# Patient Record
Sex: Female | Born: 1965 | Race: White | Hispanic: No | Marital: Married | Smoking: Former smoker
Health system: Southern US, Community
[De-identification: ages and names within clinical notes are randomized; demographics above are authoritative.]

## PROBLEM LIST (undated history)

## (undated) DIAGNOSIS — R945 Abnormal results of liver function studies: Principal | ICD-10-CM

## (undated) DIAGNOSIS — I2510 Atherosclerotic heart disease of native coronary artery without angina pectoris: Secondary | ICD-10-CM

## (undated) DIAGNOSIS — E785 Hyperlipidemia, unspecified: Secondary | ICD-10-CM

## (undated) DIAGNOSIS — A630 Anogenital (venereal) warts: Secondary | ICD-10-CM

## (undated) DIAGNOSIS — K635 Polyp of colon: Secondary | ICD-10-CM

## (undated) DIAGNOSIS — G43909 Migraine, unspecified, not intractable, without status migrainosus: Secondary | ICD-10-CM

## (undated) DIAGNOSIS — F329 Major depressive disorder, single episode, unspecified: Secondary | ICD-10-CM

## (undated) DIAGNOSIS — T7840XA Allergy, unspecified, initial encounter: Secondary | ICD-10-CM

## (undated) DIAGNOSIS — I10 Essential (primary) hypertension: Secondary | ICD-10-CM

## (undated) DIAGNOSIS — R7303 Prediabetes: Secondary | ICD-10-CM

## (undated) DIAGNOSIS — F32A Depression, unspecified: Secondary | ICD-10-CM

## (undated) HISTORY — DX: Anogenital (venereal) warts: A63.0

## (undated) HISTORY — DX: Allergy, unspecified, initial encounter: T78.40XA

## (undated) HISTORY — DX: Hyperlipidemia, unspecified: E78.5

## (undated) HISTORY — DX: Migraine, unspecified, not intractable, without status migrainosus: G43.909

## (undated) HISTORY — PX: HAMMER TOE SURGERY: SHX385

## (undated) HISTORY — DX: Major depressive disorder, single episode, unspecified: F32.9

## (undated) HISTORY — PX: FOOT SURGERY: SHX648

## (undated) HISTORY — DX: Depression, unspecified: F32.A

## (undated) HISTORY — DX: Prediabetes: R73.03

## (undated) HISTORY — DX: Atherosclerotic heart disease of native coronary artery without angina pectoris: I25.10

## (undated) HISTORY — DX: Polyp of colon: K63.5

## (undated) HISTORY — DX: Essential (primary) hypertension: I10

## (undated) HISTORY — PX: CHOLECYSTECTOMY: SHX55

---
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
## 2005-05-09 ENCOUNTER — Emergency Department: Payer: Self-pay | Admitting: Emergency Medicine

## 2005-05-19 ENCOUNTER — Emergency Department: Payer: Self-pay | Admitting: General Practice

## 2006-01-03 ENCOUNTER — Emergency Department: Payer: Self-pay | Admitting: Emergency Medicine

## 2007-11-16 ENCOUNTER — Ambulatory Visit: Payer: Self-pay | Admitting: Internal Medicine

## 2007-11-16 DIAGNOSIS — G43109 Migraine with aura, not intractable, without status migrainosus: Secondary | ICD-10-CM | POA: Insufficient documentation

## 2007-11-16 DIAGNOSIS — E785 Hyperlipidemia, unspecified: Secondary | ICD-10-CM

## 2007-11-16 DIAGNOSIS — I10 Essential (primary) hypertension: Secondary | ICD-10-CM

## 2007-11-18 DIAGNOSIS — J309 Allergic rhinitis, unspecified: Secondary | ICD-10-CM | POA: Insufficient documentation

## 2007-11-18 LAB — CONVERTED CEMR LAB
AST: 20 units/L (ref 0–37)
Alkaline Phosphatase: 94 units/L (ref 39–117)
Basophils Absolute: 0.1 10*3/uL (ref 0.0–0.1)
Bilirubin, Direct: 0.1 mg/dL (ref 0.0–0.3)
Calcium: 9.3 mg/dL (ref 8.4–10.5)
Chloride: 102 meq/L (ref 96–112)
Creatinine, Ser: 0.7 mg/dL (ref 0.4–1.2)
GFR calc Af Amer: 119 mL/min
GFR calc non Af Amer: 99 mL/min
LDL Cholesterol: 140 mg/dL — ABNORMAL HIGH (ref 0–99)
MCHC: 34.3 g/dL (ref 30.0–36.0)
MCV: 87.8 fL (ref 78.0–100.0)
Platelets: 232 10*3/uL (ref 150–400)
Potassium: 4.7 meq/L (ref 3.5–5.1)
TSH: 2.52 microintl units/mL (ref 0.35–5.50)
Total Bilirubin: 0.7 mg/dL (ref 0.3–1.2)
Total CHOL/HDL Ratio: 4.8
Triglycerides: 89 mg/dL (ref 0–149)
VLDL: 18 mg/dL (ref 0–40)
WBC: 8.4 10*3/uL (ref 4.5–10.5)

## 2007-11-27 ENCOUNTER — Encounter: Admission: RE | Admit: 2007-11-27 | Discharge: 2007-11-27 | Payer: Self-pay | Admitting: Internal Medicine

## 2007-12-03 ENCOUNTER — Telehealth: Payer: Self-pay | Admitting: Internal Medicine

## 2008-02-08 ENCOUNTER — Encounter: Payer: Self-pay | Admitting: Internal Medicine

## 2008-02-25 ENCOUNTER — Telehealth: Payer: Self-pay | Admitting: Internal Medicine

## 2008-03-06 ENCOUNTER — Telehealth: Payer: Self-pay | Admitting: Internal Medicine

## 2008-05-09 ENCOUNTER — Encounter: Payer: Self-pay | Admitting: Internal Medicine

## 2008-05-09 ENCOUNTER — Ambulatory Visit: Payer: Self-pay | Admitting: Internal Medicine

## 2008-05-09 ENCOUNTER — Other Ambulatory Visit: Admission: RE | Admit: 2008-05-09 | Discharge: 2008-05-09 | Payer: Self-pay | Admitting: Internal Medicine

## 2008-05-09 DIAGNOSIS — R3915 Urgency of urination: Secondary | ICD-10-CM

## 2008-05-27 ENCOUNTER — Telehealth: Payer: Self-pay | Admitting: *Deleted

## 2008-11-14 ENCOUNTER — Encounter: Payer: Self-pay | Admitting: Internal Medicine

## 2008-11-14 ENCOUNTER — Ambulatory Visit: Payer: Self-pay | Admitting: Internal Medicine

## 2008-11-14 DIAGNOSIS — F4322 Adjustment disorder with anxiety: Secondary | ICD-10-CM

## 2008-12-03 ENCOUNTER — Encounter: Admission: RE | Admit: 2008-12-03 | Discharge: 2008-12-03 | Payer: Self-pay | Admitting: Internal Medicine

## 2008-12-15 ENCOUNTER — Telehealth: Payer: Self-pay | Admitting: *Deleted

## 2009-02-10 ENCOUNTER — Telehealth: Payer: Self-pay | Admitting: *Deleted

## 2009-05-19 ENCOUNTER — Ambulatory Visit: Payer: Self-pay | Admitting: Internal Medicine

## 2009-05-19 LAB — CONVERTED CEMR LAB
Basophils Relative: 0.6 % (ref 0.0–3.0)
Bilirubin Urine: NEGATIVE
Bilirubin, Direct: 0.1 mg/dL (ref 0.0–0.3)
Cholesterol: 206 mg/dL — ABNORMAL HIGH (ref 0–200)
Eosinophils Relative: 5 % (ref 0.0–5.0)
HDL: 35 mg/dL — ABNORMAL LOW (ref >39.00)
Lymphocytes Relative: 27.7 % (ref 12.0–46.0)
Lymphs Abs: 2 10*3/uL (ref 0.7–4.0)
MCHC: 33.5 g/dL (ref 30.0–36.0)
MCV: 88 fL (ref 78.0–100.0)
Monocytes Relative: 7 % (ref 3.0–12.0)
Nitrite: NEGATIVE
Platelets: 209 10*3/uL (ref 150.0–400.0)
Potassium: 4.6 meq/L (ref 3.5–5.1)
Protein, U semiquant: NEGATIVE
RBC: 4.41 M/uL (ref 3.87–5.11)
RDW: 12.6 % (ref 11.5–14.6)
Sodium: 140 meq/L (ref 135–145)
Specific Gravity, Urine: <1.005
Total CHOL/HDL Ratio: 6
Triglycerides: 149 mg/dL (ref 0.0–149.0)
WBC Urine, dipstick: NEGATIVE

## 2009-05-26 ENCOUNTER — Ambulatory Visit: Payer: Self-pay | Admitting: Internal Medicine

## 2009-05-26 ENCOUNTER — Other Ambulatory Visit: Admission: RE | Admit: 2009-05-26 | Discharge: 2009-05-26 | Payer: Self-pay | Admitting: Internal Medicine

## 2009-05-26 DIAGNOSIS — R739 Hyperglycemia, unspecified: Secondary | ICD-10-CM

## 2009-06-09 ENCOUNTER — Telehealth: Payer: Self-pay | Admitting: *Deleted

## 2009-09-14 ENCOUNTER — Telehealth: Payer: Self-pay | Admitting: *Deleted

## 2009-09-21 ENCOUNTER — Telehealth: Payer: Self-pay | Admitting: Internal Medicine

## 2009-09-21 ENCOUNTER — Encounter: Payer: Self-pay | Admitting: Internal Medicine

## 2009-09-22 ENCOUNTER — Telehealth (INDEPENDENT_AMBULATORY_CARE_PROVIDER_SITE_OTHER): Payer: Self-pay | Admitting: *Deleted

## 2009-10-01 ENCOUNTER — Ambulatory Visit: Payer: Self-pay | Admitting: Internal Medicine

## 2009-10-01 DIAGNOSIS — R0609 Other forms of dyspnea: Secondary | ICD-10-CM

## 2009-10-01 DIAGNOSIS — R0989 Other specified symptoms and signs involving the circulatory and respiratory systems: Secondary | ICD-10-CM

## 2009-10-01 DIAGNOSIS — R0789 Other chest pain: Secondary | ICD-10-CM

## 2009-10-01 LAB — CONVERTED CEMR LAB
BUN: 11 mg/dL (ref 6–23)
Basophils Absolute: 0 10*3/uL (ref 0.0–0.1)
Basophils Relative: 0.6 % (ref 0.0–3.0)
CO2: 30 meq/L (ref 19–32)
Calcium: 9 mg/dL (ref 8.4–10.5)
Chloride: 103 meq/L (ref 96–112)
Creatinine, Ser: 0.8 mg/dL (ref 0.4–1.2)
Eosinophils Absolute: 0.2 10*3/uL (ref 0.0–0.7)
GFR calc non Af Amer: 83.52 mL/min (ref >60)
Glucose, Bld: 98 mg/dL (ref 70–99)
Lymphocytes Relative: 24.9 % (ref 12.0–46.0)
MCHC: 34.8 g/dL (ref 30.0–36.0)
Monocytes Absolute: 0.5 10*3/uL (ref 0.1–1.0)
Neutro Abs: 5 10*3/uL (ref 1.4–7.7)
Platelets: 239 10*3/uL (ref 150.0–400.0)
Potassium: 4.4 meq/L (ref 3.5–5.1)
RBC: 4.33 M/uL (ref 3.87–5.11)
RDW: 13.2 % (ref 11.5–14.6)
Sed Rate: 29 mm/hr — ABNORMAL HIGH (ref 0–22)

## 2009-10-05 ENCOUNTER — Ambulatory Visit: Payer: Self-pay | Admitting: Internal Medicine

## 2009-10-06 ENCOUNTER — Encounter: Payer: Self-pay | Admitting: Internal Medicine

## 2009-10-07 ENCOUNTER — Telehealth: Payer: Self-pay | Admitting: Internal Medicine

## 2009-11-10 ENCOUNTER — Ambulatory Visit: Payer: Self-pay | Admitting: Internal Medicine

## 2009-11-10 LAB — CONVERTED CEMR LAB
Cholesterol: 217 mg/dL — ABNORMAL HIGH (ref 0–200)
GFR calc non Af Amer: 102.44 mL/min (ref >60)
HDL: 37.4 mg/dL — ABNORMAL LOW (ref >39.00)
Potassium: 4.9 meq/L (ref 3.5–5.1)
Sodium: 140 meq/L (ref 135–145)
Triglycerides: 253 mg/dL — ABNORMAL HIGH (ref 0.0–149.0)
VLDL: 50.6 mg/dL — ABNORMAL HIGH (ref 0.0–40.0)

## 2009-11-18 ENCOUNTER — Encounter: Admission: RE | Admit: 2009-11-18 | Discharge: 2009-11-18 | Payer: Self-pay | Admitting: Internal Medicine

## 2009-11-25 ENCOUNTER — Telehealth: Payer: Self-pay | Admitting: *Deleted

## 2009-12-01 ENCOUNTER — Ambulatory Visit: Payer: Self-pay | Admitting: Internal Medicine

## 2009-12-01 DIAGNOSIS — N3941 Urge incontinence: Secondary | ICD-10-CM

## 2009-12-01 LAB — CONVERTED CEMR LAB
Blood in Urine, dipstick: NEGATIVE
Ketones, urine, test strip: NEGATIVE
Nitrite: NEGATIVE
Protein, U semiquant: NEGATIVE
WBC Urine, dipstick: NEGATIVE

## 2009-12-03 ENCOUNTER — Encounter (INDEPENDENT_AMBULATORY_CARE_PROVIDER_SITE_OTHER): Payer: Self-pay | Admitting: *Deleted

## 2010-02-17 DIAGNOSIS — R198 Other specified symptoms and signs involving the digestive system and abdomen: Secondary | ICD-10-CM

## 2010-02-22 ENCOUNTER — Ambulatory Visit: Payer: Self-pay | Admitting: Internal Medicine

## 2010-02-22 ENCOUNTER — Encounter: Payer: Self-pay | Admitting: Internal Medicine

## 2010-02-22 DIAGNOSIS — K219 Gastro-esophageal reflux disease without esophagitis: Secondary | ICD-10-CM | POA: Insufficient documentation

## 2010-02-23 LAB — CONVERTED CEMR LAB: Tissue Transglutaminase Ab, IgA: 7.2 units (ref <20)

## 2010-03-22 ENCOUNTER — Encounter: Payer: Self-pay | Admitting: Internal Medicine

## 2010-04-05 ENCOUNTER — Ambulatory Visit: Payer: Self-pay | Admitting: Internal Medicine

## 2010-04-06 ENCOUNTER — Encounter: Payer: Self-pay | Admitting: Internal Medicine

## 2010-04-28 ENCOUNTER — Ambulatory Visit: Payer: Self-pay | Admitting: Internal Medicine

## 2010-04-28 LAB — CONVERTED CEMR LAB
AST: 27 units/L (ref 0–37)
Albumin: 3.9 g/dL (ref 3.5–5.2)
Alkaline Phosphatase: 88 units/L (ref 39–117)
Basophils Absolute: 0 10*3/uL (ref 0.0–0.1)
Basophils Relative: 0.7 % (ref 0.0–3.0)
CO2: 30 meq/L (ref 19–32)
Direct LDL: 142.5 mg/dL
GFR calc non Af Amer: 76.63 mL/min (ref >60)
Glucose, Bld: 113 mg/dL — ABNORMAL HIGH (ref 70–99)
HCT: 39 % (ref 36.0–46.0)
HDL: 37 mg/dL — ABNORMAL LOW (ref >39.00)
Hemoglobin, Urine: NEGATIVE
Hemoglobin: 13.4 g/dL (ref 12.0–15.0)
Lymphocytes Relative: 25.1 % (ref 12.0–46.0)
Lymphs Abs: 1.7 10*3/uL (ref 0.7–4.0)
MCHC: 34.5 g/dL (ref 30.0–36.0)
Monocytes Relative: 6.9 % (ref 3.0–12.0)
Neutro Abs: 4.2 10*3/uL (ref 1.4–7.7)
Nitrite: NEGATIVE
Potassium: 4.8 meq/L (ref 3.5–5.1)
RBC: 4.61 M/uL (ref 3.87–5.11)
RDW: 13.5 % (ref 11.5–14.6)
Sodium: 139 meq/L (ref 135–145)
Specific Gravity, Urine: <=1.005 (ref 1.000–1.030)
TSH: 3.31 microintl units/mL (ref 0.35–5.50)
Total Protein, Urine: NEGATIVE mg/dL
Total Protein: 6.8 g/dL (ref 6.0–8.3)
Urine Glucose: NEGATIVE mg/dL
pH: 6 (ref 5.0–8.0)

## 2010-05-04 ENCOUNTER — Other Ambulatory Visit
Admission: RE | Admit: 2010-05-04 | Discharge: 2010-05-04 | Payer: Self-pay | Source: Home / Self Care | Admitting: Internal Medicine

## 2010-05-04 ENCOUNTER — Ambulatory Visit: Payer: Self-pay | Admitting: Internal Medicine

## 2010-05-10 LAB — CONVERTED CEMR LAB: Pap Smear: NEGATIVE

## 2010-06-06 DIAGNOSIS — K635 Polyp of colon: Secondary | ICD-10-CM

## 2010-06-06 HISTORY — DX: Polyp of colon: K63.5

## 2010-07-06 NOTE — Assessment & Plan Note (Signed)
Summary: CPX AND PAP/CJR   Vital Signs:  Patient profile:   45 year old female Menstrual status:  regular LMP:     04/19/2010 Height:      63.75 inches Weight:      268 pounds BMI:     46.53 Pulse rate:   60 / minute BP sitting:   110 / 60  (right arm) Cuff size:   large  Vitals Entered By: Romualdo Bolk, CMA Duncan Dull) (May 04, 2010 9:56 AM)  Nutrition Counseling: Patient's BMI is greater than 25 and therefore counseled on weight management options. CC: CPX with pap LMP (date): 04/19/2010 LMP - Character: normal Menarche (age onset years): 14   Menses interval (days): 25 Menstrual flow (days): 3 Enter LMP: 04/19/2010 Last PAP Result NEGATIVE FOR INTRAEPITHELIAL LESIONS OR MALIGNANCY.   History of Present Illness: Elizabeth Fleming comes in today  for preventive visit.  Since last visit she has less job stress and working from home.  trying to exercise and lose weight and has taken some measure to that effect .  GERD HT controlled  Urinary : neg exam by uro  on meds   ? diet change helpful also  Preventive Care Screening  Prior Values:    Pap Smear:  NEGATIVE FOR INTRAEPITHELIAL LESIONS OR MALIGNANCY. (05/26/2009)    Mammogram:  ASSESSMENT: Negative - BI-RADS 1^MM DIGITAL SCREENING (11/18/2009)    Colonoscopy:  DONE (04/05/2010)    Last Tetanus Booster:  Td (05/11/2005)   Preventive Screening-Counseling & Management  Alcohol-Tobacco     Alcohol drinks/day: 0     Smoking Status: quit     Year Quit: 1 yr 4 months  Caffeine-Diet-Exercise     Caffeine use/day: 4     Does Patient Exercise: no  Hep-HIV-STD-Contraception     Dental Visit-last 6 months yes     Sun Exposure-Excessive: no  Safety-Violence-Falls     Seat Belt Use: yes     Firearms in the Home: firearms in the home     Firearm Counseling: not indicated; uses recommended firearm safety measures     Smoke Detectors: yes  Current Medications (verified): 1)  Atenolol 25 Mg  Tabs (Atenolol) .Marland Kitchen.. 1 By  Mouth Once Daily 2)  Alprazolam 0.25 Mg  Tabs (Alprazolam) .... 1/2  To 1 By Mouth Two Times A Day As Needed For Anxiety 3)  Zyrtec Allergy 10 Mg Caps (Cetirizine Hcl) .... One Tablet By Mouth Once Daily 4)  Maalox 600 Mg Chew (Calcium Carbonate Antacid) .... Two By Mouth Once Daily 5)  Bentyl 20 Mg Tabs (Dicyclomine Hcl) .... Take 1 Tablet By Mouth Two Times A Day 6)  Toviaz 4 Mg Xr24h-Tab (Fesoterodine Fumarate) .Marland Kitchen.. 1 By Mouth Once Daily  Allergies (verified): 1)  ! Iodine  Past History:  Past medical, surgical, family and social histories (including risk factors) reviewed, and no changes noted (except as noted below).  Past Medical History: Hypertension g2p0 hx of abnormal pap 10years ago but normal colopscopy.  hx of seasonal depression   prozac x 1-2 years hx migraines and gets lov one eye but no common  rx in past with? epidrine? MIDRIN   only 2 episodes.    hx of episode of cns change  without medical evaluation that was short lived.   (didnt have insurance)wonders if was a TIA butno numbness weakness or dysarthria  Allergic rhinitis  Remote hx of  allergies in [place].  PFTS Chest x ray normal 2011 colon polyps 2012 on 5 yr  recal  Past Surgical History: Reviewed history from 11/16/2007 and no changes required. Cholecystectomy 1998 Tonsillectomy 1974  Past History:  Care Management:  Counseling-no longer seeing Dermatology: [facility]  Urology: [facility]: [facility]   Family History: Reviewed history from 02/17/2010 and no changes required. ADOPTED  not available  Social History: Reviewed history from 02/22/2010 and no changes required. 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  Alcohol Use - no Daily Caffeine Use: 3 daily   Review of Systems  The patient  denies anorexia, fever, weight loss, vision loss, decreased hearing, hoarseness, chest pain, syncope, dyspnea on exertion, peripheral edema, prolonged cough, hemoptysis, melena, hematochezia, severe indigestion/heartburn, unusual weight change, abnormal bleeding, enlarged lymph nodes, angioedema, and breast masses.         less urinary frequency   Physical Exam  General:  Well-developed,well-nourished,in no acute distress; alert,appropriate and cooperative throughout examination Head:  Normocephalic and atraumatic without obvious abnormalities. No apparent alopecia or balding. Eyes:  PERRL, EOMs full, conjunctiva clear  Ears:  R ear normal, L ear normal, and no external deformities.   Nose:  no external deformity, no external erythema, and no nasal discharge.   Mouth:  good dentition and pharynx pink and moist.   Neck:  No deformities, masses, or tenderness noted. Breasts:  No mass, nodules, thickening, tenderness, bulging, retraction, inflamation, nipple discharge or skin changes noted.   Lungs:  Normal respiratory effort, chest expands symmetrically. Lungs are clear to auscultation, no crackles or wheezes. Heart:  Normal rate and regular rhythm. S1 and S2 normal without gallop, murmur, click, rub or other extra sounds.no lifts.   Abdomen:  Bowel sounds positive,abdomen soft and non-tender without masses, organomegaly or hernias noted. Rectal:  per DR Theone Murdoch:  Pelvic Exam:        External: normal female genitalia without lesions or masses        Vagina: normal without lesions or masses        Cervix: normal without lesions or masses        Adnexa: normal bimanual exam without masses or fullness        Uterus: normal by palpation        Pap smear: performed Msk:  no joint warmth, no redness over joints, and no joint deformities.   Pulses:  pulses intact without delay   Extremities:  no clubbing cyanosis or edema  Neurologic:  alert & oriented X3, strength normal in all  extremities, and gait normal.  non f ocal  Skin:  turgor normal, color normal, no suspicious lesions, and no ecchymoses.   Cervical Nodes:  No lymphadenopathy noted Axillary Nodes:  No palpable lymphadenopathy Inguinal Nodes:  No significant adenopathy Psych:  Normal eye contact, appropriate affect. Cognition appears normal.    Impression & Recommendations:  Problem # 1:  PREVENTIVE HEALTH CARE (ICD-V70.0) Discussed nutrition,exercise,diet,healthy weight, vitamin D and calcium.    Problem # 2:  ROUTINE GYNECOLOGICAL EXAM (ICD-V72.31)  pap done  Orders: Pap Smear, Thin Prep ( Collection of) (E4540)  Problem # 3:  HYPERGLYCEMIA, FASTING (ICD-790.29) ongoing  counseled about lifestyle intervention  Labs Reviewed: Creat: 0.9 (04/28/2010)     Problem # 4:  HYPERLIPIDEMIA (ICD-272.4) disc lifestyle intervention  Labs Reviewed: SGOT: 27 (04/28/2010)   SGPT: 32 (  04/28/2010)  Prior 10 Yr Risk Heart Disease: 3 % (12/01/2009)   HDL:37.00 (04/28/2010), 37.40 (11/10/2009)  LDL:140 (11/16/2007)  Chol:215 (04/28/2010), 217 (11/10/2009)  Trig:200.0 (04/28/2010), 253.0 (11/10/2009)  Problem # 5:  GERD (ICD-530.81)  Her updated medication list for this problem includes:    Maalox 600 Mg Chew (Calcium carbonate antacid) .Marland Kitchen..Marland Kitchen Two by mouth once daily    Bentyl 20 Mg Tabs (Dicyclomine hcl) .Marland Kitchen... Take 1 tablet by mouth two times a day  Problem # 6:  OBESITY (ICD-278.00)  counseled  pt wants to do things on her own step at a time rather than doing weight watchers or other prograams  Ht: 63.75 (05/04/2010)   Wt: 268 (05/04/2010)   BMI: 46.53 (05/04/2010)  Problem # 7:  HYPERTENSION (ICD-401.9)  Her updated medication list for this problem includes:    Atenolol 25 Mg Tabs (Atenolol) .Marland Kitchen... 1 by mouth once daily  BP today: 110/60 Prior BP: 132/80 (02/22/2010)  Prior 10 Yr Risk Heart Disease: 3 % (12/01/2009)  Labs Reviewed: K+: 4.8 (04/28/2010) Creat: : 0.9 (04/28/2010)   Chol: 215  (04/28/2010)   HDL: 37.00 (04/28/2010)   LDL: 140 (11/16/2007)   TG: 200.0 (04/28/2010)  Problem # 8:  URINARY URGENCY (LOV-564.33) has seen  urology and no bladder prolapsing  Complete Medication List: 1)  Atenolol 25 Mg Tabs (Atenolol) .Marland Kitchen.. 1 by mouth once daily 2)  Alprazolam 0.25 Mg Tabs (Alprazolam) .... 1/2  to 1 by mouth two times a day as needed for anxiety 3)  Zyrtec Allergy 10 Mg Caps (Cetirizine hcl) .... One tablet by mouth once daily 4)  Maalox 600 Mg Chew (Calcium carbonate antacid) .... Two by mouth once daily 5)  Bentyl 20 Mg Tabs (Dicyclomine hcl) .... Take 1 tablet by mouth two times a day 6)  Toviaz 4 Mg Xr24h-tab (Fesoterodine fumarate) .Marland Kitchen.. 1 by mouth once daily  Patient Instructions: 1)  You will be informed of lab results when available.  2)  weight loss " mediterranean diet "    will help your blood sugar and losing weight.  3)  continue   lifestyle intervention  4)  At next cpx will do labs and Hg a 1c    dx  hyperglycemia Prescriptions: ATENOLOL 25 MG  TABS (ATENOLOL) 1 by mouth once daily  #90 Tablet x 3   Entered and Authorized by:   Madelin Headings MD   Signed by:   Madelin Headings MD on 05/04/2010   Method used:   Electronically to        [facility]ail[facility]       [address]       [place], [place]  [address]       Ph: [phone]       Fax: [phone]   RxID:   [id number]    Orders Added: 1)  Est. Patient 40-64 years [99396] 2)  Pap Smear, Thin Prep ( Collection of) [Q0091] 3)  Est. Patient Level II [28315]

## 2010-07-06 NOTE — Progress Notes (Signed)
Summary: refill on naftin cream  Phone Note Call from Patient Call back at Work Phone [phone]   Caller: Patient Summary of Call: Pt called saying that she needs a rx for naftin. She said that we were suppose to refill this at her visit. Can we give her a refill? Initial call taken by: Romualdo Bolk, CMA (AAMA),  June 09, 2009 5:24 PM  Follow-up for Phone Call        per Dr. Fabian Sharp- ok x 3 refills. Follow-up by: Romualdo Bolk, CMA Duncan Dull),  June 09, 2009 5:49 PM  Additional Follow-up for Phone Call Additional follow up Details #1::        Rx sent to pharmacy. Additional Follow-up by: Romualdo Bolk, CMA (AAMA),  June 10, 2009 9:28 AM    Prescriptions: NAFTIN 1 % CREA (NAFTIFINE HCL) use as directed  #30 x 2   Entered by:   Romualdo Bolk, CMA (AAMA)   Authorized by:   Madelin Headings MD   Signed by:   Romualdo Bolk, CMA (AAMA) on 06/10/2009   Method used:   Electronically to        [facility] [address] (retail)       [address]       [place], [place]  [address]       Ph: [phone]       Fax: [phone]   RxID:   [id number]

## 2010-07-06 NOTE — Letter (Signed)
Summary: [facility] Instructions  [facility]  [address] [place], [place] [address]   Phone: [phone]  Fax: [phone]       Elizabeth Fleming    11-13-1965    MRN: [id number]       Procedure Day /Date: Monday 04/05/10     Arrival Time: 10:30 am     Procedure Time: 11:30 am     Location of Procedure:                    _x _  [facility] (4th Floor)  PREPARATION FOR COLONOSCOPY WITH MIRALAX  Starting 5 days prior to your procedure (04/01/10) do not eat nuts, seeds, popcorn, corn, beans, peas,  salads, or any raw vegetables.  Do not take any fiber supplements (e.g. Metamucil, Citrucel, and Benefiber). ____________________________________________________________________________________________________   THE DAY BEFORE YOUR PROCEDURE         DATE: Sunday DAY: 04/04/10  1   Drink clear liquids the entire day-NO SOLID FOOD  2   Do not drink anything colored red or purple.  Avoid juices with pulp.  No orange juice.  3   Drink at least 64 oz. (8 glasses) of fluid/clear liquids during the day to prevent dehydration and help the prep work efficiently.  CLEAR LIQUIDS INCLUDE: Water Jello Ice Popsicles Tea (sugar ok, no milk/cream) Powdered fruit flavored drinks Coffee (sugar ok, no milk/cream) Gatorade Juice: apple, white grape, white cranberry  Lemonade Clear bullion, consomm, broth Carbonated beverages (any kind) Strained chicken noodle soup Hard Candy  4   Mix the entire bottle of Miralax with 64 oz. of Gatorade/Powerade in the morning and put in the refrigerator to chill.  5   At 3:00 pm take 2 Dulcolax/Bisacodyl tablets.  6   At 4:30 pm take one Reglan/Metoclopramide tablet.  7  Starting at 5:00 pm drink one 8 oz glass of the Miralax mixture every 15-20 minutes until you have finished drinking the entire 64 oz.  You should finish drinking prep around 7:30 or 8:00 pm.  8   If you are nauseated, you may take the 2nd Reglan/Metoclopramide  tablet at 6:30 pm.        9    At 8:00 pm take 2 more DULCOLAX/Bisacodyl tablets.         THE DAY OF YOUR PROCEDURE      DATE:  04/05/10 DAY: Monday  You may drink clear liquids until 9:30 am  (2 HOURS BEFORE PROCEDURE).   MEDICATION INSTRUCTIONS  Unless otherwise instructed, you should take regular prescription medications with a small sip of water as early as possible the morning of your procedure.        OTHER INSTRUCTIONS  You will need a responsible adult at least 42 years of age to accompany you and drive you home.   This person must remain in the waiting room during your procedure.  Wear loose fitting clothing that is easily removed.  Leave jewelry and other valuables at home.  However, you may wish to bring a book to read or an iPod/MP3 player to listen to music as you wait for your procedure to start.  Remove all body piercing jewelry and leave at home.  Total time from sign-in until discharge is approximately 2-3 hours.  You should go home directly after your procedure and rest.  You can resume normal activities the day after your procedure.  The day of your procedure you should not:   Drive  Make legal decisions   Operate machinery   Drink alcohol   Return to work  You will receive specific instructions about eating, activities and medications before you leave.   The above instructions have been reviewed and explained to me by   Lamona Curl CMA Duncan Dull)  February 22, 2010 10:34 AM     I fully understand and can verbalize these instructions _____________________________ Date 02/22/10

## 2010-07-06 NOTE — Letter (Signed)
Summary: [facility]  [facility]   Imported By: Maryln Gottron 03/29/2010 12:28:26  _____________________________________________________________________  External Attachment:    Type:   Image     Comment:   External Document

## 2010-07-06 NOTE — Progress Notes (Signed)
Summary: Pt checking on status of alprazalam refill. Pls call in today.  Phone Note Call from Patient Call back at [facility] Phone [phone]   Caller: Patient Summary of Call: Pt called to check on status of alprazolam refill. Please call in to [facility] [address] [place] [phone]  Initial call taken by: Lucy Antigua,  September 14, 2009 2:58 PM  Follow-up for Phone Call        See message from pharmacy. I tried to call pt back but there was no answer and no way to leave a message. Follow-up by: Romualdo Bolk, CMA Duncan Dull),  September 14, 2009 3:05 PM  Additional Follow-up for Phone Call Additional follow up Details #1::        Rx called in. I tried to call pt back but was unable to leave a message about rx being called in. Additional Follow-up by: Romualdo Bolk, CMA (AAMA),  September 14, 2009 5:01 PM

## 2010-07-06 NOTE — Letter (Signed)
Summary: Patient Notice- Polyp Results  [facility]  [address] [place], [place] [address]   Phone: [phone]  Fax: [phone]        April 06, 2010 MRN: [id number]    Elizabeth Fleming [address] [place], [place]  [address]    Dear [name],  I am pleased to inform you that the colon polyp(s) removed during your recent colonoscopy was (were) found to be benign (no cancer detected) upon pathologic examination.The polyp was an adenoma ( precancerous)  I recommend you have a repeat colonoscopy examination in _5  years to look for recurrent polyps, as having colon polyps increases your risk for having recurrent polyps or even colon cancer in the future.  Should you develop new or worsening symptoms of abdominal pain, bowel habit changes or bleeding from the rectum or bowels, please schedule an evaluation with either your primary care physician or with me.  Additional information/recommendations:  _x_ No further action with gastroenterology is needed at this time. Please      follow-up with your primary care physician for your other healthcare      needs.  __ Please call [phone] to schedule a return visit to review your      situation.  __ Please keep your follow-up visit as already scheduled.  __ Continue treatment plan as outlined the day of your exam.  Please call us if you are having persistent problems or have questions about your condition that have not been fully answered at this time.  Sincerely,  Hart Carwin MD  This letter has been electronically signed by your physician.  Appended Document: Patient Notice- Polyp Results letter mailed

## 2010-07-06 NOTE — Progress Notes (Signed)
Summary: chest pain  Phone Note Call from Patient   Caller: Patient Call For: Madelin Headings MD Summary of Call: Pt called in to see Dr. Fabian Sharp with complaints of SOB and chest pain when making any movement.  Advised to to ER ASAP Initial call taken by: Lynann Beaver CMA,  September 21, 2009 1:18 PM

## 2010-07-06 NOTE — Letter (Signed)
Summary: [facility]   [facility]   Imported By: Maryln Gottron 09/24/2009 10:59:32  _____________________________________________________________________  External Attachment:    Type:   Image     Comment:   External Document

## 2010-07-06 NOTE — Assessment & Plan Note (Signed)
Summary: problems w/BM--ch.   History of Present Illness Visit Type: new patient  Primary GI MD: Lina Sar MD Primary Maxtyn Nuzum: Berniece Andreas, MD Requesting Delvina Mizzell: na Chief Complaint: GERD, bloating, diarrhea, rectal pain, and loss of appetite  History of Present Illness:   This is a 45 year old white female with a 3 month history of frequent stools ranging from solid to loose and occurring from the morning to evening but usually not at night. Her urinary frequency has also increased. She has noticed small amounts of bright red blood on the toilet tissue but not in the stool. Her usual bowel habits are 2 or 3 bowel movements a day. Currently, she is having 5-6 bowel movements daily. She denies any excessive stress. Her job requires her to sit most of the day while talking on the phone. She has short breaks to go to the bathroom. Patient is adopted so there is no family history. She had an open cholecystectomy at age 81 for cholelithiasis.   GI Review of Systems    Reports acid reflux, bloating, heartburn, loss of appetite, and  nausea.      Denies abdominal pain, belching, chest pain, dysphagia with liquids, dysphagia with solids, vomiting, vomiting blood, weight loss, and  weight gain.      Reports change in bowel habits, diarrhea, hemorrhoids, irritable bowel syndrome, and  rectal pain.     Denies anal fissure, black tarry stools, constipation, diverticulosis, fecal incontinence, heme positive stool, jaundice, light color stool, liver problems, and  rectal bleeding.    Current Medications (verified): 1)  Atenolol 25 Mg  Tabs (Atenolol) .Marland Kitchen.. 1 By Mouth Once Daily 2)  Alprazolam 0.25 Mg  Tabs (Alprazolam) .... 1/2  To 1 By Mouth Two Times A Day As Needed For Anxiety 3)  Zyrtec Allergy 10 Mg Caps (Cetirizine Hcl) .... One Tablet By Mouth Once Daily 4)  Maalox 600 Mg Chew (Calcium Carbonate Antacid) .... Two By Mouth Once Daily  Allergies (verified): 1)  ! Iodine  Past  History:  Past Medical History: Reviewed history from 12/01/2009 and no changes required. Hypertension g2p0 hx of abnormal pap 10years ago but normal colopscopy.  hx of seasonal depression   prozac x 1-2 years hx migraines and gets lov one eye but no common  rx in past with? epidrine? MIDRIN   only 2 episodes.    hx of episode of cns change  without medical evaluation that was short lived.   (didnt have insurance)wonders if was a TIA butno numbness weakness or dysarthria  Allergic rhinitis  Remote hx of  allergies in [place].  PFTS Chest x ray normal 2011  Past Surgical History: Reviewed history from 11/16/2007 and no changes required. Cholecystectomy 1998 Tonsillectomy 1974  Family History: Reviewed history from 02/17/2010 and no changes required. 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  Alcohol Use - no Daily Caffeine Use: 3 daily   Review of Systems       The patient complains of allergy/sinus, back pain, shortness of breath, swelling of feet/legs, urination - excessive, and urine leakage.  The patient denies anemia, anxiety-new, arthritis/joint pain, blood in urine, breast changes/lumps, change in vision, confusion, cough, coughing up blood, depression-new, fainting, fatigue, fever, headaches-new, hearing problems, heart murmur, heart rhythm  changes, itching, menstrual pain, muscle pains/cramps, night sweats, nosebleeds, pregnancy symptoms, skin rash, sore throat, swollen lymph glands, thirst - excessive , urination - excessive , urination changes/pain, vision changes, and voice change.         Pertinent positive and negative review of systems were noted in the above HPI. All other ROS was otherwise negative.   Vital Signs:  Patient profile:   45 year old female Menstrual  status:  regular Height:      64 inches Weight:      270 pounds BMI:     46.51 BSA:     2.22 Pulse rate:   80 / minute Pulse rhythm:   regular BP sitting:   132 / 80  (left arm) Cuff size:   large  Vitals Entered By: Ok Anis CMA (February 22, 2010 9:42 AM)  Physical Exam  General:  alert, oriented and in no distress. Overweight. Eyes:  nonicteric. Mouth:  no aphthous ulcers. Neck:  thyroid normal, no adenopathy. Lungs:  Clear throughout to auscultation. Heart:  Regular rate and rhythm; no murmurs, rubs,  or bruits. Abdomen:  obese soft abdomen with normoactive bowel sounds. Minimal tenderness on deep pressure and left lower quadrant but essentially unremarkable abdomen. Liver edge at costal margin. Well-healed post cholecystectomy scar in right upper quadrant. Rectal:  normal rectal sphincter tone with soft Hemoccult negative stool. Extremities:  No clubbing, cyanosis, edema or deformities noted. Psych:  Alert and cooperative. Normal mood and affect.   Impression & Recommendations:  Problem # 1:  GERD (ICD-530.81) Patient has had a recent exacerbation of indigestion and reflux for which she takes Maalox.  Problem # 2:  CHANGE IN BOWELS (ZOX-096.04) Patient has had a change in bowel habits from 2-3 bowel movements a day to 5-6 bowel movements a day with occasional blood. We may be dealing with irritable bowel syndrome versus inflammatory bowel disease. Her sedimentation rate has been elevated at 29. She is Hemoccult negative on my exam today. We need to rule out microscopic colitis. She may have postcholecystectomy diarrhea which may be a contributing factor. We will start her on Bentyl 20 mg p.o. b.i.d. and schedule a colonoscopy.  Orders: TLB-TSH (Thyroid Stimulating Hormone) (84443-TSH) T-Tissue Transglutamase Ab IgA (54098-11914) Colonoscopy (Colon)  Problem # 3:  OBESITY (ICD-278.00) weight has been stable although her appetite has been decreased  Patient  Instructions: 1)  colonoscopy with biopsies to rule out microscopic colitis. 2)  Hold Maalox since it causes diarrhea. 3)  Bentyl 20 mg p.o. b.i.d. 4)  TSH and tissue transglutaminase. 5)  Copy sent to : Dr Coral Ceo 6)  The medication list was reviewed and reconciled.  All changed / newly prescribed medications were explained.  A complete medication list was provided to the patient / caregiver. Prescriptions: BENTYL 20 MG TABS (DICYCLOMINE HCL) Take 1 tablet by mouth two times a day  #60 x 1   Entered by:   Lamona Curl CMA (AAMA)   Authorized by:   Hart Carwin MD   Signed by:   Lamona Curl CMA (AAMA) on 02/22/2010   Method used:   Electronically to        [facility] [address] (retail)       [address], [address]       [place], [place]  [address]       Ph: [phone]       Fax: [phone]   RxID:  [id number] DULCOLAX 5 MG  TBEC (BISACODYL) Day before procedure take 2 at 3pm and 2 at 8pm.  #4 x 0   Entered by:   Lamona Curl CMA (AAMA)   Authorized by:   Hart Carwin MD   Signed by:   Lamona Curl CMA (AAMA) on 02/22/2010   Method used:   Electronically to        [facility] [address] (retail)       [address]       [place], [place]  [address]       Ph: [phone]       Fax: [phone]   RxID:   [id number] REGLAN 10 MG  TABS (METOCLOPRAMIDE HCL) As per prep instructions.  #2 x 0   Entered by:   Lamona Curl CMA (AAMA)   Authorized by:   Hart Carwin MD   Signed by:   Lamona Curl CMA (AAMA) on 02/22/2010   Method used:   Electronically to        [facility] [address] (retail)       [address], [address]       [place], [place]  [address]       Ph: [phone]       Fax: [phone]   RxID:   [id number] MIRALAX   POWD (POLYETHYLENE GLYCOL 3350) As per prep  instructions.  #255gm x 0   Entered by:    Lamona Curl CMA (AAMA)   Authorized by:   Hart Carwin MD   Signed by:   Lamona Curl CMA (AAMA) on 02/22/2010   Method used:   Electronically to        [facility] [address] (retail)       [address]       [place], [place]  [address]       Ph: [phone]       Fax: [phone]   RxID:   [id number]

## 2010-07-06 NOTE — Procedures (Signed)
Summary: Colonoscopy  Patient: Elizabeth Fleming Note: All result statuses are Final unless otherwise noted.  Tests: (1) Colonoscopy (COL)   COL Colonoscopy           DONE (C)     [facility]     [address]     [place], [place]  [address]           COLONOSCOPY PROCEDURE REPORT           PATIENT:  Elizabeth Fleming, Elizabeth Fleming  MR#:  [id number]     BIRTHDATE:  1966/03/08, 44 yrs. old  GENDER:  female     ENDOSCOPIST:  Hedwig Morton. Juanda Chance, MD     REF. BY:  Neta Mends. Panosh, M.D.     PROCEDURE DATE:  04/05/2010     PROCEDURE:  Colonoscopy 19147     ASA CLASS:  Class II     INDICATIONS:  unexplained diarrhea, change in bowel habits,     hematochezia elevated sed.rate, 5-6 BM's /day, low volume     hematochezia     pt is adopted     MEDICATIONS:   Versed 5 mg, Fentanyl 50 mcg           DESCRIPTION OF PROCEDURE:   After the risks benefits and     alternatives of the procedure were thoroughly explained, informed     consent was obtained.  Digital rectal exam was performed and     revealed no rectal masses.   The LB [id number]-[id number] endoscope     was introduced through the anus and advanced to the terminal ileum     which was intubated for a short distance, without limitations.     The quality of the prep was excellent, using MiraLax.  The     instrument was then slowly withdrawn as the colon was fully     examined.     <<PROCEDUREIMAGES>>     FINDINGS:  A sessile polyp was found. 15 mm sessile polyp at 15 cm     Polyps were snared, then cauterized with monopolar cautery.     Retrieval was successful (see image6, image7, image8, and image9).     snare polyp polyp remioved in 4 pieces  This was otherwise a     normal examination of the colon and terrminal ileum. Random     biopsies were obtained  from TI and the colonand sent to pathology     (see image10, image5, image4, image3, image2, and image1). r/o     microscopic colitis   Retroflexed views in the rectum revealed     no abnormalities.     The scope was then withdrawn from the patient     and the procedure completed.           COMPLICATIONS:  None     ENDOSCOPIC IMPRESSION:     1) Sessile polyp     2) Otherwise normal examination to TI, s/p random biopsies of     the TI and colon     s/p hot snare polypectomy     RECOMMENDATIONS:     1) Await pathology results     2) Await biopsy results     begin Bentyl 20 mg po bid, already called in on 02/22/2010     hold ASA x 2 weeks           REPEAT EXAM:  In 5 year(s) for.           ______________________________  Hedwig Morton. Juanda Chance, MD           CC:           n.     REVISED:  04/05/2010 12:31 PM     eSIGNED:   Hedwig Morton. Brodie at 04/05/2010 12:31 PM           Page 2 of 3   [place], [place], [id number]  Note: An exclamation mark (!) indicates a result that was not dispersed into the flowsheet. Document Creation Date: 04/05/2010 12:32 PM _______________________________________________________________________  (1) Order result status: Final Collection or observation date-time: 04/05/2010 12:06 Requested date-time:  Receipt date-time:  Reported date-time:  Referring Physician:   Ordering Physician: Lina Sar [id number]) Specimen Source:  Source: Launa Grill Order Number: [id number] Lab site:   Appended Document: Colonoscopy     Procedures Next Due Date:    Colonoscopy: 04/2015

## 2010-07-06 NOTE — Progress Notes (Signed)
Summary: nosebleeds x 2   LMTCB 10/08/2009  Phone Note Call from Patient Call back at Work Phone [phone]   Caller: Patient Summary of Call: Pt called saying that she took last dose of prednisone on 5/3 and she had 2 nosebleeds at 6:30am and 7:00am. Left nostril on both sides. Pt had to pack if for 5-7 mins. No other symptoms besides the nose bleeds.  Initial call taken by: Romualdo Bolk, CMA (AAMA),  Oct 07, 2009 11:03 AM  Follow-up for Phone Call        dont think the pred is related to this  Your PFTS are normal. No asthma seen  . If this shortness of breat is continuing  we should next get an echo test of her heart  to rule out elevated lung pressures etc.  then ROV  Follow-up by: Madelin Headings MD,  Oct 07, 2009 11:21 PM  Additional Follow-up for Phone Call Additional follow up Details #1::        [facility] Additional Follow-up by: Lynann Beaver CMA,  Oct 08, 2009 8:16 AM    Additional Follow-up for Phone Call Additional follow up Details #2::    Pt. notified. Follow-up by: Lynann Beaver CMA,  Oct 08, 2009 8:24 AM

## 2010-07-06 NOTE — Assessment & Plan Note (Signed)
Summary: 6 mo rov/mm rsc bmp/njr   Vital Signs:  Patient profile:   45 year old female Menstrual status:  regular LMP:     11/23/2009 Height:      64 inches Weight:      267 pounds BMI:     46.00 Pulse rate:   66 / minute BP sitting:   110 / 80  (right arm) Cuff size:   large  Vitals Entered By: Romualdo Bolk, CMA (AAMA) (December 01, 2009 8:03 AM)  Nutrition Counseling: Patient's BMI is greater than 25 and therefore counseled on weight management options. CC: Follow-up visit on labs, Hypertension Management LMP (date): 11/23/2009 LMP - Character: normal Menarche (age onset years): 14   Menses interval (days): 25 Menstrual flow (days): 3 Enter LMP: 11/23/2009 Last PAP Result NEGATIVE FOR INTRAEPITHELIAL LESIONS OR MALIGNANCY.   History of Present Illness: Elizabeth Fleming comes in today  for follow up of a number of issues : CP   mid discomfort when going up stairs  SOB  se above  had pfts and no wheezing now .   Anxiety using xanax two times a day and helping.   New problem,:UI and bowel incontinenece.    ?prolonged from  cycle.   Urgency  incontinence  recently . Also some diarrhea     some blood ? on tissue   not mixed in       signs for about a month and not getting better . NO travel fever or GE  episodes  She says   drinking water causes increase   in urinary problem but the 3 diet caffiene sodas per day dont.  No change in diet and no juices .  Hypertension History:      She complains of dyspnea with exertion and peripheral edema, but denies headache, chest pain, palpitations, orthopnea, PND, visual symptoms, neurologic problems, syncope, and side effects from treatment.  She notes no problems with any antihypertensive medication side effects.  Rt foot swelling.        Positive major cardiovascular risk factors include hyperlipidemia and hypertension.  Negative major cardiovascular risk factors include female age less than 45 years old and non-tobacco-user status.      Preventive Screening-Counseling & Management  Alcohol-Tobacco     Alcohol drinks/day: 0     Smoking Status: quit     Year Quit: 1 yr 4 months  Caffeine-Diet-Exercise     Caffeine use/day: 4     Does Patient Exercise: no  Comments:     Current Medications (verified): 1)  Atenolol 25 Mg  Tabs (Atenolol) .Marland Kitchen.. 1 By Mouth Once Daily 2)  Alprazolam 0.25 Mg  Tabs (Alprazolam) .... 1/2  To 1 By Mouth Two Times A Day As Needed For Anxiety 3)  Naftin 1 % Crea (Naftifine Hcl) .... Use As Directed 4)  Ventolin Hfa 108 (90 Base) Mcg/act Aers (Albuterol Sulfate)  Allergies (verified): 1)  ! Iodine  Past History:  Past medical, surgical, family and social histories (including risk factors) reviewed, and no changes noted (except as noted below).  Past Medical History: Hypertension g2p0 hx of abnormal pap 10years ago but normal colopscopy.  hx of seasonal depression   prozac x 1-2 years hx migraines and gets lov one eye but no common  rx in past with? epidrine? MIDRIN   only 2 episodes.    hx of episode of cns change  without medical evaluation that was short lived.   (didnt have insurance)wonders if was a TIA  butno numbness weakness or dysarthria  Allergic rhinitis  Remote hx of  allergies in [place].  PFTS Chest x ray normal 2011  Past Surgical History: Reviewed history from 11/16/2007 and no changes required. Cholecystectomy 1998 Tonsillectomy 1974  Past History:  Care Management:  Counseling-no longer seeing Dermatology: [facility]   Family History: Reviewed history from 05/26/2009 and no changes required. ADOPTED  not available  not available.   Social History: Reviewed history from 10/01/2009 and no changes required. 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   husband smokes but not around her lives in [place]    Review of Systems  The patient denies anorexia,  fever, weight loss, syncope, prolonged cough, melena, hematochezia, severe indigestion/heartburn, abnormal bleeding, enlarged lymph nodes, and angioedema.         snores  no ob sleep apnea   Physical Exam  General:  alert, well-developed, and well-nourished.   Head:  normocephalic and atraumatic.   Eyes:  vision grossly intact and pupils equal.   Neck:  No deformities, masses, or tenderness noted. Lungs:  Normal respiratory effort, chest expands symmetrically. Lungs are clear to auscultation, no crackles or wheezes.no dullness.   Heart:  Normal rate and regular rhythm. S1 and S2 normal without gallop, murmur, click, rub or other extra sounds.no lifts.   Abdomen:  Bowel sounds positive,abdomen soft and non-tender without masses, organomegaly or  s noted. no flank pain Pulses:  pulses intact without delay   Neurologic:  non focal grossly  Skin:  no ecchymoses and no petechiae.   Cervical Nodes:  No lymphadenopathy noted Psych:  Oriented X3, good eye contact, not anxious appearing, and not depressed appearing.     Impression & Recommendations:  Problem # 1:  DYSPNEA ON EXERTION (ICD-786.09) poss combo of causes  stil could have some asthma  but nl pfts     Her updated medication list for this problem includes:    Atenolol 25 Mg Tabs (Atenolol) .Marland Kitchen... 1 by mouth once daily    Ventolin Hfa 108 (90 Base) Mcg/act Aers (Albuterol sulfate)  Orders: Cardiology Referral (Cardiology)  Problem # 2:  CHEST PAIN, ATYPICAL (ICD-786.59) needs risk assessment    ? 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   counseled  gaining weight currently   Ht: 64 (05/26/2009)   Wt: 267 (12/01/2009)   BMI: 45.31 (05/26/2009)  Problem # 7:  HYPERLIPIDEMIA (ICD-272.4) elevated tg .    needs lifestyle intervention  for risk   Problem # 8:  HYPERTENSION (ICD-401.9) stable    low dose   Her updated medication list for this problem includes:    Atenolol 25 Mg Tabs (Atenolol) .Marland Kitchen... 1 by mouth once daily  BP today: 110/80 Prior BP: 100/70 (10/01/2009)  10 Yr Risk Heart Disease: 3 %  Labs Reviewed: K+: 4.9 (11/10/2009) Creat: : 0.7 (11/10/2009)   Chol: 217 (  11/10/2009)   HDL: 37.40 (11/10/2009)   LDL: 140 (11/16/2007)   TG: 253.0 (11/10/2009)  Complete Medication List: 1)  Atenolol 25 Mg Tabs (Atenolol) .Marland Kitchen.. 1 by mouth once daily 2)  Alprazolam 0.25 Mg Tabs (Alprazolam) .... 1/2  to 1 by mouth two times a day as needed for anxiety 3)  Naftin 1 % Crea (Naftifine hcl) .... Use as directed 4)  Ventolin Hfa 108 (90 Base) Mcg/act Aers (Albuterol sulfate)  Hypertension Assessment/Plan:      The patient's hypertensive risk group is category B: At least one risk factor (excluding diabetes) with no target organ damage.  Her calculated 10 year risk of coronary heart disease is 3 %.  Today's blood pressure is 110/80.  Her blood pressure goal is < 140/90.  Patient Instructions: 1)  will do cardiology referral about  your chest pain dyspnea 2)  decrease caffiene and  carbonation  this week and  if not better then   consider urology referral.    3)  ok to continue anxiety med for now .  4)  ROV in 3 months or as needed.  Prescriptions: NAFTIN 1 % CREA (NAFTIFINE HCL) use as directed  #30 x 2   Entered by:   Romualdo Bolk, CMA (AAMA)   Authorized by:   Madelin Headings MD   Signed by:   Romualdo Bolk, CMA (AAMA) on 12/01/2009   Method used:   Print then Give to Patient   RxID:    [id number]   Laboratory Results   Urine Tests  Date/Time Received: December 01, 2009 9:14 AM   Routine Urinalysis   Color: yellow Appearance: Clear Glucose: negative   (Normal Range: Negative) Bilirubin: negative   (Normal Range: Negative) Ketone: negative   (Normal Range: Negative) Spec. Gravity: 1.020   (Normal Range: 1.003-1.035) Blood: negative   (Normal Range: Negative) pH: 6.0   (Normal Range: 5.0-8.0) Protein: negative   (Normal Range: Negative) Urobilinogen: 0.2   (Normal Range: 0-1) Nitrite: negative   (Normal Range: Negative) Leukocyte Esterace: negative   (Normal Range: Negative)    Comments: Romualdo Bolk, CMA (AAMA)  December 01, 2009 9:14 AM      Preventive Care Screening  Prior Values:    Pap Smear:  NEGATIVE FOR INTRAEPITHELIAL LESIONS OR MALIGNANCY. (05/26/2009)    Mammogram:  ASSESSMENT: Negative - BI-RADS 1^MM DIGITAL SCREENING (11/18/2009)    Last Tetanus Booster:  Td (05/11/2005)

## 2010-07-06 NOTE — Letter (Signed)
Summary: New Patient letter  [facility]  [address] [place], [place] [address]   Phone: [phone]  Fax: [phone]       12/03/2009 MRN: [id number]  Elizabeth Fleming [address] [place], [place]  [address]  Dear Ms. Vlachos,  Welcome to the Gastroenterology Division at [facility].    You are scheduled to see Dr.  Juanda Chance on 02-22-10 at 9:15a.m. on the 3rd floor at [facility], [address]  We ask that you try to arrive at our office 15 minutes prior to your appointment time to allow for check-in.  We would like you to complete the enclosed self-administered evaluation form prior to your visit and bring it with you on the day of your appointment.  We will review it with you.  Also, please bring a complete list of all your medications or, if you prefer, bring the medication bottles and we will list them.  Please bring your insurance card so that we may make a copy of it.  If your insurance requires a referral to see a specialist, please bring your referral form from your primary care physician.  Co-payments are due at the time of your visit and may be paid by cash, check or credit card.     Your office visit will consist of a consult with your physician (includes a physical exam), any laboratory testing he/she may order, scheduling of any necessary diagnostic testing (e.g. x-ray, ultrasound, CT-scan), and scheduling of a procedure (e.g. Endoscopy, Colonoscopy) if required.  Please allow enough time on your schedule to allow for any/all of these possibilities.    If you cannot keep your appointment, please call [phone] to cancel or reschedule prior to your appointment date.  This allows [place] the opportunity to schedule an appointment for another patient in need of care.  If you do not cancel or reschedule by 5 p.m. the business day prior to your appointment date, you will be charged a $50.00 late cancellation/no-show fee.    Thank you for choosing [facility] for your medical needs.  We appreciate the opportunity to care for you.  Please visit [place] at our website  to learn more about our practice.                     Sincerely,                                                             The Gastroenterology Division

## 2010-07-06 NOTE — Miscellaneous (Signed)
Summary: Orders Update pft charges  Clinical Lists Changes  Orders: Added new Service order of Carbon Monoxide diffusing w/capacity (94720) - Signed Added new Service order of Lung Volumes (94240) - Signed Added new Service order of Spirometry (Pre & Post) (94060) - Signed 

## 2010-07-06 NOTE — Assessment & Plan Note (Signed)
Summary: fup uc//ccm   Vital Signs:  Patient profile:   45 year old female Menstrual status:  regular LMP:     09/13/2009 Weight:      267 pounds O2 Sat:      98 % on Room air Temp:     98.5 degrees F oral Pulse rate:   78 / minute BP sitting:   100 / 70  (left arm) Cuff size:   large  Vitals Entered By: Romualdo Bolk, CMA (AAMA) (October 01, 2009 9:22 AM)  O2 Flow:  Room air CC: Follow-up visit from visit to urgent care. Pt is still having coughing attacks at night.-Pt states that she wants to increase the xanax. LMP (date): 09/13/2009 LMP - Character: normal Menarche (age onset years): 14   Menses interval (days): 25 Menstrual flow (days): 3 Enter LMP: 09/13/2009 Last PAP Result NEGATIVE FOR INTRAEPITHELIAL LESIONS OR MALIGNANCY.   History of Present Illness: Elizabeth Fleming comesin with     follow up from urgent care visit when she had atypical chest pain and breathing problems?   since that time she has developed   nocturnal cough  deep  and has some drainage   taking generic zyrtec  not enough  Helps some  Some   tighness when walking up stairs.   Sharp pain  Thinks some of the problem is anxiety  and two times a day xanax some better .  Records from urgent care reviewed .   Ventolin     helps for about an hour or so.   no progression but coughs currently at night   Preventive Screening-Counseling & Management  Alcohol-Tobacco     Alcohol drinks/day: 0     Smoking Status: quit     Year Quit: 1 yr 4 months  Caffeine-Diet-Exercise     Caffeine use/day: 4     Does Patient Exercise: no  Current Medications (verified): 1)  Atenolol 25 Mg  Tabs (Atenolol) .Marland Kitchen.. 1 By Mouth Once Daily 2)  Alprazolam 0.25 Mg  Tabs (Alprazolam) .... 1/2 By Mouth Two Times A Day 3)  Naftin 1 % Crea (Naftifine Hcl) .... Use As Directed 4)  Ventolin Hfa 108 (90 Base) Mcg/act Aers (Albuterol Sulfate)  Allergies (verified): 1)  ! Iodine  Past History:  Past Medical  History: Hypertension g2p0 hx of abnormal pap 10years ago but normal colopscopy.  hx of seasonal depression   prozac x 1-2 years hx migraines and gets lov one eye but no common  rx in past with? epidrine? MIDRIN   only 2 episodes.    hx of episode of cns change  without medical evaluation that was short lived.   (didnt have insurance)wonders if was a TIA butno numbness weakness or dysarthria  Allergic rhinitis  Remote hx of  allergies in [place].   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   husband smokes but not around her  Review of Systems  The patient denies anorexia, fever, weight loss, vision loss, decreased hearing, syncope, peripheral edema, headaches, melena, hematochezia, severe indigestion/heartburn, transient blindness, difficulty walking, abnormal bleeding, enlarged lymph nodes, and angioedema.    Physical Exam  General:  Well-developed,well-nourished,in no acute distress; alert,appropriate and cooperative throughout examination Head:  normocephalic and atraumatic.   Eyes:  vision grossly  intact and pupils equal.   Ears:  R ear normal and L ear normal.   Nose:  no external deformity and no external erythema.  clear congestion Mouth:  lateral  drainage tracts  no pnd Neck:  No deformities, masses, or tenderness noted. Lungs:  Normal respiratory effort, chest expands symmetrically. Lungs are clear to auscultation, no crackles or wheezes.no dullness.   Heart:  Normal rate and regular rhythm. S1 and S2 normal without gallop, murmur, click, rub or other extra sounds.no lifts.   Abdomen:  Bowel sounds positive,abdomen soft and non-tender without masses, organomegaly or  s noted. Pulses:  pulses intact without delay   Extremities:  no clubbing cyanosis or edema   Neurologic:  alert & oriented X3 and gait normal.   Skin:  turgor normal, color normal, no ecchymoses, and no petechiae.   Cervical Nodes:  No lymphadenopathy noted Psych:  Oriented X3, good eye contact, and not anxious appearing.     Impression & Recommendations:  Problem # 1:  CHEST PAIN, ATYPICAL (ICD-786.59) sounds  like poss asthma  ekg  at urgent care normal and pain atypical  Orders: T-D-Dimer Fibrin Derivatives Quantitive (16109-60454) TLB-BMP (Basic Metabolic Panel-BMET) (80048-METABOL) T-2 View CXR (71020TC) Pulmonary Referral (Pulmonary)  Problem # 2:  DYSPNEA ON EXERTION (ICD-786.09) with some  cough at present and poss asthma    ( obesity contributing) Her updated medication list for this problem includes:    Atenolol 25 Mg Tabs (Atenolol) .Marland Kitchen... 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 1 by mouth once daily 2)  Alprazolam 0.25 Mg Tabs (Alprazolam) .... 1/2  to 1 by mouth two times a day as needed for anxiety 3)  Naftin 1 % Crea (Naftifine hcl) .... Use as directed 4)  Ventolin Hfa 108 (90 Base) Mcg/act Aers (Albuterol sulfate)  Patient Instructions: 1)  Get a chest x ray.  2)  will ocntact you about results  3)  if ok I will consider a course of prednisone  and plan follow up  for evaluation for possible asthma  4)  In the mean time    can use the xanax  two times  a day if needed . Prescriptions: ALPRAZOLAM 0.25 MG  TABS (ALPRAZOLAM) 1/2  to 1 by mouth two times a day as needed for anxiety  #60 x 0   Entered and Authorized by:   Madelin Headings MD   Signed by:   Madelin Headings MD on 10/01/2009   Method used:   Print then Give to Patient   RxID:   [id number]

## 2010-07-06 NOTE — Progress Notes (Signed)
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, [employer].    Initial call taken by: Debbra Riding,  November 25, 2009 12:26 PM  Follow-up for Phone Call        ok x 1  Follow-up by: Madelin Headings MD,  November 25, 2009 5:37 PM  Additional Follow-up for Phone Call Additional follow up Details #1::        Rx faxed to pharmacy. Additional Follow-up by: Romualdo Bolk, CMA (AAMA),  November 26, 2009 9:20 AM    Prescriptions: ALPRAZOLAM 0.25 MG  TABS (ALPRAZOLAM) 1/2  to 1 by mouth two times a day as needed for anxiety  #60 x 0   Entered by:   Romualdo Bolk, CMA (AAMA)   Authorized by:   Madelin Headings MD   Signed by:   Romualdo Bolk, CMA (AAMA) on 11/26/2009   Method used:   Handwritten   RxID:   [id number]

## 2010-07-06 NOTE — Consult Note (Signed)
Summary: [facility]  [facility]   Imported By: Maryln Gottron 03/03/2010 11:30:04  _____________________________________________________________________  External Attachment:    Type:   Image     Comment:   External Document

## 2010-07-06 NOTE — Progress Notes (Signed)
Summary: ov sda thurs or next week LMTCB & make appt 4-19  Phone Note Call from Patient   Caller: Pt walks in Call For: Madelin Headings MD Summary of Call: Pt comes in today to keep an appt. that was made yesterday.  When she called yesterday, she was given a time to come today by the schedulers, but was then sent to Triage for an opinion of her chest pain.  I spoke to pt and as noted in previous phone note, she complained of SOB with chest pain upon any movement.  Advised pt, with those symptoms, she should go to the ER ASAP for evaluation, and she agreed.  The appt for today was cancelled at that point.  Dr. Fabian Sharp asked that we find out where she went to get her records and let her review them.  Pt is extremely upset with me for cancelling her appt, and will not speak to me without a third party.  Spoke to Dr. Lovell Sheehan and instructed to have Worthy Rancher RN go in to speak to the pt with me. Pt states she did not tell me she had chest pain and SOB with movement, and I should not have cancelled her appt.  Asked her permission to get her records, and sent her to Medical records with her information.  After obtaining the phone number, I called personally, and had records faxed to Dr. Fabian Sharp.  In the meantime, the patient left the office per front office staff. Will discuss with Dr. Fabian Sharp as soon as she is  finished with patients. Initial call taken by: Lynann Beaver CMA,  September 22, 2009 10:53 AM  Follow-up for Phone Call        tell patient  I reviewed the Urgent care visit   and   agree  would do follow up .   We can work her in this thursday( can use sda then) or rec OV  next week.  sometime  Follow-up by: Madelin Headings MD,  September 22, 2009 1:24 PM  Additional Follow-up for Phone Call Additional follow up Details #1::        Left message of above at home number & to call back to make appt Thurs or next week.  Also Left message work number to check message home number.   Additional Follow-up by: Rudy Jew, RN,  September 22, 2009 1:32 PM

## 2010-08-19 ENCOUNTER — Encounter: Payer: Self-pay | Admitting: Family Medicine

## 2010-08-20 ENCOUNTER — Ambulatory Visit: Payer: Self-pay | Admitting: Family Medicine

## 2010-08-27 ENCOUNTER — Ambulatory Visit (INDEPENDENT_AMBULATORY_CARE_PROVIDER_SITE_OTHER): Payer: 59 | Admitting: Family Medicine

## 2010-08-27 ENCOUNTER — Encounter: Payer: Self-pay | Admitting: Family Medicine

## 2010-08-27 VITALS — BP 124/82 | HR 72 | Temp 98.7°F | Ht 65.0 in | Wt 249.1 lb

## 2010-08-27 DIAGNOSIS — R102 Pelvic and perineal pain unspecified side: Secondary | ICD-10-CM

## 2010-08-27 DIAGNOSIS — E669 Obesity, unspecified: Secondary | ICD-10-CM

## 2010-08-27 DIAGNOSIS — R198 Other specified symptoms and signs involving the digestive system and abdomen: Secondary | ICD-10-CM

## 2010-08-27 DIAGNOSIS — N949 Unspecified condition associated with female genital organs and menstrual cycle: Secondary | ICD-10-CM

## 2010-08-27 NOTE — Assessment & Plan Note (Signed)
Improved! Congratulated her on success and encouraged her to get more exercise.

## 2010-08-27 NOTE — Assessment & Plan Note (Signed)
May be related to perimenopause and has certainly been referred to mainly specialists to rule out red flag issues. I do question if endometriosis is a possibility although not a classic presentation. Will order complete pelvic U/S.  Pt in agreement with plan.

## 2010-08-27 NOTE — Progress Notes (Signed)
Subjective:    Patient ID: Elizabeth Fleming, female    DOB: [birth year], 45 y.o.   MRN: [id number]  HPI 45 yo here to establish care.  Pt of Dr. Berniece Andreas, UTD on prevention.  CPX on 05/04/2010. Notes and prevention reviewed.  Pelvic pain- started in June.  Pt notices that 2 weeks out of each month, usually around her menstrual period, she is significant pain- pelvic and lower back.   Also associated with constipation and urinary frequency.  Was referred to sports med, Dr. Margaretha Sheffield who ordered an MRI significant for small buldge at L5-S1, no sig cord compression.  Referred to Dr. Juanda Chance, colonoscopy unremarkable except for small polyp (see health maintenance).  Sent to urology, Dr. Brunilda Payor, placed on OAB medication which has not helped although she does not have any urgency when the other symptoms resolve and she is not premenstrual or menstruating.  Then referred to Dr. Chevis Pretty, OBGYN, per pt was told that these symptoms are consistent with perimenopause and advised to loose weight. Pt has already lost 20 pounds since December with [employer].  Periods are not very heavy.  Does not know if she has a family h/o uterine, ovarian or breast CA because she is adopted.  Seasonal allergies- have been well controlled with daily Zyrtec, 10 mg.     Review of Systems General: Denies fever, chills, sweats.  Eyes: Denies blurring,significant itching Cardiovascular: Denies chest pains, palpitations, dyspnea on exertion,  Respiratory: Denies cough, dyspnea at rest,wheeezing Breast: no concerns about lumps GI: Denies nausea, vomiting, diarrhea GU: Denies dysuria, hematuria, urinary hesitancy, nocturia, denies STD risk, no concerns about discharge Musculoskeletal: Denies back pain, joint pain Derm: Denies rash, itching Neuro: Denies  paresthesias, frequent falls, frequent headaches Psych: Denies depression, anxiety Endocrine: Denies cold intolerance, heat intolerance, polydipsia Heme: Denies  enlarged lymph nodes Allergy: No hayfever  BP 124/82  Pulse 72  Temp(Src) 98.7 F (37.1 C) (Oral)  Ht 5\' 5"  (1.651 m)  Wt 249 lb 1.9 oz (113 kg)  BMI 41.46 kg/m2  Past Medical History  Diagnosis Date  . Hypertension   . Depression   . Migraines   . Allergy   . Colon polyps 2012  . Genital warts     Past Surgical History  Procedure Date  . Cholecystectomy   . Tonsillectomy 1974    History   Social History  . Marital Status: Married    Spouse Name: Vida Roller    Number of Children: 2  . Years of Education: 14   Occupational History  . Health Advisor     [employer]   Social History Main Topics  . Smoking status: Former Smoker    Quit date: 08/27/2006  . Smokeless tobacco: Not on file  . Alcohol Use: No  . Drug Use: No  . Sexually Active: Yes   Other Topics Concern  . Not on file   Social History Narrative  . No narrative on file    Family History  Problem Relation Age of Onset  . Adopted: Yes    Allergies  Allergen Reactions  . Iodine     Current Outpatient Prescriptions on File Prior to Visit  Medication Sig Dispense Refill  . ALPRAZolam (XANAX) 0.25 MG tablet Take 1/2 to 1 tablet by mouth two times a day as needed for anxiety       . atenolol (TENORMIN) 25 MG tablet Take 25 mg by mouth daily.        . Calcium Carbonate  Antacid (MAALOX) 600 MG chewable tablet Chew 600 mg by mouth daily.        . cetirizine (ZYRTEC) 10 MG tablet Take 10 mg by mouth daily.        Marland Kitchen dicyclomine (BENTYL) 20 MG tablet Take 20 mg by mouth 2 (two) times daily.                Objective:   Physical Exam General:  alert, well-developed, and well-nourished.   Head:  normocephalic and atraumatic.   Eyes:  vision grossly intact and pupils equal.   Neck:  No deformities, masses, or tenderness noted. Lungs:  Normal respiratory effort, chest expands symmetrically. Lungs are clear to auscultation, no crackles or wheezes.no dullness.   Heart:  Normal rate  and regular rhythm. S1 and S2 normal without gallop, murmur, click, rub or other extra sounds.no lifts.   Abdomen:  Bowel sounds positive,abdomen soft and non-tender without masses, organomegaly or  s noted. no flank pain Pulses:  pulses intact without delay   Neurologic:  non focal grossly  Skin:  no ecchymoses and no petechiae.   Cervical Nodes:  No lymphadenopathy noted Psych:  Oriented X3, good eye contact, not anxious appearing, and not depressed appearing.          Assessment & Plan:

## 2010-08-27 NOTE — Patient Instructions (Signed)
Great to meet you. Please say hello to your husband for me. Stop by to see Shirlee Limerick on your way out.

## 2010-08-27 NOTE — Assessment & Plan Note (Signed)
Unchanged.  I question if this is related to her pelvic pain, ?endometriosis.  Will await Ultrasound results.

## 2010-09-03 ENCOUNTER — Ambulatory Visit: Payer: Self-pay | Admitting: Family Medicine

## 2010-09-06 ENCOUNTER — Telehealth: Payer: Self-pay | Admitting: *Deleted

## 2010-09-06 NOTE — Telephone Encounter (Signed)
Left message on machine at home for patient to return call. 

## 2010-09-06 NOTE — Telephone Encounter (Signed)
We can refer her to OBGYN.

## 2010-09-07 ENCOUNTER — Other Ambulatory Visit: Payer: Self-pay | Admitting: Family Medicine

## 2010-09-07 DIAGNOSIS — R102 Pelvic and perineal pain: Secondary | ICD-10-CM

## 2010-09-07 NOTE — Telephone Encounter (Signed)
Referral placed.

## 2010-09-07 NOTE — Telephone Encounter (Signed)
Patient advised as instructed via telephone.  She agrees to have referral to obgyn.

## 2010-09-09 ENCOUNTER — Encounter: Payer: Self-pay | Admitting: Family Medicine

## 2010-09-16 ENCOUNTER — Telehealth: Payer: Self-pay | Admitting: Family Medicine

## 2010-09-16 NOTE — Telephone Encounter (Signed)
Called the pt to set up GYN appt. Patient now says she doesn't want the referral right now.

## 2010-09-17 ENCOUNTER — Other Ambulatory Visit: Payer: Self-pay | Admitting: *Deleted

## 2010-09-17 MED ORDER — ALPRAZOLAM 0.25 MG PO TABS: 0.2500 mg | ORAL_TABLET | Freq: Three times a day (TID) | ORAL | Status: DC | PRN

## 2010-09-17 NOTE — Telephone Encounter (Signed)
Rx called to [facility]

## 2010-09-23 NOTE — Telephone Encounter (Signed)
Noted! Thank you

## 2010-10-18 ENCOUNTER — Ambulatory Visit (INDEPENDENT_AMBULATORY_CARE_PROVIDER_SITE_OTHER): Payer: 59 | Admitting: Family Medicine

## 2010-10-18 VITALS — BP 102/70 | HR 72 | Temp 98.5°F | Ht 65.0 in | Wt 249.1 lb

## 2010-10-18 DIAGNOSIS — J029 Acute pharyngitis, unspecified: Secondary | ICD-10-CM

## 2010-10-18 DIAGNOSIS — J069 Acute upper respiratory infection, unspecified: Secondary | ICD-10-CM

## 2010-10-18 LAB — POCT RAPID STREP A (OFFICE): Rapid Strep A Screen: NEGATIVE

## 2010-10-18 MED ORDER — FLUTICASONE PROPIONATE 50 MCG/ACT NA SUSP: 2.0000 | Freq: Every day | NASAL | Status: DC

## 2010-10-18 MED ORDER — AMOXICILLIN 500 MG PO CAPS: 500.0000 mg | ORAL_CAPSULE | Freq: Two times a day (BID) | ORAL | Status: AC

## 2010-10-18 NOTE — Progress Notes (Signed)
Subjective:     Elizabeth Fleming is a 45 y.o. female who presents for evaluation of sore throat. Associated symptoms include headache, myalgias, nasal blockage, post nasal drip, sinus and nasal congestion and sore throat. Onset of symptoms was 2 days ago, and have been gradually worsening since that time. She is drinking plenty of fluids. She has not had a recent close exposure to someone with proven streptococcal pharyngitis.  The PMH, PSH, Social History, Family History, Medications, and allergies have been reviewed in [facility], and have been updated if relevant.   Review of Systems Pertinent items are noted in HPI.    Objective:    BP 102/70  Pulse 72  Temp(Src) 98.5 F (36.9 C) (Oral)  Ht 5\' 5"  (1.651 m)  Wt 249 lb 1.9 oz (113 kg)  BMI 41.46 kg/m2  General Appearance:    Alert, cooperative, no distress, appears stated age  Head:    Normocephalic, without obvious abnormality, atraumatic  Eyes:    PERRL, conjunctiva/corneas clear, EOM's intact, fundi    benign, both eyes  Ears:    Right TM, thick fluid, canal erythematous, Left TM retracted  Nose:   Positive erythema and edema bilaterally, Right>left. No obstructions, right frontal sinus TTP  Throat:   Lips, mucosa, and tongue normal; mild erythema of throat, no exudate  Neck:   Supple, symmetrical, trachea midline, no adenopathy;    thyroid:  no enlargement/tenderness/nodules; no carotid   bruit or JVD  Back:     Symmetric, no curvature, ROM normal, no CVA tenderness  Lungs:     Clear to auscultation bilaterally, respirations unlabored  Chest Wall:    No tenderness or deformity   Heart:    Regular rate and rhythm, S1 and S2 normal, no murmur, rub   or gallop  Skin:   Skin color, texture, turgor normal, no rashes or lesions  Lymph nodes:   Cervical, supraclavicular, and axillary nodes normal  Neurologic:   CNII-XII intact, normal strength, sensation and reflexes    throughout    Laboratory Strep test done. Results:negative.      Assessment:    Acute pharyngitis, likely  Sinusitis with post nasal drip.  Right otitis media   Plan:    Patient placed on antibiotics. Use of OTC analgesics recommended as well as salt water gargles. Use of decongestant recommended.   Flonase rx sent in as well.

## 2010-10-20 ENCOUNTER — Telehealth: Payer: Self-pay | Admitting: *Deleted

## 2010-10-20 MED ORDER — AZITHROMYCIN 250 MG PO TABS: ORAL_TABLET | ORAL | Status: AC

## 2010-10-20 NOTE — Telephone Encounter (Signed)
Pt has been taking amox since Monday afternoon, taking it for a sinus infection.  She started having fairly severe abd pain yesterday.  She thinks the amox is causing this and she doesn't want to take any more.  She has had 4 doses all together.  Uses [facility] [address].  Please leave detailed message on voice mail.

## 2010-10-20 NOTE — Telephone Encounter (Signed)
Patient advised via message left on cell phone voicemail.  Advised her to call us within a few days to update [place] on her symptoms and if they changed or got worse to let us know.

## 2010-10-20 NOTE — Telephone Encounter (Signed)
Ok please ask her to stop taking it. Please call in rx for Zpack as written below. Have her call us with an update in the next day or two. Please make sure she is not having any other symptoms, such as rashes, wheezing, trouble breathing, etc.

## 2011-01-10 ENCOUNTER — Other Ambulatory Visit: Payer: Self-pay | Admitting: *Deleted

## 2011-01-10 MED ORDER — ALPRAZOLAM 0.25 MG PO TABS: 0.2500 mg | ORAL_TABLET | Freq: Three times a day (TID) | ORAL | Status: DC | PRN

## 2011-01-10 NOTE — Telephone Encounter (Signed)
Rx called to [facility].

## 2011-03-23 ENCOUNTER — Other Ambulatory Visit: Payer: Self-pay | Admitting: Family Medicine

## 2011-03-23 DIAGNOSIS — I10 Essential (primary) hypertension: Secondary | ICD-10-CM

## 2011-03-23 DIAGNOSIS — E785 Hyperlipidemia, unspecified: Secondary | ICD-10-CM

## 2011-03-28 ENCOUNTER — Other Ambulatory Visit (INDEPENDENT_AMBULATORY_CARE_PROVIDER_SITE_OTHER): Payer: 59

## 2011-03-28 DIAGNOSIS — E785 Hyperlipidemia, unspecified: Secondary | ICD-10-CM

## 2011-03-28 DIAGNOSIS — I10 Essential (primary) hypertension: Secondary | ICD-10-CM

## 2011-03-28 LAB — BASIC METABOLIC PANEL
Calcium: 8.7 mg/dL (ref 8.4–10.5)
GFR: 75.29 mL/min (ref >60.00)
Glucose, Bld: 112 mg/dL — ABNORMAL HIGH (ref 70–99)
Sodium: 138 mEq/L (ref 135–145)

## 2011-03-28 LAB — HEPATIC FUNCTION PANEL
ALT: 15 U/L (ref 0–35)
AST: 22 U/L (ref 0–37)
Alkaline Phosphatase: 73 U/L (ref 39–117)
Total Bilirubin: 0.6 mg/dL (ref 0.3–1.2)

## 2011-03-28 LAB — LIPID PANEL
HDL: 37.3 mg/dL — ABNORMAL LOW (ref >39.00)
Triglycerides: 111 mg/dL (ref 0.0–149.0)

## 2011-04-05 ENCOUNTER — Other Ambulatory Visit (HOSPITAL_COMMUNITY)
Admission: RE | Admit: 2011-04-05 | Discharge: 2011-04-05 | Disposition: A | Discharge status: Home / Self Care | Payer: 59 | Source: Ambulatory Visit | Attending: Family Medicine | Admitting: Family Medicine

## 2011-04-05 ENCOUNTER — Ambulatory Visit (INDEPENDENT_AMBULATORY_CARE_PROVIDER_SITE_OTHER): Payer: 59 | Admitting: Family Medicine

## 2011-04-05 ENCOUNTER — Encounter: Payer: Self-pay | Admitting: Family Medicine

## 2011-04-05 VITALS — BP 110/70 | HR 54 | Temp 98.9°F | Ht 65.0 in | Wt 227.5 lb

## 2011-04-05 DIAGNOSIS — R7309 Other abnormal glucose: Secondary | ICD-10-CM

## 2011-04-05 DIAGNOSIS — Z01419 Encounter for gynecological examination (general) (routine) without abnormal findings: Secondary | ICD-10-CM | POA: Insufficient documentation

## 2011-04-05 DIAGNOSIS — E785 Hyperlipidemia, unspecified: Secondary | ICD-10-CM

## 2011-04-05 DIAGNOSIS — F4322 Adjustment disorder with anxiety: Secondary | ICD-10-CM

## 2011-04-05 DIAGNOSIS — Z0000 Encounter for general adult medical examination without abnormal findings: Secondary | ICD-10-CM | POA: Insufficient documentation

## 2011-04-05 DIAGNOSIS — I10 Essential (primary) hypertension: Secondary | ICD-10-CM

## 2011-04-05 DIAGNOSIS — R102 Pelvic and perineal pain: Secondary | ICD-10-CM

## 2011-04-05 DIAGNOSIS — N949 Unspecified condition associated with female genital organs and menstrual cycle: Secondary | ICD-10-CM

## 2011-04-05 DIAGNOSIS — Z1159 Encounter for screening for other viral diseases: Secondary | ICD-10-CM | POA: Insufficient documentation

## 2011-04-05 MED ORDER — ATENOLOL 25 MG PO TABS: 25.0000 mg | ORAL_TABLET | Freq: Every day | ORAL | Status: DC

## 2011-04-05 MED ORDER — ALBUTEROL SULFATE HFA 108 (90 BASE) MCG/ACT IN AERS: 2.0000 | INHALATION_SPRAY | Freq: Four times a day (QID) | RESPIRATORY_TRACT | Status: DC | PRN

## 2011-04-05 MED ORDER — METFORMIN HCL 500 MG PO TABS: 500.0000 mg | ORAL_TABLET | Freq: Two times a day (BID) | ORAL | Status: DC

## 2011-04-05 MED ORDER — ALPRAZOLAM 0.25 MG PO TABS: 0.2500 mg | ORAL_TABLET | Freq: Three times a day (TID) | ORAL | Status: DC | PRN

## 2011-04-05 NOTE — Progress Notes (Signed)
Subjective:    Patient ID: Elizabeth Fleming, female    DOB: Dec 24, 1965, 45 y.o.   MRN: [id number]  HPI 45 yo here for CPX.   Pelvic pain- improved.  Has lost significant amount of weight this year, which has helped.  Does not know if she has a family h/o uterine, ovarian or breast CA because she is adopted.  Seasonal allergies- have been well controlled with daily Zyrtec, 10 mg.  Hyperglyemcia- fasting CBG remains elevated at 112 but TG and LDL markedly improved. a1c 5.6 last year.    Lab Results  Component Value Date   CHOL 175 03/28/2011   CHOL 215* 04/28/2010   CHOL 217* 11/10/2009   Lab Results  Component Value Date   HDL 37.30* 03/28/2011   HDL 37.00* 04/28/2010   HDL 37.40* 11/10/2009   Lab Results  Component Value Date   LDLCALC 116* 03/28/2011   LDLCALC 140* 11/16/2007   Lab Results  Component Value Date   TRIG 111.0 03/28/2011   TRIG 200.0* 04/28/2010   TRIG 253.0* 11/10/2009    Lab Results  Component Value Date   ALT 15 03/28/2011   AST 22 03/28/2011   ALKPHOS 73 03/28/2011   BILITOT 0.6 03/28/2011     Review of Systems Patient reports no  vision/ hearing changes,anorexia, weight change, fever ,adenopathy, persistant / recurrent hoarseness, swallowing issues, chest pain, edema,persistant / recurrent cough, hemoptysis, dyspnea(rest, exertional, paroxysmal nocturnal), gastrointestinal  bleeding (melena, rectal bleeding), abdominal pain, excessive heart burn, GU symptoms(dysuria, hematuria, pyuria, voiding/incontinence  Issues) syncope, focal weakness, severe memory loss, concerning skin lesions, depression, anxiety, abnormal bruising/bleeding, major joint swelling, breast masses or abnormal vaginal bleeding.      Past Medical History  Diagnosis Date  . Hypertension   . Depression   . Migraines   . Allergy   . Colon polyps 2012  . Genital warts     Past Surgical History  Procedure Date  . Cholecystectomy   . Tonsillectomy 1974    History   Social  History  . Marital Status: Married    Spouse Name: Vida Roller    Number of Children: 2  . Years of Education: 14   Occupational History  . Health Advisor     [employer]   Social History Main Topics  . Smoking status: Former Smoker    Quit date: 08/27/2006  . Smokeless tobacco: Not on file  . Alcohol Use: No  . Drug Use: No  . Sexually Active: Yes   Other Topics Concern  . Not on file   Social History Narrative  . No narrative on file    Family History  Problem Relation Age of Onset  . Adopted: Yes    Allergies  Allergen Reactions  . Iodine     Current Outpatient Prescriptions on File Prior to Visit  Medication Sig Dispense Refill  . ALPRAZolam (XANAX) 0.25 MG tablet Take 1 tablet (0.25 mg total) by mouth 3 (three) times daily as needed for sleep. Take 1/2 to 1 tablet by mouth two times a day as needed for anxiety  60 tablet  0  . atenolol (TENORMIN) 25 MG tablet Take 25 mg by mouth daily.        Jennette Banker Sodium 30-100 MG CAPS Take 1 tablet by mouth daily.        . cetirizine (ZYRTEC) 10 MG tablet Take 10 mg by mouth daily.        . fish oil-omega-3 fatty acids 1000 MG capsule  Take 1 g by mouth daily.        . fluticasone (FLONASE) 50 MCG/ACT nasal spray 2 sprays by Nasal route daily.  16 g  12          Objective:   Physical Exam There were no vitals taken for this visit. Wt Readings from Last 3 Encounters:  04/05/11 227 lb 8 oz (103.193 kg)  10/18/10 249 lb 1.9 oz (113 kg)  08/27/10 249 lb 1.9 oz (113 kg)    BP 110/70  Pulse 54  Temp(Src) 98.9 F (37.2 C) (Oral)  Ht 5\' 5"  (1.651 m)  Wt 227 lb 8 oz (103.193 kg)  BMI 37.86 kg/m2  LMP 04/02/2011  General:  Well-developed,well-nourished,in no acute distress; alert,appropriate and cooperative throughout examination Head:  normocephalic and atraumatic.   Eyes:  vision grossly intact, pupils equal, pupils round, and pupils reactive to light.   Ears:  R ear normal and L ear  normal.   Nose:  no external deformity.   Mouth:  good dentition.   Neck:  No deformities, masses, or tenderness noted. Breasts:  No mass, nodules, thickening, tenderness, bulging, retraction, inflamation, nipple discharge or skin changes noted.   Lungs:  Normal respiratory effort, chest expands symmetrically. Lungs are clear to auscultation, no crackles or wheezes. Heart:  Normal rate and regular rhythm. S1 and S2 normal without gallop, murmur, click, rub or other extra sounds. Abdomen:  Bowel sounds positive,abdomen soft and non-tender without masses, organomegaly or hernias noted. Rectal:  no external abnormalities.   Genitalia:  Pelvic Exam:        External: normal female genitalia without lesions or masses        Vagina: normal without lesions or masses        Cervix: normal without lesions or masses        Adnexa: normal bimanual exam without masses or fullness        Uterus: normal by palpation        Pap smear: performed Msk:  No deformity or scoliosis noted of thoracic or lumbar spine.   Extremities:  No clubbing, cyanosis, edema, or deformity noted with normal full range of motion of all joints.   Neurologic:  alert & oriented X3 and gait normal.   Skin:  Intact without suspicious lesions or rashes Cervical Nodes:  No lymphadenopathy noted Axillary Nodes:  No palpable lymphadenopathy Psych:  Cognition and judgment appear intact. Alert and cooperative with normal attention span and concentration. No apparent delusions, illusions, hallucinations   Assessment & Plan:   1. Routine general medical examination at a health care facility  Reviewed preventive care protocols, scheduled due services, and updated immunizations Discussed nutrition, exercise, diet, and healthy lifestyle.  Cytology -Pap Smear  2 HYPERLIPIDEMIA  Much improved with diet. HDL still low- Metformin will hopefully improve this.   3. HYPERGLYCEMIA, FASTING  Deteriorated.  Will start Metformin 500 mg twice  daily, continue low sugar diet. metFORMIN (GLUCOPHAGE) 500 MG tablet, Basic metabolic panel, Hemoglobin A1c

## 2011-04-05 NOTE — Patient Instructions (Signed)
Wonderful to see you. Please make a lab appointment to get your sugar rechecked in 6 months.  Metformin tablets What is this medicine? METFORMIN (met FOR min) is used to treat type 2 diabetes. It helps to control blood sugar. Treatment is combined with diet and exercise. This medicine can be used alone or with other medicines for diabetes. This medicine may be used for other purposes; ask your health care provider or pharmacist if you have questions. What should I tell my health care provider before I take this medicine? They need to know if you have any of these conditions: -anemia -frequently drink alcohol-containing beverages -become easily dehydrated -heart attack -heart failure that is treated with medications -kidney disease -liver disease -polycystic ovary syndrome -serious infection or injury -vomiting -an unusual or allergic reaction to metformin, other medicines, foods, dyes, or preservatives -pregnant or trying to get pregnant -breast-feeding How should I use this medicine? Take this medicine by mouth. Take it with meals. Swallow the tablets with a drink of water. Follow the directions on the prescription label. Take your medicine at regular intervals. Do not take your medicine more often than directed. Talk to your pediatrician regarding the use of this medicine in children. While this drug may be prescribed for children as young as 45 years of age for selected conditions, precautions do apply. Overdosage: If you think you have taken too much of this medicine contact a poison control center or emergency room at once. NOTE: This medicine is only for you. Do not share this medicine with others. What if I miss a dose? If you miss a dose, take it as soon as you can. If it is almost time for your next dose, take only that dose. Do not take double or extra doses. What may interact with this medicine? Do not take this medicine with any of the following  medications: -dofetilide -gatifloxacin -certain contrast medicines given before X-rays, CT scans, MRI, or other procedures This medicine may also interact with the following medications: -digoxin -diuretics -female hormones, like estrogens or progestins and birth control pills -isoniazid -medicines for blood pressure, heart disease, irregular heart beat -morphine -nicotinic acid -phenothiazines like chlorpromazine, mesoridazine, prochlorperazine, thioridazine -phenytoin -procainamide -quinidine -quinine -ranitidine -steroid medicines like prednisone or cortisone -stimulant medicines for attention disorders, weight loss, or to stay awake -thyroid medicines -trimethoprim -vancomycin This list may not describe all possible interactions. Give your health care provider a list of all the medicines, herbs, non-prescription drugs, or dietary supplements you use. Also tell them if you smoke, drink alcohol, or use illegal drugs. Some items may interact with your medicine. What should I watch for while using this medicine? Visit your doctor or health care professional for regular checks on your progress. Learn how to check your blood sugar. Learn the symptoms of low and high blood sugar and how to manage them. If you have low blood sugar, eat or drink something that has sugar. Make sure others know to get medical help quickly if you have serious symptoms of low blood sugar, like if you become unconscious or have a seizure. If you need surgery or if you will need a procedure with contrast drugs, tell your doctor or health care professional that you are taking this medicine. Wear a medical identification bracelet or chain to say you have diabetes, and carry a card that lists all your medications. What side effects may I notice from receiving this medicine? Side effects that you should report to your doctor or health  care professional as soon as possible: -allergic reactions like skin rash, itching  or hives, swelling of the face, lips, or tongue -breathing problems -feeling faint or lightheaded, falls -low blood sugar (ask your doctor or health care professional for a list of these symptoms) -muscle aches or pains -slow or irregular heartbeat -unusual stomach pain or discomfort -unusually tired or weak Side effects that usually do not require medical attention (report to your doctor or health care professional if they continue or are bothersome): -diarrhea -headache -heartburn -metallic taste in mouth -nausea -stomach gas, upset This list may not describe all possible side effects. Call your doctor for medical advice about side effects. You may report side effects to FDA at 1-800-FDA-1088. Where should I keep my medicine? Keep out of the reach of children. Store at room temperature between 15 and 30 degrees C (59 and 86 degrees F). Protect from moisture and light. Throw away any unused medicine after the expiration date. NOTE: This sheet is a summary. It may not cover all possible information. If you have questions about this medicine, talk to your doctor, pharmacist, or health care provider.  2012, Elsevier/Gold Standard. (12/10/2007 3:40:54 PM)

## 2011-04-05 NOTE — Progress Notes (Signed)
Rx for Alprazolam called to [facility].

## 2011-04-07 ENCOUNTER — Encounter: Payer: Self-pay | Admitting: *Deleted

## 2011-04-29 ENCOUNTER — Telehealth: Payer: Self-pay | Admitting: *Deleted

## 2011-04-29 NOTE — Telephone Encounter (Signed)
Pt has brought in a form for her insurance that needs to be completed and signed, this is on your desk.

## 2011-05-02 ENCOUNTER — Telehealth: Payer: Self-pay | Admitting: *Deleted

## 2011-05-02 NOTE — Telephone Encounter (Signed)
Completed.

## 2011-05-02 NOTE — Telephone Encounter (Signed)
Completed form ([employer] Program) faxed to [employer] at [phone].  Original form mailed to patients home address.

## 2011-05-13 ENCOUNTER — Other Ambulatory Visit: Payer: 59

## 2011-05-16 ENCOUNTER — Encounter: Payer: 59 | Admitting: Family Medicine

## 2011-05-18 ENCOUNTER — Encounter: Payer: 59 | Admitting: Family Medicine

## 2011-08-01 ENCOUNTER — Other Ambulatory Visit: Payer: Self-pay | Admitting: Family Medicine

## 2011-08-02 ENCOUNTER — Other Ambulatory Visit: Payer: Self-pay | Admitting: Family Medicine

## 2011-08-02 NOTE — Telephone Encounter (Signed)
Medicine called to pharmacy. 

## 2011-09-26 ENCOUNTER — Other Ambulatory Visit (INDEPENDENT_AMBULATORY_CARE_PROVIDER_SITE_OTHER): Payer: 59

## 2011-09-26 DIAGNOSIS — R7309 Other abnormal glucose: Secondary | ICD-10-CM

## 2011-09-26 LAB — BASIC METABOLIC PANEL
CO2: 26 mEq/L (ref 19–32)
Chloride: 103 mEq/L (ref 96–112)
Glucose, Bld: 112 mg/dL — ABNORMAL HIGH (ref 70–99)
Potassium: 4.6 mEq/L (ref 3.5–5.1)
Sodium: 138 mEq/L (ref 135–145)

## 2011-10-06 ENCOUNTER — Other Ambulatory Visit: Payer: Self-pay | Admitting: Family Medicine

## 2011-10-07 ENCOUNTER — Other Ambulatory Visit: Payer: Self-pay | Admitting: Family Medicine

## 2011-10-07 NOTE — Telephone Encounter (Signed)
Medicine called to [facility]. 

## 2011-10-10 NOTE — Telephone Encounter (Signed)
Refill already called to pharmacy.

## 2011-10-17 ENCOUNTER — Encounter: Payer: Self-pay | Admitting: Family Medicine

## 2011-10-17 ENCOUNTER — Ambulatory Visit (INDEPENDENT_AMBULATORY_CARE_PROVIDER_SITE_OTHER): Payer: 59 | Admitting: Family Medicine

## 2011-10-17 VITALS — BP 130/84 | HR 52 | Temp 98.1°F | Wt 197.0 lb

## 2011-10-17 DIAGNOSIS — T148XXA Other injury of unspecified body region, initial encounter: Secondary | ICD-10-CM

## 2011-10-17 DIAGNOSIS — T148 Other injury of unspecified body region: Secondary | ICD-10-CM

## 2011-10-17 DIAGNOSIS — W57XXXA Bitten or stung by nonvenomous insect and other nonvenomous arthropods, initial encounter: Secondary | ICD-10-CM

## 2011-10-17 MED ORDER — CYCLOBENZAPRINE HCL 5 MG PO TABS: 5.0000 mg | ORAL_TABLET | Freq: Three times a day (TID) | ORAL | Status: DC | PRN

## 2011-10-17 NOTE — Progress Notes (Signed)
Addended by: Alvina Chou on: 10/17/2011 10:15 AM   Modules accepted: Orders

## 2011-10-17 NOTE — Patient Instructions (Signed)
Good to see you. I do think this is a muscle spasm. Use heat/ice over area if helps. Tylenol/Ibuprofen as needed. Flexeril as needed at night. I will call you with your lab results.

## 2011-10-17 NOTE — Progress Notes (Signed)
Subjective:    Patient ID: Elizabeth Fleming, female    DOB: [birth year], 46 y.o.   MRN: [id number]  HPI  46 yo here for tick bite.  Bitten by a tick 2-3 weeks ago on right shoulder.  Husband removed tick.  She was feeling fine but over past few days, having right shoulder pain. No fevers or chills. No rash. No abdominal pain. No n/v/d. No sensitivity to light.  Patient Active Problem List  Diagnoses  . HYPERLIPIDEMIA  . OBESITY  . ADJUSTMENT DISORDER WITH ANXIETY  . MIGRAINE WITH AURA  . HYPERTENSION  . ALLERGIC RHINITIS  . GERD  . DYSPNEA ON EXERTION  . CHEST PAIN, ATYPICAL  . CHANGE IN BOWELS  . INCONTINENCE, URGE  . URINARY URGENCY  . HYPERGLYCEMIA, FASTING  . Pelvic pain in female  . Routine general medical examination at a health care facility  . Tick bite   Past Medical History  Diagnosis Date  . Hypertension   . Depression   . Migraines   . Allergy   . Colon polyps 2012  . Genital warts    Past Surgical History  Procedure Date  . Cholecystectomy   . Tonsillectomy 1974   History  Substance Use Topics  . Smoking status: Former Smoker    Quit date: 08/27/2006  . Smokeless tobacco: Not on file  . Alcohol Use: No   Family History  Problem Relation Age of Onset  . Adopted: Yes   Allergies  Allergen Reactions  . Iodine    Current Outpatient Prescriptions on File Prior to Visit  Medication Sig Dispense Refill  . albuterol (PROVENTIL HFA;VENTOLIN HFA) 108 (90 BASE) MCG/ACT inhaler Inhale 2 puffs into the lungs every 6 (six) hours as needed for wheezing.  1 Inhaler  1  . ALPRAZolam (XANAX) 0.25 MG tablet TAKE ONE TABLET BY MOUTH THREE TIMES DAILY AS NEEDED  90 tablet  0  . atenolol (TENORMIN) 25 MG tablet Take 1 tablet (25 mg total) by mouth daily.  90 tablet  3  . cetirizine (ZYRTEC) 10 MG tablet Take 10 mg by mouth daily.        . fish oil-omega-3 fatty acids 1000 MG capsule Take 1 g by mouth daily.        . fluticasone (FLONASE) 50 MCG/ACT nasal spray  2 sprays by Nasal route daily.  16 g  12  . metFORMIN (GLUCOPHAGE) 500 MG tablet TAKE ONE TABLET BY MOUTH TWICE DAILY WITH MEALS  60 tablet  8   The PMH, PSH, Social History, Family History, Medications, and allergies have been reviewed in [facility], and have been updated if relevant.   Review of Systems    See HPI Objective:   Physical Exam BP 130/84  Pulse 52  Temp(Src) 98.1 F (36.7 C) (Oral)  Wt 197 lb (89.359 kg)  LMP 10/13/2011  General:  Well-developed,well-nourished,in no acute distress; alert,appropriate and cooperative throughout examination Head:  normocephalic and atraumatic.   Eyes:  vision grossly intact, pupils equal, pupils round, and pupils reactive to light.   Ears:  R ear normal and L ear normal.   Nose:  no external deformity.   Mouth:  good dentition.   Neck:  No deformities, masses, or tenderness noted. Msk:  FROM of shoulders bilaterally, TTP over right trapezius Extremities:  No clubbing, cyanosis, edema, or deformity noted with normal full range of motion of all joints.   Neurologic:  alert & oriented X3 and gait normal.   Skin:   Very  small circular erythematous lesion on right posterior shoulder, no central clearing or surrounding erythema or warmth. Cervical Nodes:  No lymphadenopathy noted Psych:  Cognition and judgment appear intact. Alert and cooperative with normal attention span and concentration. No apparent delusions, illusions, hallucinations        Assessment & Plan:   1. Tick bite  Rocky mtn spotted fvr abs pnl(IgG+IgM), Lyme, Total Ab Test/Reflex  New- reassuring and unlikely tick borne illness.  Given duration of time since tick bite, we can check titers for reassurance.  Pt would like to do that.  No signs of infection at area of tick bite.  Will not start doxy as no signs of tick borne illness. Shoulder pain consistent with trapezius spasm- advised supportive care. See pt instructions for details.

## 2011-10-18 LAB — ROCKY MTN SPOTTED FVR ABS PNL(IGG+IGM)
RMSF IgG: 0.31 IV
RMSF IgM: 0.33 IV

## 2011-10-24 ENCOUNTER — Telehealth: Payer: Self-pay | Admitting: Family Medicine

## 2011-10-24 NOTE — Telephone Encounter (Signed)
Left message on voice mail advising patient. 

## 2011-10-24 NOTE — Telephone Encounter (Signed)
Patient requesting results on lyme disease test and said that she allows permission for the detailed results to be left on her voice mail if she is not available to answer the call.  Please call/leave message at [phone].

## 2011-10-24 NOTE — Telephone Encounter (Signed)
Yes, just came back.  It was negative.

## 2011-10-24 NOTE — Telephone Encounter (Signed)
Dr. Dayton Martes, do we have the final result?

## 2012-01-03 ENCOUNTER — Other Ambulatory Visit: Payer: Self-pay | Admitting: Family Medicine

## 2012-01-04 NOTE — Telephone Encounter (Signed)
Medicine called to pharmacy- [facility]

## 2012-02-29 ENCOUNTER — Other Ambulatory Visit: Payer: Self-pay | Admitting: Sports Medicine

## 2012-02-29 DIAGNOSIS — M545 Low back pain: Secondary | ICD-10-CM

## 2012-02-29 DIAGNOSIS — M5431 Sciatica, right side: Secondary | ICD-10-CM

## 2012-03-04 ENCOUNTER — Ambulatory Visit
Admission: RE | Admit: 2012-03-04 | Discharge: 2012-03-04 | Disposition: A | Discharge status: Home / Self Care | Payer: 59 | Source: Ambulatory Visit | Attending: Sports Medicine | Admitting: Sports Medicine

## 2012-03-04 DIAGNOSIS — M5431 Sciatica, right side: Secondary | ICD-10-CM

## 2012-03-04 DIAGNOSIS — M545 Low back pain: Secondary | ICD-10-CM

## 2012-03-20 ENCOUNTER — Other Ambulatory Visit: Payer: Self-pay | Admitting: Family Medicine

## 2012-03-20 NOTE — Telephone Encounter (Signed)
Medicine called to [facility]. 

## 2012-03-23 ENCOUNTER — Other Ambulatory Visit: Payer: Self-pay | Admitting: Family Medicine

## 2012-03-23 DIAGNOSIS — E785 Hyperlipidemia, unspecified: Secondary | ICD-10-CM

## 2012-03-23 DIAGNOSIS — Z0000 Encounter for general adult medical examination without abnormal findings: Secondary | ICD-10-CM

## 2012-03-23 DIAGNOSIS — I10 Essential (primary) hypertension: Secondary | ICD-10-CM

## 2012-03-26 ENCOUNTER — Other Ambulatory Visit (INDEPENDENT_AMBULATORY_CARE_PROVIDER_SITE_OTHER): Payer: 59

## 2012-03-26 ENCOUNTER — Encounter: Payer: Self-pay | Admitting: *Deleted

## 2012-03-26 DIAGNOSIS — E785 Hyperlipidemia, unspecified: Secondary | ICD-10-CM

## 2012-03-26 DIAGNOSIS — I10 Essential (primary) hypertension: Secondary | ICD-10-CM

## 2012-03-26 LAB — LIPID PANEL
Cholesterol: 177 mg/dL (ref 0–200)
LDL Cholesterol: 118 mg/dL — ABNORMAL HIGH (ref 0–99)
Triglycerides: 50 mg/dL (ref 0.0–149.0)

## 2012-03-26 LAB — COMPREHENSIVE METABOLIC PANEL
Albumin: 3.6 g/dL (ref 3.5–5.2)
Alkaline Phosphatase: 51 U/L (ref 39–117)
BUN: 17 mg/dL (ref 6–23)
GFR: 79.13 mL/min (ref >60.00)
Glucose, Bld: 100 mg/dL — ABNORMAL HIGH (ref 70–99)
Potassium: 4.8 mEq/L (ref 3.5–5.1)
Total Bilirubin: 0.8 mg/dL (ref 0.3–1.2)

## 2012-03-27 ENCOUNTER — Other Ambulatory Visit: Payer: Self-pay | Admitting: Family Medicine

## 2012-03-30 ENCOUNTER — Encounter: Payer: 59 | Admitting: Family Medicine

## 2012-04-27 ENCOUNTER — Other Ambulatory Visit: Payer: Self-pay | Admitting: Family Medicine

## 2012-05-02 ENCOUNTER — Encounter: Payer: Self-pay | Admitting: Family Medicine

## 2012-05-02 ENCOUNTER — Ambulatory Visit (INDEPENDENT_AMBULATORY_CARE_PROVIDER_SITE_OTHER): Payer: 59 | Admitting: Family Medicine

## 2012-05-02 ENCOUNTER — Other Ambulatory Visit: Payer: Self-pay | Admitting: Family Medicine

## 2012-05-02 VITALS — BP 110/80 | HR 60 | Temp 98.0°F | Ht 63.75 in | Wt 179.0 lb

## 2012-05-02 DIAGNOSIS — R7309 Other abnormal glucose: Secondary | ICD-10-CM

## 2012-05-02 DIAGNOSIS — Z0000 Encounter for general adult medical examination without abnormal findings: Secondary | ICD-10-CM

## 2012-05-02 DIAGNOSIS — E785 Hyperlipidemia, unspecified: Secondary | ICD-10-CM

## 2012-05-02 DIAGNOSIS — R739 Hyperglycemia, unspecified: Secondary | ICD-10-CM

## 2012-05-02 MED ORDER — ATENOLOL 25 MG PO TABS: 12.5000 mg | ORAL_TABLET | Freq: Two times a day (BID) | ORAL | Status: DC

## 2012-05-02 MED ORDER — ALPRAZOLAM 0.25 MG PO TABS: ORAL_TABLET | ORAL | Status: DC

## 2012-05-02 NOTE — Progress Notes (Signed)
Subjective:    Patient ID: Elizabeth Fleming, female    DOB: February 20, 1966, 46 y.o.   MRN: [id number]  HPI 46 yo here for CPX.  Doing well.  Continues to lose weight with diet and exercise- has lost a total of 94 pounds.  Does not know if she has a family h/o uterine, ovarian or breast CA because she is adopted.  Last pap smear was normal and done here last year- 04/05/2011.  Seasonal allergies- have been well controlled with daily Zyrtec, 10 mg.  Hyperglyemcia- fasting CBG remains elevated at 112 but TG and LDL markedly improved. a1c 5.6 last year.  On Metformin 500 mg twice daily.    Lab Results  Component Value Date   HGBA1C 5.2 09/26/2011   Lab Results  Component Value Date   CHOL 177 03/26/2012   HDL 48.60 03/26/2012   LDLCALC 118* 03/26/2012   LDLDIRECT 142.5 04/28/2010   TRIG 50.0 03/26/2012   CHOLHDL 4 03/26/2012   HTN- well controlled on atenolol 25 mg daily but she does feel after a few hours, like her heart rate is increasing.  No CP or SOB.   Review of Systems Patient reports no  vision/ hearing changes,anorexia, weight change, fever ,adenopathy, persistant / recurrent hoarseness, swallowing issues, chest pain, edema,persistant / recurrent cough, hemoptysis, dyspnea(rest, exertional, paroxysmal nocturnal), gastrointestinal  bleeding (melena, rectal bleeding), abdominal pain, excessive heart burn, GU symptoms(dysuria, hematuria, pyuria, voiding/incontinence  Issues) syncope, focal weakness, severe memory loss, concerning skin lesions, depression, anxiety, abnormal bruising/bleeding, major joint swelling, breast masses or abnormal vaginal bleeding.      Past Medical History  Diagnosis Date  . Hypertension   . Depression   . Migraines   . Allergy   . Colon polyps 2012  . Genital warts     Past Surgical History  Procedure Date  . Cholecystectomy   . Tonsillectomy 1974    History   Social History  . Marital Status: Married    Spouse Name: Vida Roller   Number of Children: 2  . Years of Education: 14   Occupational History  . Health Advisor     [employer]   Social History Main Topics  . Smoking status: Former Smoker    Quit date: 08/27/2006  . Smokeless tobacco: Not on file  . Alcohol Use: No  . Drug Use: No  . Sexually Active: Yes   Other Topics Concern  . Not on file   Social History Narrative  . No narrative on file    Family History  Problem Relation Age of Onset  . Adopted: Yes    Allergies  Allergen Reactions  . Iodine     Current Outpatient Prescriptions on File Prior to Visit  Medication Sig Dispense Refill  . albuterol (PROVENTIL HFA;VENTOLIN HFA) 108 (90 BASE) MCG/ACT inhaler Inhale 2 puffs into the lungs every 6 (six) hours as needed for wheezing.  1 Inhaler  1  . ALPRAZolam (XANAX) 0.25 MG tablet TAKE ONE TABLET BY MOUTH THREE TIMES DAILY AS NEEDED  90 tablet  0  . atenolol (TENORMIN) 25 MG tablet TAKE ONE TABLET BY MOUTH EVERY DAY  90 tablet  0  . cetirizine (ZYRTEC) 10 MG tablet Take 10 mg by mouth daily.        . cyclobenzaprine (FLEXERIL) 5 MG tablet TAKE ONE TABLET BY MOUTH THREE TIMES DAILY AS NEEDED FOR MUSCLE SPASM  30 tablet  0  . fish oil-omega-3 fatty acids 1000 MG capsule Take 1 g by  mouth daily.        . fluticasone (FLONASE) 50 MCG/ACT nasal spray 2 sprays by Nasal route daily.  16 g  12  . metFORMIN (GLUCOPHAGE) 500 MG tablet TAKE ONE TABLET BY MOUTH TWICE DAILY WITH MEALS  60 tablet  0          Objective:   Physical Exam BP 110/80  Pulse 60  Temp 98 F (36.7 C)  Ht 5' 3.75" (1.619 m)  Wt 179 lb (81.194 kg)  BMI 30.97 kg/m2 Wt Readings from Last 3 Encounters:  05/02/12 179 lb (81.194 kg)  10/17/11 197 lb (89.359 kg)  04/05/11 227 lb 8 oz (103.193 kg)    BP 110/80  Pulse 60  Temp 98 F (36.7 C)  Ht 5' 3.75" (1.619 m)  Wt 179 lb (81.194 kg)  BMI 30.97 kg/m2  General:  Well-developed,well-nourished,in no acute distress; alert,appropriate and cooperative throughout  examination Head:  normocephalic and atraumatic.   Eyes:  vision grossly intact, pupils equal, pupils round, and pupils reactive to light.   Ears:  R ear normal and L ear normal.   Nose:  no external deformity.   Mouth:  good dentition.   Neck:  No deformities, masses, or tenderness noted. Breasts:  No mass, nodules, thickening, tenderness, bulging, retraction, inflamation, nipple discharge or skin changes noted.   Lungs:  Normal respiratory effort, chest expands symmetrically. Lungs are clear to auscultation, no crackles or wheezes. Heart:  Normal rate and regular rhythm. S1 and S2 normal without gallop, murmur, click, rub or other extra sounds. Abdomen:  Bowel sounds positive,abdomen soft and non-tender without masses, organomegaly or hernias noted. Msk:  No deformity or scoliosis noted of thoracic or lumbar spine.   Extremities:  No clubbing, cyanosis, edema, or deformity noted with normal full range of motion of all joints.   Neurologic:  alert & oriented X3 and gait normal.   Skin:  Intact without suspicious lesions or rashes Cervical Nodes:  No lymphadenopathy noted Axillary Nodes:  No palpable lymphadenopathy Psych:  Cognition and judgment appear intact. Alert and cooperative with normal attention span and concentration. No apparent delusions, illusions, hallucinations   Assessment & Plan:   1. Routine general medical examination at a health care facility  Reviewed preventive care protocols, scheduled due services, and updated immunizations Discussed nutrition, exercise, diet, and healthy lifestyle.    2 HYPERLIPIDEMIA  Much improved with diet and exercise. Metformin appears to have also helped with HDL!   3. HYPERGLYCEMIA, FASTING  Improved.  Continue Metformin 500 mg twice daily. metFORMIN (GLUCOPHAGE) 500    4.  HTN- Normotensive, pulse in 60s.  I would be concerned about hypotension and bradycardia if we increased her dose of atenolol. We will try to change dose to twice  daily- 12.5 mg twice daily instead of 25 mg daily.  She will call me with an update in next week or two. The patient indicates understanding of these issues and agrees with the plan.

## 2012-05-02 NOTE — Patient Instructions (Addendum)
Great to see you. Have a Wonderful thanksgiving. Let's chang your atenolol to 12.5 mg twice daily.  Call me if you not feeling an improvement.

## 2012-05-17 ENCOUNTER — Telehealth: Payer: Self-pay

## 2012-05-17 DIAGNOSIS — R198 Other specified symptoms and signs involving the digestive system and abdomen: Secondary | ICD-10-CM

## 2012-05-17 NOTE — Telephone Encounter (Signed)
Advised patient, lab appt scheduled.

## 2012-05-17 NOTE — Telephone Encounter (Signed)
Order entered

## 2012-05-17 NOTE — Telephone Encounter (Signed)
Pt left v/m requesting lab order for celiac disease (Previously discussed symptoms with Dr Patrick Jupiter patient request call back.

## 2012-05-18 ENCOUNTER — Other Ambulatory Visit (INDEPENDENT_AMBULATORY_CARE_PROVIDER_SITE_OTHER): Payer: 59

## 2012-05-18 DIAGNOSIS — R198 Other specified symptoms and signs involving the digestive system and abdomen: Secondary | ICD-10-CM

## 2012-05-21 LAB — GLIA (IGA/G) + TTG IGA
Gliadin IgA: 2.5 U/mL (ref <20)
Gliadin IgG: 5.8 U/mL (ref <20)
Tissue Transglutaminase Ab, IgA: 2.6 U/mL (ref <20)

## 2012-05-29 ENCOUNTER — Other Ambulatory Visit: Payer: Self-pay | Admitting: Family Medicine

## 2012-05-29 NOTE — Telephone Encounter (Signed)
rx called in

## 2012-06-01 ENCOUNTER — Other Ambulatory Visit: Payer: Self-pay | Admitting: Family Medicine

## 2012-07-23 ENCOUNTER — Other Ambulatory Visit: Payer: Self-pay | Admitting: Family Medicine

## 2012-07-23 NOTE — Telephone Encounter (Signed)
Already done

## 2012-07-23 NOTE — Telephone Encounter (Signed)
Medicine called to [facility]. 

## 2012-09-07 ENCOUNTER — Other Ambulatory Visit: Payer: Self-pay | Admitting: Family Medicine

## 2012-09-08 ENCOUNTER — Other Ambulatory Visit: Payer: Self-pay | Admitting: Family Medicine

## 2012-09-10 NOTE — Telephone Encounter (Signed)
Phoned in to pharmacy. 

## 2012-09-13 ENCOUNTER — Other Ambulatory Visit: Payer: Self-pay | Admitting: Family Medicine

## 2012-10-16 ENCOUNTER — Other Ambulatory Visit: Payer: Self-pay | Admitting: Family Medicine

## 2012-10-16 NOTE — Telephone Encounter (Signed)
Electronic refill request.  Please advise. 

## 2012-10-17 ENCOUNTER — Other Ambulatory Visit: Payer: Self-pay | Admitting: Family Medicine

## 2012-10-17 NOTE — Telephone Encounter (Signed)
Alprazolam called to [facility] 

## 2012-12-03 ENCOUNTER — Other Ambulatory Visit: Payer: Self-pay | Admitting: *Deleted

## 2012-12-03 MED ORDER — ALPRAZOLAM 0.25 MG PO TABS: ORAL_TABLET | ORAL | Status: DC

## 2012-12-03 NOTE — Telephone Encounter (Signed)
Xanax, sorry.

## 2012-12-03 NOTE — Telephone Encounter (Signed)
Which medication ?

## 2012-12-03 NOTE — Telephone Encounter (Signed)
Received faxed refill request from pharmacy. Last office visit 05/02/12. Is it okay to refill medication?

## 2012-12-03 NOTE — Telephone Encounter (Signed)
Refill called to [facility]. 

## 2012-12-05 NOTE — Telephone Encounter (Signed)
Pt request status of alprazolam refill. Spoke with Lawson Fiscal at [facility], note on refill but not sure why. Medication given to Lawson Fiscal at [facility] [address] [facility] as instructed. Pt notified can pick up rx in 2 hrs. Pt voiced understanding.

## 2013-03-05 ENCOUNTER — Ambulatory Visit (INDEPENDENT_AMBULATORY_CARE_PROVIDER_SITE_OTHER): Payer: 59 | Admitting: Family Medicine

## 2013-03-05 ENCOUNTER — Encounter: Payer: Self-pay | Admitting: Family Medicine

## 2013-03-05 VITALS — BP 122/82 | HR 64 | Temp 98.9°F | Wt 209.2 lb

## 2013-03-05 DIAGNOSIS — F4323 Adjustment disorder with mixed anxiety and depressed mood: Secondary | ICD-10-CM

## 2013-03-05 DIAGNOSIS — F418 Other specified anxiety disorders: Secondary | ICD-10-CM | POA: Insufficient documentation

## 2013-03-05 MED ORDER — SCOPOLAMINE 1 MG/3DAYS TD PT72: 1.0000 | MEDICATED_PATCH | TRANSDERMAL | Status: DC

## 2013-03-05 MED ORDER — BUPROPION HCL ER (XL) 150 MG PO TB24: 150.0000 mg | ORAL_TABLET | ORAL | Status: DC

## 2013-03-05 NOTE — Progress Notes (Signed)
Subjective:    Patient ID: Elizabeth Fleming, female    DOB: 1965-10-15, 47 y.o.   MRN: [id number]  HPI  47 yo pleasant female here to discuss ?situationonal depression.  Has not been able to exercise given recent foot issues and has gained weight which has affected her mood. Feels more anhedonia, tearful.  Sleeping ok.  Denies panic attacks.  No SI or HI.  Does have prn xanax which she uses.  Going back to work soon- which she is a little anxious about but hopes it will help her mood.  Was on Prozac and Wellbutrin previously. Patient Active Problem List   Diagnosis Date Noted  . Adjustment disorder with mixed anxiety and depressed mood 03/05/2013  . Tick bite 10/17/2011  . Routine general medical examination at a health care facility 04/05/2011  . Pelvic pain in female 08/27/2010  . GERD 02/22/2010  . CHANGE IN BOWELS 02/17/2010  . OBESITY 12/01/2009  . INCONTINENCE, URGE 12/01/2009  . DYSPNEA ON EXERTION 10/01/2009  . CHEST PAIN, ATYPICAL 10/01/2009  . HYPERGLYCEMIA, FASTING 05/26/2009  . ADJUSTMENT DISORDER WITH ANXIETY 11/14/2008  . URINARY URGENCY 05/09/2008  . ALLERGIC RHINITIS 11/18/2007  . HYPERLIPIDEMIA 11/16/2007  . MIGRAINE WITH AURA 11/16/2007  . HYPERTENSION 11/16/2007   Past Medical History  Diagnosis Date  . Hypertension   . Depression   . Migraines   . Allergy   . Colon polyps 2012  . Genital warts    Past Surgical History  Procedure Laterality Date  . Cholecystectomy    . Tonsillectomy  1974   History  Substance Use Topics  . Smoking status: Former Smoker    Quit date: 08/27/2006  . Smokeless tobacco: Not on file  . Alcohol Use: No   Family History  Problem Relation Age of Onset  . Adopted: Yes   Allergies  Allergen Reactions  . Iodine    Current Outpatient Prescriptions on File Prior to Visit  Medication Sig Dispense Refill  . ALPRAZolam (XANAX) 0.25 MG tablet TAKE ONE TABLET BY MOUTH THREE TIMES DAILY AS NEEDED  90 tablet  0  .  atenolol (TENORMIN) 25 MG tablet TAKE ONE TABLET BY MOUTH EVERY DAY  90 tablet  1  . cetirizine (ZYRTEC) 10 MG tablet Take 10 mg by mouth daily.        . fish oil-omega-3 fatty acids 1000 MG capsule Take 1 g by mouth daily.        . metFORMIN (GLUCOPHAGE) 500 MG tablet TAKE ONE TABLET BY MOUTH TWICE DAILY WITH MEALS  60 tablet  11   No current facility-administered medications on file prior to visit.   The PMH, PSH, Social History, Family History, Medications, and allergies have been reviewed in [facility], and have been updated if relevant.   Review of Systems    See HPI Objective:   Physical Exam BP 122/82  Pulse 64  Temp(Src) 98.9 F (37.2 C) (Oral)  Wt 209 lb 4 oz (94.915 kg)  BMI 36.21 kg/m2  SpO2 99%  LMP 02/12/2013 Wt Readings from Last 3 Encounters:  03/05/13 209 lb 4 oz (94.915 kg)  05/02/12 179 lb (81.194 kg)  10/17/11 197 lb (89.359 kg)   Gen:  Alert, pleasant, NAD Psych:  Good eye contact, not anxious or depressed appearing.    Assessment & Plan:  1. Adjustment disorder with mixed anxiety and depressed mood New. >25 min spent with face to face with patient, >50% counseling and/or coordinating care. Deferred psychotherapy at this time. Start Wellbutrin  150 mg XL daily. She will update me with symptoms in a few weeks.

## 2013-03-05 NOTE — Patient Instructions (Addendum)
Have a great cruise. Send me a message in a couple week with an update- we are starting Wellbutrin 150 mg every morning.

## 2013-04-02 ENCOUNTER — Other Ambulatory Visit: Payer: Self-pay | Admitting: *Deleted

## 2013-04-02 MED ORDER — ALPRAZOLAM 0.25 MG PO TABS: ORAL_TABLET | ORAL | Status: DC

## 2013-04-02 NOTE — Telephone Encounter (Signed)
Called to [facility] [address] 

## 2013-04-02 NOTE — Telephone Encounter (Signed)
Last filled 12/05/12 

## 2013-04-05 ENCOUNTER — Encounter: Payer: Self-pay | Admitting: Podiatry

## 2013-04-05 ENCOUNTER — Ambulatory Visit (INDEPENDENT_AMBULATORY_CARE_PROVIDER_SITE_OTHER): Payer: 59

## 2013-04-05 ENCOUNTER — Ambulatory Visit (INDEPENDENT_AMBULATORY_CARE_PROVIDER_SITE_OTHER): Payer: 59 | Admitting: Podiatry

## 2013-04-05 VITALS — BP 120/78 | HR 70 | Resp 16 | Ht 65.0 in | Wt 200.0 lb

## 2013-04-05 DIAGNOSIS — Z9889 Other specified postprocedural states: Secondary | ICD-10-CM

## 2013-04-05 DIAGNOSIS — M2040 Other hammer toe(s) (acquired), unspecified foot: Secondary | ICD-10-CM

## 2013-04-05 DIAGNOSIS — R609 Edema, unspecified: Secondary | ICD-10-CM

## 2013-04-05 NOTE — Progress Notes (Signed)
Subjective:     Patient ID: Elizabeth Fleming, female   DOB: 19-May-1966, 47 y.o.   MRN: [id number]  HPI patient presents stating my foot feels well and getting around with swelling still occurring at the end of the day. 8 weeks post surgery right foot   Review of Systems  All other systems reviewed and are negative.       Objective:   Physical Exam  Nursing note and vitals reviewed. Constitutional: She is oriented to person, place, and time.  Cardiovascular: Intact distal pulses.   Musculoskeletal: Normal range of motion.  Neurological: She is oriented to person, place, and time.   Patient's right foot looks good with mild edema and good structural alignment of the second toe with no discomfort in the MPJ    Assessment:     Healing well post surgery right foot 8 weeks    Plan:     X-ray reviewed and allowing patient to return to increased activity with the continuation of compression. Reappoint 6 weeks

## 2013-04-08 ENCOUNTER — Encounter: Payer: Self-pay | Admitting: Podiatry

## 2013-04-11 ENCOUNTER — Other Ambulatory Visit: Payer: Self-pay

## 2013-04-22 ENCOUNTER — Other Ambulatory Visit: Payer: Self-pay | Admitting: *Deleted

## 2013-04-22 ENCOUNTER — Other Ambulatory Visit: Payer: Self-pay | Admitting: Family Medicine

## 2013-04-22 MED ORDER — BUPROPION HCL ER (XL) 150 MG PO TB24: 150.0000 mg | ORAL_TABLET | ORAL | Status: DC

## 2013-04-22 MED ORDER — ATENOLOL 25 MG PO TABS: ORAL_TABLET | ORAL | Status: DC

## 2013-04-22 NOTE — Telephone Encounter (Signed)
Pt called back for status of refill on atenolol and wellbutrin; atenolol has already been refilled. Pt said she is doing well on wellbutrin and request  refill to [facility] [address] Today. Please advise.

## 2013-04-22 NOTE — Telephone Encounter (Signed)
This was filled 2 weeks ago - this is a controlled substance and cannot be filled early -I think she said she lost her medicine but if she thinks it was stolen this should be reported to the police

## 2013-04-22 NOTE — Telephone Encounter (Signed)
Dr Milinda Antis said OK to refill wellbutrin but alprazolam will need to wait until 04/23/13.pt advised and voiced understanding.Medication phoned to [facility] [address] pharmacy as instructed.spoke with [facility] and she has received the atenolol rx also.

## 2013-04-22 NOTE — Telephone Encounter (Signed)
Pt lost all medications. She said it was 4 of them and she went to the pharmacy and they are faxing over refill request for those and they wanted her to give [place] a call to let us know because she has missed taking those medications this morning and is needing those as soon as possible. Pt is at [facility] on [address] and she is there now.

## 2013-04-23 MED ORDER — ALPRAZOLAM 0.25 MG PO TABS: ORAL_TABLET | ORAL | Status: DC

## 2013-04-23 NOTE — Telephone Encounter (Signed)
Rx called to pharmacy as instructed. 

## 2013-04-23 NOTE — Telephone Encounter (Signed)
Ok to phone in.

## 2013-05-24 ENCOUNTER — Other Ambulatory Visit: Payer: Self-pay | Admitting: Internal Medicine

## 2013-05-24 ENCOUNTER — Other Ambulatory Visit: Payer: Self-pay | Admitting: Family Medicine

## 2013-05-24 ENCOUNTER — Encounter: Payer: 59 | Admitting: Podiatry

## 2013-05-24 MED ORDER — METFORMIN HCL 500 MG PO TABS: 500.0000 mg | ORAL_TABLET | Freq: Two times a day (BID) | ORAL | Status: DC

## 2013-05-24 MED ORDER — ALPRAZOLAM 0.25 MG PO TABS: ORAL_TABLET | ORAL | Status: DC

## 2013-05-24 MED ORDER — BUPROPION HCL ER (XL) 150 MG PO TB24: 150.0000 mg | ORAL_TABLET | ORAL | Status: DC

## 2013-05-24 NOTE — Telephone Encounter (Signed)
rx called into pharmacy

## 2013-05-28 ENCOUNTER — Encounter: Payer: Self-pay | Admitting: Family Medicine

## 2013-05-28 DIAGNOSIS — E669 Obesity, unspecified: Secondary | ICD-10-CM

## 2013-05-28 DIAGNOSIS — E785 Hyperlipidemia, unspecified: Secondary | ICD-10-CM

## 2013-05-28 DIAGNOSIS — I10 Essential (primary) hypertension: Secondary | ICD-10-CM

## 2013-05-28 NOTE — Telephone Encounter (Signed)
Elizabeth Fleming, can you fax this to the requested area for this pt- it looks like she has already made an appt

## 2013-06-14 ENCOUNTER — Ambulatory Visit (INDEPENDENT_AMBULATORY_CARE_PROVIDER_SITE_OTHER): Payer: Commercial Managed Care - PPO | Admitting: Podiatry

## 2013-06-14 ENCOUNTER — Encounter: Payer: Self-pay | Admitting: Podiatry

## 2013-06-14 ENCOUNTER — Ambulatory Visit (INDEPENDENT_AMBULATORY_CARE_PROVIDER_SITE_OTHER): Payer: Commercial Managed Care - PPO

## 2013-06-14 VITALS — BP 130/88 | HR 78 | Resp 16

## 2013-06-14 DIAGNOSIS — Z9889 Other specified postprocedural states: Secondary | ICD-10-CM

## 2013-06-14 DIAGNOSIS — M216X9 Other acquired deformities of unspecified foot: Secondary | ICD-10-CM

## 2013-06-14 NOTE — Progress Notes (Signed)
Still having problems with the big toe , second toe is good

## 2013-06-16 NOTE — Progress Notes (Signed)
Subjective:     Patient ID: Elizabeth Fleming, female   DOB: 06-22-1965, 48 y.o.   MRN: [id number]  HPI patient presents after having osteotomy surgery approximately 4 months ago right foot stating she is doing fine with some pain around the big toe but nothing that bothers her that much   Review of Systems     Objective:   Physical Exam Neurovascular status intact with no health history changes noted and well-healed second MPJ site right    Assessment:     Doing well with surgery with a mild amount of pain on the medial right hallux which could be a small nerve entrapment or localized inflammation    Plan:     X-ray reviewed patient discharged and less the hallux to bother her where we will consider cortisone injection. Reappoint as needed

## 2013-06-19 ENCOUNTER — Ambulatory Visit: Payer: Self-pay | Admitting: Internal Medicine

## 2013-06-22 ENCOUNTER — Emergency Department: Payer: Self-pay | Admitting: Emergency Medicine

## 2013-06-28 ENCOUNTER — Encounter: Payer: Self-pay | Admitting: Podiatry

## 2013-06-28 ENCOUNTER — Ambulatory Visit (INDEPENDENT_AMBULATORY_CARE_PROVIDER_SITE_OTHER): Payer: Commercial Managed Care - PPO

## 2013-06-28 ENCOUNTER — Ambulatory Visit (INDEPENDENT_AMBULATORY_CARE_PROVIDER_SITE_OTHER): Payer: Commercial Managed Care - PPO | Admitting: Podiatry

## 2013-06-28 VITALS — BP 127/75 | HR 77 | Resp 16 | Ht 65.0 in | Wt 200.0 lb

## 2013-06-28 DIAGNOSIS — S92309A Fracture of unspecified metatarsal bone(s), unspecified foot, initial encounter for closed fracture: Secondary | ICD-10-CM

## 2013-06-28 DIAGNOSIS — M79673 Pain in unspecified foot: Secondary | ICD-10-CM

## 2013-06-28 DIAGNOSIS — M79609 Pain in unspecified limb: Secondary | ICD-10-CM

## 2013-06-28 NOTE — Progress Notes (Signed)
Subjective:     Patient ID: Elizabeth Fleming, female   DOB: 11/06/1965, 48 y.o.   MRN: [id number]  HPI patient states that she turned her foot several days ago felt a snap went to the emergency room and has a broken bone in her foot   Review of Systems  All other systems reviewed and are negative.       Objective:   Physical Exam  Nursing note and vitals reviewed. Constitutional: She is oriented to person, place, and time.  Musculoskeletal: Normal range of motion.  Neurological: She is oriented to person, place, and time.  Skin: Skin is warm.   neurovascular status intact with mild edema in the left foot and significant discomfort in the outside of the foot consistent with fracture     Assessment:     Fracture base of fifth metatarsal left secondary to turned injury    Plan:     Reviewed H&P and x-rays and discussed correction versus letting it heal versus possible removal of fragment in future patient would prefer not to do surgery but I am to have Dr. Milinda Fleming take a look at this on Monday and if he feels it would be better fixed we will contact her and consider surgery on this at the end of next week as I will be out of town next week. If not we will try to let it heal with her understanding that it may need to be fixed later or fragment removed

## 2013-07-07 ENCOUNTER — Ambulatory Visit: Payer: Self-pay | Admitting: Internal Medicine

## 2013-07-16 ENCOUNTER — Encounter: Payer: Self-pay | Admitting: Podiatry

## 2013-07-19 ENCOUNTER — Encounter: Payer: Self-pay | Admitting: Family Medicine

## 2013-07-19 MED ORDER — ATENOLOL 25 MG PO TABS: ORAL_TABLET | ORAL | Status: DC

## 2013-07-26 ENCOUNTER — Ambulatory Visit: Payer: Commercial Managed Care - PPO | Admitting: Podiatry

## 2013-07-30 ENCOUNTER — Encounter: Payer: Self-pay | Admitting: Podiatry

## 2013-07-30 ENCOUNTER — Ambulatory Visit (INDEPENDENT_AMBULATORY_CARE_PROVIDER_SITE_OTHER): Payer: Commercial Managed Care - PPO | Admitting: Podiatry

## 2013-07-30 ENCOUNTER — Ambulatory Visit (INDEPENDENT_AMBULATORY_CARE_PROVIDER_SITE_OTHER): Payer: Commercial Managed Care - PPO

## 2013-07-30 VITALS — BP 119/75 | HR 77 | Resp 16 | Ht 65.0 in | Wt 210.0 lb

## 2013-07-30 DIAGNOSIS — M79609 Pain in unspecified limb: Secondary | ICD-10-CM

## 2013-07-30 DIAGNOSIS — M79673 Pain in unspecified foot: Secondary | ICD-10-CM

## 2013-07-30 DIAGNOSIS — L600 Ingrowing nail: Secondary | ICD-10-CM

## 2013-07-30 DIAGNOSIS — S92309A Fracture of unspecified metatarsal bone(s), unspecified foot, initial encounter for closed fracture: Secondary | ICD-10-CM

## 2013-07-30 DIAGNOSIS — G5790 Unspecified mononeuropathy of unspecified lower limb: Secondary | ICD-10-CM

## 2013-07-30 NOTE — Progress Notes (Signed)
Subjective:     Patient ID: Elizabeth Fleming, female   DOB: November 14, 1965, 48 y.o.   MRN: [id number]  HPI patient presents stating it's feeling about 50% better but still sore if I am not wearing my boot. I am for the most part also using my rollabout when I am at work   Review of Systems     Objective:   Physical Exam Neurovascular status intact with significant diminishment of discomfort lateral side left foot with minimal edema and no indications of muscle weakness or loss. Pain still present with palpation but improved    Assessment:     Fracture of the base of the fifth metatarsal left the we are attempting to heal without surgery with some improvement clinically    Plan:     H&P and x-rays reviewed. I did advise that this will require continued immobilization and there is a good chance it will require surgery or removal of piece of bone. I explained that to the patient she completely is aware but does not want surgery now would rather try to continue to use immobilization and see whether or not this will heal. She is encouraged to gradually increase activity so we can see whether she can tolerate

## 2013-07-31 ENCOUNTER — Encounter: Payer: Self-pay | Admitting: Family Medicine

## 2013-07-31 ENCOUNTER — Other Ambulatory Visit: Payer: Self-pay | Admitting: Family Medicine

## 2013-07-31 DIAGNOSIS — N920 Excessive and frequent menstruation with regular cycle: Secondary | ICD-10-CM

## 2013-08-12 ENCOUNTER — Encounter: Payer: Self-pay | Admitting: Family Medicine

## 2013-08-12 ENCOUNTER — Other Ambulatory Visit (HOSPITAL_COMMUNITY)
Admission: RE | Admit: 2013-08-12 | Discharge: 2013-08-12 | Disposition: A | Discharge status: Home / Self Care | Payer: 59 | Source: Ambulatory Visit | Attending: Family Medicine | Admitting: Family Medicine

## 2013-08-12 ENCOUNTER — Ambulatory Visit (INDEPENDENT_AMBULATORY_CARE_PROVIDER_SITE_OTHER): Payer: 59 | Admitting: Family Medicine

## 2013-08-12 VITALS — BP 126/70 | HR 76 | Temp 97.9°F | Ht 63.25 in | Wt 225.0 lb

## 2013-08-12 DIAGNOSIS — R7309 Other abnormal glucose: Secondary | ICD-10-CM

## 2013-08-12 DIAGNOSIS — F4323 Adjustment disorder with mixed anxiety and depressed mood: Secondary | ICD-10-CM

## 2013-08-12 DIAGNOSIS — Z0000 Encounter for general adult medical examination without abnormal findings: Secondary | ICD-10-CM

## 2013-08-12 DIAGNOSIS — N926 Irregular menstruation, unspecified: Secondary | ICD-10-CM

## 2013-08-12 DIAGNOSIS — I10 Essential (primary) hypertension: Secondary | ICD-10-CM

## 2013-08-12 DIAGNOSIS — N920 Excessive and frequent menstruation with regular cycle: Secondary | ICD-10-CM

## 2013-08-12 DIAGNOSIS — R739 Hyperglycemia, unspecified: Secondary | ICD-10-CM

## 2013-08-12 DIAGNOSIS — Z1231 Encounter for screening mammogram for malignant neoplasm of breast: Secondary | ICD-10-CM

## 2013-08-12 DIAGNOSIS — B379 Candidiasis, unspecified: Secondary | ICD-10-CM

## 2013-08-12 DIAGNOSIS — Z01419 Encounter for gynecological examination (general) (routine) without abnormal findings: Secondary | ICD-10-CM | POA: Insufficient documentation

## 2013-08-12 DIAGNOSIS — E785 Hyperlipidemia, unspecified: Secondary | ICD-10-CM

## 2013-08-12 LAB — COMPREHENSIVE METABOLIC PANEL
ALBUMIN: 3.9 g/dL (ref 3.5–5.2)
ALT: 22 U/L (ref 0–35)
AST: 31 U/L (ref 0–37)
Alkaline Phosphatase: 83 U/L (ref 39–117)
BUN: 18 mg/dL (ref 6–23)
CALCIUM: 9.4 mg/dL (ref 8.4–10.5)
CHLORIDE: 102 meq/L (ref 96–112)
CO2: 29 mEq/L (ref 19–32)
Creatinine, Ser: 0.9 mg/dL (ref 0.4–1.2)
GFR: 75.48 mL/min (ref >60.00)
Glucose, Bld: 96 mg/dL (ref 70–99)
POTASSIUM: 4.4 meq/L (ref 3.5–5.1)
Sodium: 138 mEq/L (ref 135–145)
TOTAL PROTEIN: 7.3 g/dL (ref 6.0–8.3)
Total Bilirubin: 0.9 mg/dL (ref 0.3–1.2)

## 2013-08-12 LAB — LIPID PANEL
CHOL/HDL RATIO: 4
Cholesterol: 228 mg/dL — ABNORMAL HIGH (ref 0–200)
HDL: 61.6 mg/dL (ref >39.00)
LDL Cholesterol: 147 mg/dL — ABNORMAL HIGH (ref 0–99)
TRIGLYCERIDES: 97 mg/dL (ref 0.0–149.0)
VLDL: 19.4 mg/dL (ref 0.0–40.0)

## 2013-08-12 LAB — CBC WITH DIFFERENTIAL/PLATELET
BASOS PCT: 0.5 % (ref 0.0–3.0)
Basophils Absolute: 0 10*3/uL (ref 0.0–0.1)
Eosinophils Absolute: 0.3 10*3/uL (ref 0.0–0.7)
Eosinophils Relative: 4.2 % (ref 0.0–5.0)
HEMATOCRIT: 37.4 % (ref 36.0–46.0)
HEMOGLOBIN: 12.5 g/dL (ref 12.0–15.0)
LYMPHS ABS: 1.9 10*3/uL (ref 0.7–4.0)
Lymphocytes Relative: 22.5 % (ref 12.0–46.0)
MCHC: 33.4 g/dL (ref 30.0–36.0)
MCV: 89 fl (ref 78.0–100.0)
Monocytes Absolute: 0.6 10*3/uL (ref 0.1–1.0)
Monocytes Relative: 7.1 % (ref 3.0–12.0)
Neutro Abs: 5.4 10*3/uL (ref 1.4–7.7)
Neutrophils Relative %: 65.7 % (ref 43.0–77.0)
Platelets: 242 10*3/uL (ref 150.0–400.0)
RBC: 4.2 Mil/uL (ref 3.87–5.11)
RDW: 13.1 % (ref 11.5–14.6)
WBC: 8.2 10*3/uL (ref 4.5–10.5)

## 2013-08-12 LAB — HEMOGLOBIN A1C: Hgb A1c MFr Bld: 5.2 % (ref 4.6–6.5)

## 2013-08-12 LAB — TSH: TSH: 1.55 u[IU]/mL (ref 0.35–5.50)

## 2013-08-12 NOTE — Assessment & Plan Note (Signed)
Reviewed preventive care protocols, scheduled due services, and updated immunizations Discussed nutrition, exercise, diet, and healthy lifestyle.  Mammogram ordered. Pap smear today. Labs today. Orders Placed This Encounter  Procedures  . MM Digital Screening  . Comprehensive metabolic panel  . Lipid panel  . CBC with Differential  . TSH  . Hemoglobin A1c  . Ambulatory referral to Gynecology

## 2013-08-12 NOTE — Assessment & Plan Note (Signed)
Improved with new job.

## 2013-08-12 NOTE — Progress Notes (Signed)
Pre visit review using our clinic review tool, if applicable. No additional management support is needed unless otherwise documented below in the visit note. 

## 2013-08-12 NOTE — Assessment & Plan Note (Signed)
Due for labs

## 2013-08-12 NOTE — Assessment & Plan Note (Signed)
Likely perimenopausal. Referred to GYN per pt request.

## 2013-08-12 NOTE — Patient Instructions (Addendum)
Good to see you. Please call to schedule your mammogram.  We will call you with your lab results and you can view them online.

## 2013-08-12 NOTE — Progress Notes (Signed)
Subjective:    Patient ID: Elizabeth Fleming, female    DOB: 26-Nov-1965, 48 y.o.   MRN: [id number]  HPI 48 yo pleasant female here for CPX.  Doing well.  New jobs at [employer]- much happier!  Rarely needs xanax.  Unfortunately had surgery on right foot and injured her left foot so has not been able to exercise. Wt Readings from Last 3 Encounters:  08/12/13 225 lb (102.059 kg)  07/30/13 210 lb (95.255 kg)  06/28/13 200 lb (90.719 kg)     Does not know if she has a family h/o uterine, ovarian or breast CA because she is adopted.  Last pap smear was normal and done here- 04/05/2011.  No h/o abnormal pap smears.  Past 8 weeks- periods have been heavy and very irregular.  Sent a mychart message last month asking for GYN referral.  No hot flashes, insomnia, or mood swings.  Seasonal allergies- have been well controlled with daily Zyrtec, 10 mg.     Lab Results  Component Value Date   HGBA1C 5.2 09/26/2011   Lab Results  Component Value Date   CHOL 177 03/26/2012   HDL 48.60 03/26/2012   LDLCALC 118* 03/26/2012   LDLDIRECT 142.5 04/28/2010   TRIG 50.0 03/26/2012   CHOLHDL 4 03/26/2012   HTN- well controlled on atenolol 25 mg daily but she does feel after a few hours, like her heart rate is increasing.  No CP or SOB.   Review of Systems Patient reports no  vision/ hearing changes,anorexia, weight change, fever ,adenopathy, persistant / recurrent hoarseness, swallowing issues, chest pain, edema,persistant / recurrent cough, hemoptysis, dyspnea(rest, exertional, paroxysmal nocturnal), gastrointestinal  bleeding (melena, rectal bleeding), abdominal pain, excessive heart burn, GU symptoms(dysuria, hematuria, pyuria, voiding/incontinence  Issues) syncope, focal weakness, severe memory loss, concerning skin lesions, depression, anxiety, abnormal bruising/bleeding, major joint swelling, breast masses or abnormal vaginal bleeding.      Past Medical History  Diagnosis Date  . Hypertension   .  Depression   . Migraines   . Allergy   . Colon polyps 2012  . Genital warts     Past Surgical History  Procedure Laterality Date  . Cholecystectomy    . Tonsillectomy  1974    History   Social History  . Marital Status: Married    Spouse Name: Bari Edward    Number of Children: 2  . Years of Education: 14   Occupational History  . [employer]   Social History Main Topics  . Smoking status: Former Smoker    Quit date: 08/27/2006  . Smokeless tobacco: Not on file  . Alcohol Use: No  . Drug Use: No  . Sexual Activity: Yes   Other Topics Concern  . Not on file   Social History Narrative  . No narrative on file    Family History  Problem Relation Age of Onset  . Adopted: Yes    Allergies  Allergen Reactions  . Iodine     Current Outpatient Prescriptions on File Prior to Visit  Medication Sig Dispense Refill  . ALPRAZolam (XANAX) 0.25 MG tablet TAKE ONE TABLET BY MOUTH THREE TIMES DAILY AS NEEDED  90 tablet  0  . atenolol (TENORMIN) 25 MG tablet TAKE ONE TABLET BY MOUTH EVERY DAY  90 tablet  0  . buPROPion (WELLBUTRIN XL) 150 MG 24 hr tablet Take 1 tablet (150 mg total) by mouth every morning.  90 tablet  3  . cetirizine (ZYRTEC)  10 MG tablet Take 10 mg by mouth daily.        . fish oil-omega-3 fatty acids 1000 MG capsule Take 1 g by mouth daily.        . metFORMIN (GLUCOPHAGE) 500 MG tablet Take 1 tablet (500 mg total) by mouth 2 (two) times daily with a meal.  180 tablet  3  . scopolamine (TRANSDERM-SCOP) 1.5 MG Place 1 patch (1.5 mg total) onto the skin every 3 (three) days.  10 patch  12   No current facility-administered medications on file prior to visit.          Objective:   Physical Exam  BP 126/70  Pulse 76  Temp(Src) 97.9 F (36.6 C) (Oral)  Ht 5' 3.25" (1.607 m)  Wt 225 lb (102.059 kg)  BMI 39.52 kg/m2  SpO2 98%  LMP 07/28/2013   General:  Well-developed,well-nourished,in no acute distress;  alert,appropriate and cooperative throughout examination Head:  normocephalic and atraumatic.   Eyes:  vision grossly intact, pupils equal, pupils round, and pupils reactive to light.   Ears:  R ear normal and L ear normal.   Nose:  no external deformity.   Mouth:  good dentition.   Neck:  No deformities, masses, or tenderness noted. Breasts:  No mass, nodules, thickening, tenderness, bulging, retraction, inflamation, nipple discharge or skin changes noted.   Lungs:  Normal respiratory effort, chest expands symmetrically. Lungs are clear to auscultation, no crackles or wheezes. Heart:  Normal rate and regular rhythm. S1 and S2 normal without gallop, murmur, click, rub or other extra sounds. Abdomen:  Bowel sounds positive,abdomen soft and non-tender without masses, organomegaly or hernias noted. Rectal:  no external abnormalities.   Genitalia:  Pelvic Exam:        External: normal female genitalia without lesions or masses        Vagina: normal without lesions or masses        Cervix: normal without lesions or masses        Adnexa: normal bimanual exam without masses or fullness        Uterus: normal by palpation        Pap smear: performed Msk:  No deformity or scoliosis noted of thoracic or lumbar spine.   Extremities:  No clubbing, cyanosis, edema, or deformity noted with normal full range of motion of all joints.   Neurologic:  alert & oriented X3 and gait normal.   Skin:  Intact without suspicious lesions or rashes Cervical Nodes:  No lymphadenopathy noted Axillary Nodes:  No palpable lymphadenopathy Psych:  Cognition and judgment appear intact. Alert and cooperative with normal attention span and concentration. No apparent delusions, illusions, hallucinations    Assessment & Plan:

## 2013-08-13 ENCOUNTER — Telehealth: Payer: Self-pay | Admitting: Family Medicine

## 2013-08-13 NOTE — Telephone Encounter (Signed)
Relevant patient education assigned to patient using Emmi. ° °

## 2013-08-16 MED ORDER — FLUCONAZOLE 150 MG PO TABS: 150.0000 mg | ORAL_TABLET | Freq: Once | ORAL | Status: DC

## 2013-08-16 NOTE — Addendum Note (Signed)
Addended by: Modena Nunnery on: 08/16/2013 08:46 AM   Modules accepted: Orders

## 2013-08-19 ENCOUNTER — Encounter: Payer: Self-pay | Admitting: Podiatry

## 2013-08-21 ENCOUNTER — Other Ambulatory Visit: Payer: Self-pay | Admitting: Family Medicine

## 2013-08-30 ENCOUNTER — Ambulatory Visit (INDEPENDENT_AMBULATORY_CARE_PROVIDER_SITE_OTHER): Payer: Commercial Managed Care - PPO | Admitting: Podiatry

## 2013-08-30 ENCOUNTER — Ambulatory Visit (INDEPENDENT_AMBULATORY_CARE_PROVIDER_SITE_OTHER): Payer: Commercial Managed Care - PPO

## 2013-08-30 ENCOUNTER — Encounter: Payer: Self-pay | Admitting: Family Medicine

## 2013-08-30 ENCOUNTER — Telehealth: Payer: Self-pay

## 2013-08-30 ENCOUNTER — Ambulatory Visit: Payer: Self-pay | Admitting: Family Medicine

## 2013-08-30 VITALS — Resp 16 | Ht 65.0 in | Wt 200.0 lb

## 2013-08-30 DIAGNOSIS — S92309A Fracture of unspecified metatarsal bone(s), unspecified foot, initial encounter for closed fracture: Secondary | ICD-10-CM

## 2013-08-30 DIAGNOSIS — M7750 Other enthesopathy of unspecified foot: Secondary | ICD-10-CM

## 2013-08-30 DIAGNOSIS — M79673 Pain in unspecified foot: Secondary | ICD-10-CM

## 2013-08-30 DIAGNOSIS — M79609 Pain in unspecified limb: Secondary | ICD-10-CM

## 2013-08-30 MED ORDER — TRIAMCINOLONE ACETONIDE 10 MG/ML IJ SUSP
10.0000 mg | Freq: Once | INTRAMUSCULAR | Status: AC
  Administered 2013-08-30: 10 mg

## 2013-08-30 MED ORDER — MELOXICAM 15 MG PO TABS: 15.0000 mg | ORAL_TABLET | Freq: Every day | ORAL | Status: DC

## 2013-08-30 NOTE — Telephone Encounter (Addendum)
Pt left v/m requesting cb with info of where had previous mammograms; spoke with pt and previous mammo in 2011 was done at [facility]; phone [phone].

## 2013-08-30 NOTE — Progress Notes (Signed)
Subjective:     Patient ID: Elizabeth Fleming, female   DOB: Jan 11, 1966, 48 y.o.   MRN: [id number]  HPI patient states that my left foot is feeling quite a bit better with mild discomfort after a half hour on the elliptical and I'm having pain in my right first metatarsal when I try to gait   Review of Systems     Objective:   Physical Exam Neurovascular status intact with health history unchanged and I found there to be minimal discomfort lateral side fifth metatarsal base left and quite a bit of discomfort at the plantar fascial insertion into the head of the first metatarsal right foot    Assessment:     Improving fracture left foot at this time and inflammation and pain at the insertion to the head of the first metatarsal right foot plantar    Plan:     X-rays reviewed left foot and we will continue with some immobilization ice therapy and should gradually improve with possibility for bone stimulator if we cannot get it to completely heal but the right I injected 3 mg Kenalog 5 of Xylocaine Marcaine mixture into the distal fascially slipped into the first metatarsal head and instructed on reduced activity. Reappoint her recheck

## 2013-09-02 ENCOUNTER — Encounter: Payer: Self-pay | Admitting: Family Medicine

## 2013-09-13 ENCOUNTER — Encounter: Payer: Self-pay | Admitting: Podiatry

## 2013-10-11 ENCOUNTER — Encounter: Payer: Self-pay | Admitting: Family Medicine

## 2013-10-29 ENCOUNTER — Other Ambulatory Visit: Payer: Self-pay | Admitting: Family Medicine

## 2013-10-29 MED ORDER — ATENOLOL 25 MG PO TABS: ORAL_TABLET | ORAL | Status: DC

## 2013-10-29 NOTE — Telephone Encounter (Signed)
Rx sent to requested pharmacy

## 2014-02-12 ENCOUNTER — Other Ambulatory Visit: Payer: Self-pay | Admitting: Family Medicine

## 2014-02-18 ENCOUNTER — Encounter: Payer: Self-pay | Admitting: Podiatry

## 2014-02-28 ENCOUNTER — Ambulatory Visit (INDEPENDENT_AMBULATORY_CARE_PROVIDER_SITE_OTHER): Payer: Commercial Managed Care - PPO | Admitting: Podiatry

## 2014-02-28 VITALS — BP 121/78 | HR 78 | Resp 16

## 2014-02-28 DIAGNOSIS — M79609 Pain in unspecified limb: Secondary | ICD-10-CM

## 2014-02-28 DIAGNOSIS — B079 Viral wart, unspecified: Secondary | ICD-10-CM

## 2014-02-28 NOTE — Progress Notes (Signed)
Subjective:     Patient ID: Elizabeth Fleming, female   DOB: [birth year], 48 y.o.   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 Applied sterile dressing discussed big toe joint and if it continues to persist or become worse may eventually require metatarsal phalangeal joint fusion

## 2014-03-21 ENCOUNTER — Encounter: Payer: Self-pay | Admitting: Podiatry

## 2014-03-21 ENCOUNTER — Ambulatory Visit (INDEPENDENT_AMBULATORY_CARE_PROVIDER_SITE_OTHER): Payer: Commercial Managed Care - PPO | Admitting: Podiatry

## 2014-03-21 VITALS — BP 118/70 | HR 72 | Resp 16

## 2014-03-21 DIAGNOSIS — M779 Enthesopathy, unspecified: Secondary | ICD-10-CM

## 2014-03-21 DIAGNOSIS — B078 Other viral warts: Secondary | ICD-10-CM

## 2014-03-21 DIAGNOSIS — M778 Other enthesopathies, not elsewhere classified: Secondary | ICD-10-CM

## 2014-03-21 DIAGNOSIS — L600 Ingrowing nail: Secondary | ICD-10-CM

## 2014-03-21 DIAGNOSIS — B079 Viral wart, unspecified: Secondary | ICD-10-CM

## 2014-03-21 NOTE — Progress Notes (Signed)
Subjective:     Patient ID: Elizabeth Fleming, female   DOB: [birth year], 48 y.o.   MRN: [id number]  HPI patient presents stating she is having pain in her left big toenail and the right wart seems better and she still has trouble with her right big toe joint   Review of Systems     Objective:   Physical Exam Neurovascular status intact with muscle strength adequate and no changes in health history noted. Patient continues to have instability around the first MPJ right with inflammation and well-healed site posterior aspect of the right heel and an incurvated medial hallux border secondary to an ingrown toenail    Assessment:     Instability of the first MPJ right with possible inflammatory condition and at wart that's improving right with ingrown toenail left    Plan:     H&P reviewed and recommended correction of the ingrown toenail. I explained risk of procedure and she wants this done and I went ahead infiltrated the big toe 60 mg Xylocaine Marcaine mixture remove the medial corner exposed matrix and apply chemical phenol followed by alcohol lavaged and sterile dressing. Instructed on soaks and reappoint and also discussed orthotics for the instability of the first MPJ joint right

## 2014-03-21 NOTE — Patient Instructions (Signed)

## 2014-05-13 ENCOUNTER — Other Ambulatory Visit: Payer: Self-pay | Admitting: Family Medicine

## 2014-05-13 ENCOUNTER — Other Ambulatory Visit: Payer: Self-pay | Admitting: Internal Medicine

## 2014-05-16 ENCOUNTER — Other Ambulatory Visit: Payer: Self-pay | Admitting: Family Medicine

## 2014-05-16 ENCOUNTER — Encounter: Payer: Self-pay | Admitting: Family Medicine

## 2014-06-07 ENCOUNTER — Ambulatory Visit: Payer: Self-pay | Admitting: Internal Medicine

## 2014-06-07 LAB — RAPID STREP-A WITH REFLX: Micro Text Report: NEGATIVE

## 2014-06-10 LAB — BETA STREP CULTURE(ARMC)

## 2014-06-17 ENCOUNTER — Encounter: Payer: Self-pay | Admitting: Family Medicine

## 2014-06-17 ENCOUNTER — Telehealth: Payer: Self-pay | Admitting: Family Medicine

## 2014-06-17 ENCOUNTER — Ambulatory Visit (INDEPENDENT_AMBULATORY_CARE_PROVIDER_SITE_OTHER): Payer: 59 | Admitting: Family Medicine

## 2014-06-17 VITALS — BP 116/82 | HR 74 | Temp 98.1°F | Wt 249.5 lb

## 2014-06-17 DIAGNOSIS — F4325 Adjustment disorder with mixed disturbance of emotions and conduct: Secondary | ICD-10-CM

## 2014-06-17 DIAGNOSIS — E785 Hyperlipidemia, unspecified: Secondary | ICD-10-CM

## 2014-06-17 DIAGNOSIS — I10 Essential (primary) hypertension: Secondary | ICD-10-CM

## 2014-06-17 DIAGNOSIS — F4320 Adjustment disorder, unspecified: Secondary | ICD-10-CM | POA: Insufficient documentation

## 2014-06-17 DIAGNOSIS — R739 Hyperglycemia, unspecified: Secondary | ICD-10-CM

## 2014-06-17 LAB — COMPREHENSIVE METABOLIC PANEL
ALT: 14 U/L (ref 0–35)
AST: 20 U/L (ref 0–37)
Albumin: 3.6 g/dL (ref 3.5–5.2)
Alkaline Phosphatase: 89 U/L (ref 39–117)
BUN: 13 mg/dL (ref 6–23)
CALCIUM: 8.9 mg/dL (ref 8.4–10.5)
CO2: 28 meq/L (ref 19–32)
CREATININE: 0.8 mg/dL (ref 0.4–1.2)
Chloride: 102 mEq/L (ref 96–112)
GFR: 81.75 mL/min (ref >60.00)
Glucose, Bld: 136 mg/dL — ABNORMAL HIGH (ref 70–99)
Potassium: 4.2 mEq/L (ref 3.5–5.1)
Sodium: 135 mEq/L (ref 135–145)
Total Bilirubin: 0.5 mg/dL (ref 0.2–1.2)
Total Protein: 7 g/dL (ref 6.0–8.3)

## 2014-06-17 LAB — HEMOGLOBIN A1C: Hgb A1c MFr Bld: 5.3 % (ref 4.6–6.5)

## 2014-06-17 LAB — LDL CHOLESTEROL, DIRECT: Direct LDL: 140 mg/dL

## 2014-06-17 MED ORDER — BUPROPION HCL ER (XL) 150 MG PO TB24: 150.0000 mg | ORAL_TABLET | Freq: Every morning | ORAL | Status: DC

## 2014-06-17 MED ORDER — ATENOLOL 25 MG PO TABS: 25.0000 mg | ORAL_TABLET | Freq: Every day | ORAL | Status: DC

## 2014-06-17 MED ORDER — METFORMIN HCL 500 MG PO TABS: ORAL_TABLET | ORAL | Status: DC

## 2014-06-17 NOTE — Assessment & Plan Note (Signed)
Well controlled on current dose of wellbutrin. rx refilled.

## 2014-06-17 NOTE — Telephone Encounter (Signed)
Received message from Dr. Deborra Medina to call pt.  Pt states that she thinks that writing "pt needs office visit for future refills" on her refills that is sent electronically back to her pharmacy is a HIPAA violation.  I informed her that unless we included any specific information about her medical care/condition in the note, that it was not a violation of HIPAA.  She said that she wants a note placed in her chart to say that she does not want that statement sent on any of her refills to her pharmacy and wants to receive a phone call instead.  I told her that I would place a flag in her chart but that I couldn't guarantee it wouldn't happen again the way our computer system is set up, the flag won't automatically pop up when someone goes into her chart to do a refill.  She informed me that she did not believe me regarding the HIPAA issue and that she would investigate further.

## 2014-06-17 NOTE — Patient Instructions (Addendum)
Good to see you. We will call you with your lab results.  Elizabeth Fleming will be calling you as well.

## 2014-06-17 NOTE — Assessment & Plan Note (Signed)
Due for labs- will recheck lipid panel today.

## 2014-06-17 NOTE — Assessment & Plan Note (Signed)
Well controlled on current rx. No changes made. 

## 2014-06-17 NOTE — Progress Notes (Signed)
.Patient ID: Elizabeth Fleming, female   DOB: [birth year], 49 y.o.   MRN: [id number]   Subjective:   Patient ID: Elizabeth Fleming, female    DOB: April 27, 1966, 49 y.o.   MRN: [id number]  Elizabeth Fleming is a pleasant 49 y.o. year old female who presents to clinic today with Follow-up  on 06/17/2014  HPI: Hyperglycemia- has never been diagnosed with diabetes but has had persistently elevated fasting FSBS. Taking metformin regularly.  Not having issues.  Denies any side effects.  Lab Results  Component Value Date   HGBA1C 5.2 08/12/2013   Lab Results  Component Value Date   CHOL 228* 08/12/2013   HDL 61.60 08/12/2013   LDLCALC 147* 08/12/2013   LDLDIRECT 142.5 04/28/2010   TRIG 97.0 08/12/2013   CHOLHDL 4 08/12/2013   Adjustment disorder- started Wellbutrin 150 mg Xl daily in 02/2013.  She feels it is working well and would like to continue this current dose.  BP has been well controlled on Atenolol.  Denies any dizziness or SOB.  Current Outpatient Prescriptions on File Prior to Visit  Medication Sig Dispense Refill  . ALPRAZolam (XANAX) 0.25 MG tablet TAKE ONE TABLET BY MOUTH THREE TIMES DAILY AS NEEDED 90 tablet 0  . cetirizine (ZYRTEC) 10 MG tablet Take 10 mg by mouth daily.      . fish oil-omega-3 fatty acids 1000 MG capsule Take 1 g by mouth daily.       No current facility-administered medications on file prior to visit.    Allergies  Allergen Reactions  . Iodine     Past Medical History  Diagnosis Date  . Hypertension   . Depression   . Migraines   . Allergy   . Colon polyps 2012  . Genital warts     Past Surgical History  Procedure Laterality Date  . Cholecystectomy    . Tonsillectomy  1974    Family History  Problem Relation Age of Onset  . Adopted: Yes    History   Social History  . Marital Status: Married    Spouse Name: Bari Edward    Number of Children: 2  . Years of Education: 14   Occupational History  . [employer]   Social History Main Topics  . Smoking status: Former Smoker    Quit date: 08/27/2006  . Smokeless tobacco: Not on file  . Alcohol Use: No  . Drug Use: No  . Sexual Activity: Yes   Other Topics Concern  . Not on file   Social History Narrative   The PMH, PSH, Social History, Family History, Medications, and allergies have been reviewed in [facility], and have been updated if relevant.  Review of Systems  Constitutional: Negative.   HENT: Negative.   Eyes: Negative.   Respiratory: Negative.   Cardiovascular: Negative.   Gastrointestinal: Negative.   Musculoskeletal: Negative.   Skin: Negative.   Neurological: Negative.   Hematological: Negative.   Psychiatric/Behavioral: Negative.   All other systems reviewed and are negative.      Objective:    BP 116/82 mmHg  Pulse 74  Temp(Src) 98.1 F (36.7 C) (Oral)  Wt 249 lb 8 oz (113.172 kg)  SpO2 99%  LMP 05/27/2014  Wt Readings from Last 3 Encounters:  06/17/14 249 lb 8 oz (113.172 kg)  08/30/13 200 lb (90.719 kg)  08/12/13 225 lb (102.059 kg)    Physical Exam  Constitutional: She is oriented to person, place,  and time. She appears well-developed and well-nourished.  Morbidly obese  HENT:  Head: Normocephalic.  Eyes: Conjunctivae are normal.  Neck: Normal range of motion.  Cardiovascular: Normal rate and regular rhythm.   Pulmonary/Chest: Effort normal and breath sounds normal. No respiratory distress.  Abdominal: Soft.  Musculoskeletal: Normal range of motion.  Neurological: She is alert and oriented to person, place, and time.  Skin: Skin is warm and dry.  Psychiatric: She has a normal mood and affect. Her behavior is normal. Judgment and thought content normal.  Nursing note and vitals reviewed.         Assessment & Plan:   HLD (hyperlipidemia) - Plan: LDL Cholesterol, Direct, Comprehensive metabolic panel  Hyperglycemia - Plan: Hemoglobin A1c  Essential hypertension  Adjustment disorder  with mixed disturbance of emotions and conduct No Follow-up on file.

## 2014-06-18 ENCOUNTER — Encounter: Payer: Self-pay | Admitting: Family Medicine

## 2014-08-04 ENCOUNTER — Other Ambulatory Visit: Payer: Self-pay | Admitting: Family Medicine

## 2014-08-04 ENCOUNTER — Ambulatory Visit: Payer: 59 | Admitting: Family Medicine

## 2014-08-04 ENCOUNTER — Encounter: Payer: Self-pay | Admitting: Family Medicine

## 2014-08-04 DIAGNOSIS — Z1231 Encounter for screening mammogram for malignant neoplasm of breast: Secondary | ICD-10-CM

## 2014-08-05 ENCOUNTER — Encounter: Payer: Self-pay | Admitting: Family Medicine

## 2014-08-05 ENCOUNTER — Ambulatory Visit (INDEPENDENT_AMBULATORY_CARE_PROVIDER_SITE_OTHER): Payer: 59 | Admitting: Family Medicine

## 2014-08-05 ENCOUNTER — Ambulatory Visit (INDEPENDENT_AMBULATORY_CARE_PROVIDER_SITE_OTHER)
Admission: RE | Admit: 2014-08-05 | Discharge: 2014-08-05 | Disposition: A | Discharge status: Home / Self Care | Payer: 59 | Source: Ambulatory Visit | Attending: Family Medicine | Admitting: Family Medicine

## 2014-08-05 VITALS — BP 124/72 | HR 73 | Temp 98.3°F | Wt 241.2 lb

## 2014-08-05 DIAGNOSIS — M545 Low back pain, unspecified: Secondary | ICD-10-CM

## 2014-08-05 DIAGNOSIS — R109 Unspecified abdominal pain: Secondary | ICD-10-CM

## 2014-08-05 DIAGNOSIS — M549 Dorsalgia, unspecified: Secondary | ICD-10-CM | POA: Insufficient documentation

## 2014-08-05 DIAGNOSIS — R319 Hematuria, unspecified: Secondary | ICD-10-CM

## 2014-08-05 DIAGNOSIS — N3944 Nocturnal enuresis: Secondary | ICD-10-CM

## 2014-08-05 DIAGNOSIS — R32 Unspecified urinary incontinence: Secondary | ICD-10-CM | POA: Insufficient documentation

## 2014-08-05 DIAGNOSIS — Z1239 Encounter for other screening for malignant neoplasm of breast: Secondary | ICD-10-CM

## 2014-08-05 LAB — POCT URINALYSIS DIPSTICK
Bilirubin, UA: NEGATIVE
GLUCOSE UA: NEGATIVE
Leukocytes, UA: NEGATIVE
Nitrite, UA: NEGATIVE
PROTEIN UA: NEGATIVE
Spec Grav, UA: 1.015
UROBILINOGEN UA: 0.2
pH, UA: 6

## 2014-08-05 NOTE — Progress Notes (Signed)
Subjective:   Patient ID: Elizabeth Fleming, female    DOB: 1966/01/09, 49 y.o.   MRN: [id number]  Elizabeth Fleming is a pleasant 49 y.o. year old female who presents to clinic today with Urinary Retention and Flank Pain  on 08/05/2014  HPI: Here for ? UTI.  On 1/27, felt bloated with suprapubic tenderness.  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  Two days later, sharp 7/10 left flank pain, then right flank pain later that day.  Started her period a few days later and symptoms resolved so she thought perhaps this was all related to her periods since she feels she is likely perimenopausal.  30 days later- last Thursday, began having suprapubic pressure and bloating again.  Next night, she wet the bed which is the first time she has ever done that.  She is also still having menstrual bleeding which is unusual for her.  Today, still having suprapubic pressure.  No dysuria. Not much back pain today.  Never had a fever, nausea, vomiting with these episodes.  Of note, last pap smear up to date and neg 08/12/13 and had a neg pelvic US in 09/2013 for [place] for heavy menstrual bleeding.  Family history unknown since she is adopted.  She herself has no h/o kidney stones.  She does not take calcium supplements or tums.  Current Outpatient Prescriptions on File Prior to Visit  Medication Sig Dispense Refill  . ALPRAZolam (XANAX) 0.25 MG tablet TAKE ONE TABLET BY MOUTH THREE TIMES DAILY AS NEEDED 90 tablet 0  . atenolol (TENORMIN) 25 MG tablet Take 1 tablet (25 mg total) by mouth daily. 90 tablet 1  . buPROPion (WELLBUTRIN XL) 150 MG 24 hr tablet Take 1 tablet (150 mg total) by mouth every morning. 90 tablet 0  . cetirizine (ZYRTEC) 10 MG tablet Take 10 mg by mouth daily.      . fish oil-omega-3 fatty acids 1000 MG capsule Take 1 g by mouth daily.      . metFORMIN (GLUCOPHAGE) 500 MG tablet TAKE 1 TABLET BY MOUTH 2 TIMES DAILY WITH A MEAL. 180  tablet 0   No current facility-administered medications on file prior to visit.    Allergies  Allergen Reactions  . Iodine     Past Medical History  Diagnosis Date  . Hypertension   . Depression   . Migraines   . Allergy   . Colon polyps 2012  . Genital warts     Past Surgical History  Procedure Laterality Date  . Cholecystectomy    . Tonsillectomy  1974    Family History  Problem Relation Age of Onset  . Adopted: Yes    History   Social History  . Marital Status: Married    Spouse Name: Bari Edward  . Number of Children: 2  . Years of Education: 14   Occupational History  . [employer]   Social History Main Topics  . Smoking status: Former Smoker    Quit date: 08/27/2006  . Smokeless tobacco: Not on file  . Alcohol Use: No  . Drug Use: No  . Sexual Activity: Yes   Other Topics Concern  . Not on file   Social History Narrative   The PMH, PSH, Social History, Family History, Medications, and allergies have been reviewed in [facility], and have been updated if relevant.     Review  of Systems  Constitutional: Positive for fatigue. Negative for fever and chills.  Gastrointestinal: Positive for abdominal pain and abdominal distention. Negative for nausea, vomiting, diarrhea, blood in stool and rectal pain.  Genitourinary: Positive for dysuria, frequency, flank pain, vaginal bleeding and enuresis. Negative for vaginal discharge, difficulty urinating and vaginal pain.  Musculoskeletal: Positive for back pain.  Skin: Negative.   Neurological: Negative for dizziness.  All other systems reviewed and are negative.      Objective:    BP 124/72 mmHg  Pulse 73  Temp(Src) 98.3 F (36.8 C) (Oral)  Wt 241 lb 4 oz (109.43 kg)  SpO2 99%  LMP 07/28/2014   Physical Exam  Constitutional: She is oriented to person, place, and time. She appears well-developed and well-nourished. No distress.  HENT:  Head: Normocephalic.  Eyes:  Conjunctivae are normal.  Neck: Normal range of motion.  Cardiovascular: Normal rate.   Pulmonary/Chest: Effort normal.  Abdominal: Soft. Bowel sounds are normal. She exhibits no distension and no mass. There is no tenderness. There is no rebound and no guarding.  No CVA tenderness  Musculoskeletal: She exhibits no edema.  Neurological: She is alert and oriented to person, place, and time.  Skin: Skin is warm and dry.  Psychiatric: She has a normal mood and affect. Her behavior is normal. Judgment and thought content normal.  Nursing note and vitals reviewed.         Assessment & Plan:   Flank pain - Plan: Urinalysis Dipstick, Urine culture, DG Abd 1 View, CT Abdomen Pelvis Wo Contrast  Hematuria - Plan: DG Abd 1 View, CT Abdomen Pelvis Wo Contrast  Nocturnal enuresis - Plan: DG Abd 1 View, CT Abdomen Pelvis Wo Contrast  Bilateral low back pain without sciatica - Plan: DG Abd 1 View, CT Abdomen Pelvis Wo Contrast No Follow-up on file.

## 2014-08-05 NOTE — Assessment & Plan Note (Signed)
With urinary incontinence, microscopic hematuria, irregular menstrual bleeding and suprapubic tenderness. UA today positive only for RBCs. >25 minutes spent in face to face time with patient, >50% spent in counselling or coordination of care Explained to Elizabeth Fleming that the differential diagnosis remains wide and we need to work this up further.  After our discussion, we agreed to proceed with KUB today as initial work up, if neg, CT of abd/pelvis without contrast to rule out kidney stones and other pathology. If all unremarkable, refer to urology for work up/cystoscopy. Also may need another GYN work up although reassuring pelvic US less than a year ago. The patient indicates understanding of these issues and agrees with the plan.

## 2014-08-05 NOTE — Patient Instructions (Signed)
Good to see you. I will call you with your xray results and someone will call you to schedule your CT.

## 2014-08-06 ENCOUNTER — Encounter: Payer: Self-pay | Admitting: Family Medicine

## 2014-08-06 LAB — URINE CULTURE: Colony Count: 70000

## 2014-08-12 ENCOUNTER — Ambulatory Visit: Payer: Self-pay | Admitting: Family Medicine

## 2014-08-13 ENCOUNTER — Encounter: Payer: Self-pay | Admitting: Family Medicine

## 2014-08-15 ENCOUNTER — Other Ambulatory Visit: Payer: Self-pay | Admitting: Family Medicine

## 2014-08-15 ENCOUNTER — Encounter: Payer: Self-pay | Admitting: Family Medicine

## 2014-08-15 DIAGNOSIS — R319 Hematuria, unspecified: Secondary | ICD-10-CM

## 2014-08-15 DIAGNOSIS — N200 Calculus of kidney: Secondary | ICD-10-CM

## 2014-09-12 ENCOUNTER — Ambulatory Visit
Admit: 2014-09-12 | Disposition: A | Discharge status: Home / Self Care | Payer: Self-pay | Attending: Family Medicine | Admitting: Family Medicine

## 2014-09-15 ENCOUNTER — Encounter: Payer: Self-pay | Admitting: Family Medicine

## 2014-09-17 ENCOUNTER — Ambulatory Visit
Admit: 2014-09-17 | Disposition: A | Discharge status: Home / Self Care | Payer: Self-pay | Attending: Urology | Admitting: Urology

## 2014-09-25 ENCOUNTER — Ambulatory Visit
Admit: 2014-09-25 | Disposition: A | Discharge status: Home / Self Care | Payer: Self-pay | Attending: Unknown Physician Specialty | Admitting: Unknown Physician Specialty

## 2014-09-25 LAB — BASIC METABOLIC PANEL
ANION GAP: 6 — AB (ref 7–16)
BUN: 12 mg/dL
CALCIUM: 8.8 mg/dL — AB
Chloride: 105 mmol/L
Co2: 27 mmol/L
Creatinine: 0.81 mg/dL
GLUCOSE: 86 mg/dL
Potassium: 4.1 mmol/L
SODIUM: 138 mmol/L

## 2014-09-25 LAB — CBC
HCT: 38.8 % (ref 35.0–47.0)
HGB: 13.1 g/dL (ref 12.0–16.0)
MCH: 28.6 pg (ref 26.0–34.0)
MCHC: 33.6 g/dL (ref 32.0–36.0)
MCV: 85 fL (ref 80–100)
PLATELETS: 258 10*3/uL (ref 150–440)
RBC: 4.56 10*6/uL (ref 3.80–5.20)
RDW: 13.3 % (ref 11.5–14.5)
WBC: 9.7 10*3/uL (ref 3.6–11.0)

## 2014-09-29 ENCOUNTER — Ambulatory Visit
Admit: 2014-09-29 | Disposition: A | Discharge status: Home / Self Care | Payer: Self-pay | Attending: Urology | Admitting: Urology

## 2014-10-01 ENCOUNTER — Encounter: Payer: Self-pay | Admitting: Family Medicine

## 2014-10-03 NOTE — Progress Notes (Signed)
Formatting of this note is different from the original.      Chief Complaint:  Chief Complaint   Patient presents with   ? Establish Care   ? Pre-op Exam   ? Abnormal ECG     per pre op ekg abnormal    Date of Service: 09/26/2014  Date of Birth: 1966-05-26  PCP: Justice RocherErin Taylor, OT   History of Present Illness: Ms. Anna Miller is a 49 y.o.female patient who presents to be established in our office.  She has a history of diabetes and hypertension.  She had an abnormal electrocardiogram done recently which revealed sinus rhythm with nonspecific ST T wave changes.  Does not have significant arrhythmia or chest pain.  He is active without too much difficulty.  Denies rapid or irregular heartbeat.  Is on atenolol for blood pressure control.  Is tolerating this well.    Past Medical and Surgical History   Past Medical History  Past Medical History   Diagnosis Date   ? Hypertension    ? Diabetes mellitus type 2, uncomplicated    ? Depression    ? Anxiety      Past Surgical History  She has past surgical history that includes Colonoscopy (04/05/2010); Cholecystectomy open; and foot surgery.     Medications and Allergies   Current Medications    Current Outpatient Prescriptions   Medication Sig Dispense Refill   ? atenolol (TENORMIN) 25 MG tablet Take by mouth.     ? buPROPion (WELLBUTRIN XL) 150 MG XL tablet Take by mouth.     ? cetirizine (ZYRTEC) 10 MG tablet Take by mouth.     ? metFORMIN (GLUCOPHAGE) 500 MG tablet TAKE 1 TABLET BY MOUTH 2 TIMES DAILY WITH A MEAL.     ? OMEGA-3/DHA/EPA/FISH OIL (FISH OIL-OMEGA-3 FATTY ACIDS) 300-1,000 mg capsule Take by mouth.       No current facility-administered medications for this visit.     Allergies: Iodine    Social and Family History   Social History   reports that she has quit smoking. She does not have any smokeless tobacco history on file. She reports that she drinks alcohol.    Family History  Family History   Problem Relation Age of Onset   ? Adopted: Yes     Review of Systems    Review of Systems   Constitutional: Negative for fever, chills, weight loss, malaise/fatigue and diaphoresis.   HENT: Negative for congestion, ear discharge, hearing loss and tinnitus.    Eyes: Negative for blurred vision.   Respiratory: Negative for cough, hemoptysis, sputum production, shortness of breath and wheezing.    Cardiovascular: Negative for chest pain, palpitations, orthopnea, claudication, leg swelling and PND.   Gastrointestinal: Negative for heartburn, nausea, vomiting, abdominal pain, diarrhea, constipation, blood in stool and melena.   Genitourinary: Negative for dysuria, urgency, frequency and hematuria.   Musculoskeletal: Negative for myalgias, back pain, joint pain and falls.   Skin: Negative for itching and rash.   Neurological: Negative for dizziness, tingling, focal weakness, loss of consciousness, weakness and headaches.   Endo/Heme/Allergies: Negative for polydipsia. Does not bruise/bleed easily.   Psychiatric/Behavioral: Negative for depression, memory loss and substance abuse. The patient is not nervous/anxious.        Physical Examination     Vitals:BP 130/80 mmHg  Pulse 79  Resp 10  Ht 165.1 cm (5\' 5" )  Wt 110.678 kg (244 lb)  BMI 40.60 kg/m2  LMP 09/22/2014 (Exact Date)  Ht:165.1 cm (5\' 5" )  Wt:110.678 kg (244 lb) ZOX:WRUE surface area is 2.25 meters squared.  Body mass index is 40.6 kg/(m^2).    Wt Readings from Last 3 Encounters:   09/26/14 110.678 kg (244 lb)     BP Readings from Last 3 Encounters:   09/26/14 130/80     General appearance   appears  in no acute distress    Head Mouth and Eye exam   Normocephalic, without obvious abnormality, atraumatic   Dentition is good   Eyes appear anicteric      Neck exam   Thyroid: normal     Nodes: no obvious adenopathy    LUNGS   Breath Sounds: Normal   Percussion: Normal    CARDIOVASCULAR   JVP    CV wave: no    HJR: no    Elevation at 90 degrees: None   Carotid    Pulse: normal pulsation bilaterally    Bruit:  None    Apex: apical  impulse normal     Auscultation    Rhythm: normal sinus rhythm    S1: normal    S2: normal    Clicks: no    Rub: no    Murmurs: no murmurs     Gallop: None  ABDOMEN   Liver enlargement: no   Pulsatile aorta: no   Ascites: no   Bruits: no    EXTREMITIES   Clubbing: no   Edema: trace to 1+ bilateral pedal edema   Pulses: peripheral pulses symmetrical   Femoral Bruits: no   Amputation: no  SKIN   Rash: no   Cyanosis: no   Embolic phemonenon:  no   Bruising:  no  NEURO   Alert and Oriented to person, place and time:  yes   Non focal:  yes    PSYCH:  Pt appears to have normal affect    Diagnostic Studies Reviewed:    EKG  EKG demonstrated normal sinus rhythm, nonspecific ST and T waves changes.    Assessment and Plan     49 y.o. female with     ICD-10-CM ICD-9-CM    1. Essential hypertension-currently stable.  Blood pressure in good control.  Diabetes being treated with metformin low risk for procedure from cardiac standpoint. I10 401.9    2. Familial multiple lipoprotein-type hyperlipidemia E78.4 272.4      Return if symptoms worsen or fail to improve.    These notes generated with voice recognition software.  I apologize for typographical errors.    Denton Ar, MD      Electronically signed by Denton Ar, MD at 10/03/2014  2:29 PM EDT

## 2014-10-05 NOTE — Op Note (Addendum)
PATIENT NAME:  Elizabeth Fleming, Elizabeth Fleming MR#:  [id number] DATE OF BIRTH:  June 26, 1965  DATE OF PROCEDURE:  09/29/2014.  PREOPERATIVE DIAGNOSIS:  Hematuria, gross.   POSTOPERATIVE DIAGNOSES:  Hematuria, gross; cystitis cystica; and normal ureters.   PROCEDURE:  Cystoscopy, bilateral retrogrades.   ANESTHESIA:  General.   DESCRIPTION OF PROCEDURE:  The patient is sterilely prepped and draped in the supine lithotomy position.  We do an appropriate timeout.  I then view the bladder with a cystoscopic 30-degree lens.  The bladder is completely viewed.  There is no tumors, masses, or growths seen.  There are small areas of cystitis cystica persistent on the floor of the bladder.  They do not appear to be carcinoma.  The ureters are in the normal position in the trigone.  The ureters are instrumented using a 5-French ureteral catheter which is open ended,  contrast is placed through this catheter and ureters appear normal throughout their length.  There is excretion of the urine and good peristalsis, although it is a little slower than normal.  I think this may have been due to do a previous infection.  There is no obstruction, masses, growths, tortuosity, or changes in the ureter to indicate obstruction or mass.  The kidneys themselves show sharp outlines of all of the calyces.  So, at the end of the procedure, the patient's bladder is  emptied and 30 mL of Marcaine placed in the bladder and is sent to recovery in satisfactory condition.    ____________________________ Janice Coffin. Elnoria Howard, DO rdh:kc D: 09/29/2014 16:10:06 ET T: 09/29/2014 23:00:38 ET JOB#: [id number]  cc: Janice Coffin. Elnoria Howard, DO, <Dictator> RICHARD D HART DO ELECTRONICALLY SIGNED 10/27/2014 15:14

## 2014-10-13 ENCOUNTER — Other Ambulatory Visit: Payer: Self-pay | Admitting: Family Medicine

## 2014-12-22 ENCOUNTER — Other Ambulatory Visit: Payer: Self-pay | Admitting: Family Medicine

## 2015-01-07 ENCOUNTER — Encounter: Payer: Self-pay | Admitting: Family Medicine

## 2015-01-07 ENCOUNTER — Other Ambulatory Visit: Payer: Self-pay | Admitting: Family Medicine

## 2015-01-07 MED ORDER — BUPROPION HCL ER (XL) 150 MG PO TB24: 150.0000 mg | ORAL_TABLET | Freq: Every day | ORAL | Status: DC

## 2015-01-28 ENCOUNTER — Ambulatory Visit (INDEPENDENT_AMBULATORY_CARE_PROVIDER_SITE_OTHER): Payer: 59 | Admitting: Family Medicine

## 2015-01-28 ENCOUNTER — Encounter: Payer: Self-pay | Admitting: Family Medicine

## 2015-01-28 VITALS — BP 118/70 | HR 71 | Temp 98.2°F | Wt 251.0 lb

## 2015-01-28 DIAGNOSIS — R1011 Right upper quadrant pain: Secondary | ICD-10-CM | POA: Diagnosis not present

## 2015-01-28 DIAGNOSIS — N926 Irregular menstruation, unspecified: Secondary | ICD-10-CM

## 2015-01-28 DIAGNOSIS — R739 Hyperglycemia, unspecified: Secondary | ICD-10-CM | POA: Diagnosis not present

## 2015-01-28 LAB — CBC WITH DIFFERENTIAL/PLATELET
BASOS PCT: 0.7 % (ref 0.0–3.0)
Basophils Absolute: 0.1 10*3/uL (ref 0.0–0.1)
Eosinophils Absolute: 0.4 10*3/uL (ref 0.0–0.7)
Eosinophils Relative: 4.6 % (ref 0.0–5.0)
HCT: 38.3 % (ref 36.0–46.0)
Hemoglobin: 12.7 g/dL (ref 12.0–15.0)
LYMPHS ABS: 1.8 10*3/uL (ref 0.7–4.0)
Lymphocytes Relative: 22.7 % (ref 12.0–46.0)
MCHC: 33.2 g/dL (ref 30.0–36.0)
MCV: 87 fl (ref 78.0–100.0)
Monocytes Absolute: 0.5 10*3/uL (ref 0.1–1.0)
Monocytes Relative: 6.1 % (ref 3.0–12.0)
NEUTROS PCT: 65.9 % (ref 43.0–77.0)
Neutro Abs: 5.3 10*3/uL (ref 1.4–7.7)
Platelets: 246 10*3/uL (ref 150.0–400.0)
RBC: 4.4 Mil/uL (ref 3.87–5.11)
RDW: 13.7 % (ref 11.5–15.5)
WBC: 8.1 10*3/uL (ref 4.0–10.5)

## 2015-01-28 LAB — COMPREHENSIVE METABOLIC PANEL
ALK PHOS: 87 U/L (ref 39–117)
ALT: 18 U/L (ref 0–35)
AST: 19 U/L (ref 0–37)
Albumin: 3.8 g/dL (ref 3.5–5.2)
BUN: 9 mg/dL (ref 6–23)
CO2: 30 mEq/L (ref 19–32)
Calcium: 9.4 mg/dL (ref 8.4–10.5)
Chloride: 102 mEq/L (ref 96–112)
Creatinine, Ser: 0.75 mg/dL (ref 0.40–1.20)
GFR: 87.84 mL/min (ref >60.00)
GLUCOSE: 100 mg/dL — AB (ref 70–99)
POTASSIUM: 4.5 meq/L (ref 3.5–5.1)
SODIUM: 137 meq/L (ref 135–145)
TOTAL PROTEIN: 6.8 g/dL (ref 6.0–8.3)
Total Bilirubin: 0.7 mg/dL (ref 0.2–1.2)

## 2015-01-28 LAB — LIPASE: Lipase: 26 U/L (ref 11.0–59.0)

## 2015-01-28 LAB — HEMOGLOBIN A1C: Hgb A1c MFr Bld: 5.3 % (ref 4.6–6.5)

## 2015-01-28 NOTE — Progress Notes (Signed)
Subjective:   Patient ID: Elizabeth Fleming, female    DOB: Jun 21, 1965, 49 y.o.   MRN: [id number]  Elizabeth Fleming is a pleasant 49 y.o. year old female who presents to clinic today with Abdominal Pain  on 01/28/2015  HPI:  Right upper quadrant pain- 2 months of intermittent RUQ pain. Pain typically a 4/10 and only lasts minutes (no more than 10 minutes) at a time.  Feels like her gallbladder biliary colic used to feel like prior to her cholecystectomy over 25 years ago. Pain does seem to be aggravated by food. Time makes it better.  Has not tried anything else for it.  No associated nausea, vomiting, fevers, changes in bowel habits or black or bloody stool.  Did run out of her metformin one month ago.  Needs this refilled and a1c checked today.  Being followed by GYN for menorrhagia.  Lab Results  Component Value Date   HGBA1C 5.3 06/17/2014   Current Outpatient Prescriptions on File Prior to Visit  Medication Sig Dispense Refill  . atenolol (TENORMIN) 25 MG tablet TAKE 1 TABLET (25 MG TOTAL) BY MOUTH DAILY. 90 tablet 1  . buPROPion (WELLBUTRIN XL) 150 MG 24 hr tablet Take 1 tablet (150 mg total) by mouth daily. 90 tablet 1  . cetirizine (ZYRTEC) 10 MG tablet Take 10 mg by mouth daily.      . fish oil-omega-3 fatty acids 1000 MG capsule Take 1 g by mouth daily.      . metFORMIN (GLUCOPHAGE) 500 MG tablet TAKE 1 TABLET BY MOUTH 2 TIMES DAILY WITH A MEAL. 180 tablet 0   No current facility-administered medications on file prior to visit.    Allergies  Allergen Reactions  . Iodine     Past Medical History  Diagnosis Date  . Hypertension   . Depression   . Migraines   . Allergy   . Colon polyps 2012  . Genital warts     Past Surgical History  Procedure Laterality Date  . Cholecystectomy    . Tonsillectomy  1974    Family History  Problem Relation Age of Onset  . Adopted: Yes    Social History   Social History  . Marital Status: Married    Spouse Name:  Bari Edward  . Number of Children: 2  . Years of Education: 14   Occupational History  . [employer]   Social History Main Topics  . Smoking status: Former Smoker    Quit date: 08/27/2006  . Smokeless tobacco: Never Used  . Alcohol Use: No  . Drug Use: No  . Sexual Activity: Yes   Other Topics Concern  . Not on file   Social History Narrative   The PMH, PSH, Social History, Family History, Medications, and allergies have been reviewed in [facility], and have been updated if relevant.   Review of Systems  Constitutional: Negative.   HENT: Negative.   Respiratory: Negative.   Cardiovascular: Negative.   Gastrointestinal: Positive for abdominal pain and abdominal distention. Negative for nausea, vomiting, diarrhea, constipation, blood in stool, anal bleeding and rectal pain.  Genitourinary: Positive for menstrual problem.  Musculoskeletal: Negative.   Skin: Negative.   Allergic/Immunologic: Negative.   Neurological: Negative.   Hematological: Negative.   Psychiatric/Behavioral: Negative.   All other systems reviewed and are negative.      Objective:    BP 118/70 mmHg  Pulse 71  Temp(Src) 98.2 F (36.8 C) (Tympanic)  Wt 251 lb (113.853 kg)  SpO2 99%   Physical Exam  Constitutional: She is oriented to person, place, and time. She appears well-developed and well-nourished. No distress.  HENT:  Head: Normocephalic.  Eyes: Conjunctivae are normal.  Neck: Normal range of motion.  Cardiovascular: Normal rate.   Pulmonary/Chest: Effort normal.  Abdominal: Soft. Bowel sounds are normal. She exhibits no distension and no mass. There is no tenderness. There is no rebound and no guarding.  Musculoskeletal: She exhibits no edema.  Neurological: She is alert and oriented to person, place, and time. No cranial nerve deficit.  Skin: Skin is warm and dry.  Psychiatric: She has a normal mood and affect. Her behavior is normal. Judgment and thought  content normal.  Nursing note and vitals reviewed.         Assessment & Plan:   Hyperglycemia  Abdominal pain, right upper quadrant No Follow-up on file.

## 2015-01-28 NOTE — Patient Instructions (Addendum)
Great to see you. We will call you with your lab results.  Please stop by to see Rosaria Ferries or Vaughan Basta on your way out.

## 2015-01-28 NOTE — Progress Notes (Signed)
Pre visit review using our clinic review tool, if applicable. No additional management support is needed unless otherwise documented below in the visit note. 

## 2015-01-28 NOTE — Assessment & Plan Note (Signed)
Followed by GYN Check CBC today to monitor H/H.

## 2015-01-28 NOTE — Assessment & Plan Note (Signed)
New- exam reassuring.  No tenderness. Does seem like biliary colic in terms of her nature of pain- abd [place]- ? ERCP if pain persist and imaging and labs unremarkable. Orders Placed This Encounter  Procedures  . US Abdomen Limited RUQ  . Lipase  . Hemoglobin A1c  . Comprehensive metabolic panel  . CBC with Differential/Platelet   The patient indicates understanding of these issues and agrees with the plan.

## 2015-01-30 ENCOUNTER — Encounter: Payer: Self-pay | Admitting: Family Medicine

## 2015-02-03 ENCOUNTER — Ambulatory Visit: Payer: 59

## 2015-02-12 ENCOUNTER — Ambulatory Visit
Admission: RE | Admit: 2015-02-12 | Discharge: 2015-02-12 | Disposition: A | Discharge status: Home / Self Care | Payer: 59 | Source: Ambulatory Visit | Attending: Family Medicine | Admitting: Family Medicine

## 2015-02-12 ENCOUNTER — Encounter: Payer: Self-pay | Admitting: Family Medicine

## 2015-02-12 DIAGNOSIS — R1011 Right upper quadrant pain: Secondary | ICD-10-CM | POA: Insufficient documentation

## 2015-02-19 MED ORDER — METFORMIN HCL 500 MG PO TABS: 500.0000 mg | ORAL_TABLET | Freq: Two times a day (BID) | ORAL | Status: DC

## 2015-02-19 NOTE — Telephone Encounter (Signed)
rx sent to pharmacy by e-script  

## 2015-04-11 ENCOUNTER — Encounter: Payer: Self-pay | Admitting: Family Medicine

## 2015-04-13 ENCOUNTER — Other Ambulatory Visit: Payer: Self-pay | Admitting: Family Medicine

## 2015-04-13 MED ORDER — SCOPOLAMINE 1 MG/3DAYS TD PT72: 1.0000 | MEDICATED_PATCH | TRANSDERMAL | Status: DC

## 2015-05-27 ENCOUNTER — Other Ambulatory Visit: Payer: Self-pay | Admitting: Family Medicine

## 2015-06-05 ENCOUNTER — Other Ambulatory Visit: Payer: Self-pay

## 2015-06-05 MED ORDER — ATENOLOL 25 MG PO TABS: ORAL_TABLET | ORAL | Status: DC

## 2015-06-05 NOTE — Telephone Encounter (Signed)
Pt is out of atenolol; pt last f/u 06/2014. Sent # 30 x 0 to [place]; pt will cb to schedule f/u appt before med is out.

## 2015-06-09 ENCOUNTER — Encounter: Payer: Self-pay | Admitting: Internal Medicine

## 2015-06-27 ENCOUNTER — Encounter: Payer: Self-pay | Admitting: Gastroenterology

## 2015-07-07 ENCOUNTER — Encounter: Payer: Self-pay | Admitting: Physician Assistant

## 2015-07-07 ENCOUNTER — Ambulatory Visit: Payer: Self-pay | Admitting: Physician Assistant

## 2015-07-07 VITALS — BP 140/90 | HR 64 | Temp 98.5°F

## 2015-07-07 DIAGNOSIS — I10 Essential (primary) hypertension: Secondary | ICD-10-CM

## 2015-07-07 MED ORDER — ATENOLOL 25 MG PO TABS: ORAL_TABLET | ORAL | Status: DC

## 2015-07-07 NOTE — Progress Notes (Signed)
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

## 2015-07-08 ENCOUNTER — Other Ambulatory Visit: Payer: Self-pay | Admitting: Family Medicine

## 2015-07-10 ENCOUNTER — Other Ambulatory Visit: Payer: Self-pay | Admitting: Family Medicine

## 2015-07-10 DIAGNOSIS — E785 Hyperlipidemia, unspecified: Secondary | ICD-10-CM

## 2015-07-10 DIAGNOSIS — I10 Essential (primary) hypertension: Secondary | ICD-10-CM

## 2015-07-13 ENCOUNTER — Encounter: Payer: Self-pay | Admitting: Family Medicine

## 2015-07-15 ENCOUNTER — Encounter: Payer: Self-pay | Admitting: Family Medicine

## 2015-07-20 ENCOUNTER — Other Ambulatory Visit (INDEPENDENT_AMBULATORY_CARE_PROVIDER_SITE_OTHER): Payer: BLUE CROSS/BLUE SHIELD

## 2015-07-20 DIAGNOSIS — I10 Essential (primary) hypertension: Secondary | ICD-10-CM

## 2015-07-20 DIAGNOSIS — Z0000 Encounter for general adult medical examination without abnormal findings: Secondary | ICD-10-CM

## 2015-07-20 DIAGNOSIS — E785 Hyperlipidemia, unspecified: Secondary | ICD-10-CM | POA: Diagnosis not present

## 2015-07-20 LAB — COMPREHENSIVE METABOLIC PANEL
ALK PHOS: 97 U/L (ref 39–117)
ALT: 22 U/L (ref 0–35)
AST: 22 U/L (ref 0–37)
Albumin: 3.9 g/dL (ref 3.5–5.2)
BILIRUBIN TOTAL: 0.6 mg/dL (ref 0.2–1.2)
BUN: 8 mg/dL (ref 6–23)
CALCIUM: 9.1 mg/dL (ref 8.4–10.5)
CO2: 29 mEq/L (ref 19–32)
Chloride: 102 mEq/L (ref 96–112)
Creatinine, Ser: 0.75 mg/dL (ref 0.40–1.20)
GFR: 87.66 mL/min (ref >60.00)
GLUCOSE: 98 mg/dL (ref 70–99)
POTASSIUM: 4.4 meq/L (ref 3.5–5.1)
Sodium: 137 mEq/L (ref 135–145)
TOTAL PROTEIN: 7 g/dL (ref 6.0–8.3)

## 2015-07-20 LAB — CBC WITH DIFFERENTIAL/PLATELET
BASOS ABS: 0 10*3/uL (ref 0.0–0.1)
Basophils Relative: 0.5 % (ref 0.0–3.0)
EOS PCT: 4.5 % (ref 0.0–5.0)
Eosinophils Absolute: 0.4 10*3/uL (ref 0.0–0.7)
HEMATOCRIT: 39.7 % (ref 36.0–46.0)
Hemoglobin: 13 g/dL (ref 12.0–15.0)
LYMPHS ABS: 1.4 10*3/uL (ref 0.7–4.0)
LYMPHS PCT: 15.5 % (ref 12.0–46.0)
MCHC: 32.7 g/dL (ref 30.0–36.0)
MCV: 85.3 fl (ref 78.0–100.0)
MONOS PCT: 7.5 % (ref 3.0–12.0)
Monocytes Absolute: 0.7 10*3/uL (ref 0.1–1.0)
NEUTROS ABS: 6.5 10*3/uL (ref 1.4–7.7)
NEUTROS PCT: 72 % (ref 43.0–77.0)
PLATELETS: 220 10*3/uL (ref 150.0–400.0)
RBC: 4.66 Mil/uL (ref 3.87–5.11)
RDW: 14 % (ref 11.5–15.5)
WBC: 9 10*3/uL (ref 4.0–10.5)

## 2015-07-20 LAB — LIPID PANEL
Cholesterol: 209 mg/dL — ABNORMAL HIGH (ref 0–200)
HDL: 43.3 mg/dL (ref >39.00)
NonHDL: 165.81
TRIGLYCERIDES: 251 mg/dL — AB (ref 0.0–149.0)
Total CHOL/HDL Ratio: 5
VLDL: 50.2 mg/dL — AB (ref 0.0–40.0)

## 2015-07-20 LAB — TSH: TSH: 2.94 u[IU]/mL (ref 0.35–4.50)

## 2015-07-20 LAB — LDL CHOLESTEROL, DIRECT: Direct LDL: 144 mg/dL

## 2015-07-21 LAB — NICOTINE/COTININE METABOLITES: Cotinine: <10 ng/mL

## 2015-07-22 ENCOUNTER — Encounter: Payer: Self-pay | Admitting: Family Medicine

## 2015-07-22 ENCOUNTER — Ambulatory Visit (INDEPENDENT_AMBULATORY_CARE_PROVIDER_SITE_OTHER): Payer: BLUE CROSS/BLUE SHIELD | Admitting: Family Medicine

## 2015-07-22 VITALS — BP 120/80 | HR 75 | Temp 98.1°F | Ht 65.0 in | Wt 257.0 lb

## 2015-07-22 DIAGNOSIS — I10 Essential (primary) hypertension: Secondary | ICD-10-CM

## 2015-07-22 DIAGNOSIS — F4325 Adjustment disorder with mixed disturbance of emotions and conduct: Secondary | ICD-10-CM

## 2015-07-22 DIAGNOSIS — E669 Obesity, unspecified: Secondary | ICD-10-CM

## 2015-07-22 DIAGNOSIS — Z01419 Encounter for gynecological examination (general) (routine) without abnormal findings: Secondary | ICD-10-CM | POA: Insufficient documentation

## 2015-07-22 DIAGNOSIS — Z0000 Encounter for general adult medical examination without abnormal findings: Secondary | ICD-10-CM

## 2015-07-22 DIAGNOSIS — E785 Hyperlipidemia, unspecified: Secondary | ICD-10-CM

## 2015-07-22 NOTE — Assessment & Plan Note (Signed)
LDL stable, TG elevated but she admits to eating more breads and carbs.  She is motivated to cut back.

## 2015-07-22 NOTE — Progress Notes (Signed)
Pre visit review using our clinic review tool, if applicable. No additional management support is needed unless otherwise documented below in the visit note. 

## 2015-07-22 NOTE — Patient Instructions (Signed)
Great to see you. Congratulations on renewing your marriage vows.  Please consider swimming more!  Call [facility] to set up your mammogram.

## 2015-07-22 NOTE — Assessment & Plan Note (Signed)
Reviewed preventive care protocols, scheduled due services, and updated immunizations Discussed nutrition, exercise, diet, and healthy lifestyle.  

## 2015-07-22 NOTE — Progress Notes (Signed)
Subjective:   Patient ID: Elizabeth Fleming, female    DOB: 1965/07/15, 50 y.o.   MRN: [id number]  Keisi Lurlie Mooty is a pleasant 50 y.o. year old female who presents to clinic today with Annual Exam  and follow up of chronic medical conditions on 07/22/2015  HPI:  Pap smear 08/12/13  Mammogram 09/15/14  Has gained weight- admits to eating more carbs/breads sweets.  Has a pool but has not been swimming.    HTN- currently taking atenolol 25 mg daily.  Denies HA, blurred vision, CP or SOB. No LE edema. Lab Results  Component Value Date   CREATININE 0.75 07/20/2015   Hyperglycemia/prediabetes- has been controlled with low dose Metformin.  Depression- feels symptoms have been controlled on current dose of Wellbutrin.  Lab Results  Component Value Date   CHOL 209* 07/20/2015   HDL 43.30 07/20/2015   LDLCALC 147* 08/12/2013   LDLDIRECT 144.0 07/20/2015   TRIG 251.0* 07/20/2015   CHOLHDL 5 07/20/2015   Lab Results  Component Value Date   CREATININE 0.75 07/20/2015   Lab Results  Component Value Date   TSH 2.94 07/20/2015   Lab Results  Component Value Date   WBC 9.0 07/20/2015   HGB 13.0 07/20/2015   HCT 39.7 07/20/2015   MCV 85.3 07/20/2015   PLT 220.0 07/20/2015   Current Outpatient Prescriptions on File Prior to Visit  Medication Sig Dispense Refill  . atenolol (TENORMIN) 25 MG tablet TAKE 1 TABLET (25 MG TOTAL) BY MOUTH DAILY. 30 tablet 3  . buPROPion (WELLBUTRIN XL) 150 MG 24 hr tablet Take 1 tablet (150 mg total) by mouth daily. OFFICE VISIT REQUIRED FOR ADDITIONAL REFILLS 30 tablet 0  . cetirizine (ZYRTEC) 10 MG tablet Take 10 mg by mouth daily.      . fish oil-omega-3 fatty acids 1000 MG capsule Take 1 g by mouth daily.      . metFORMIN (GLUCOPHAGE) 500 MG tablet Take 1 tablet (500 mg total) by mouth 2 (two) times daily with a meal. 180 tablet 3   No current facility-administered medications on file prior to visit.    Allergies  Allergen Reactions  .  Iodine     Past Medical History  Diagnosis Date  . Hypertension   . Depression   . Migraines   . Allergy   . Colon polyps 2012  . Genital warts     Past Surgical History  Procedure Laterality Date  . Cholecystectomy    . Tonsillectomy  1974    Family History  Problem Relation Age of Onset  . Adopted: Yes    Social History   Social History  . Marital Status: Married    Spouse Name: Bari Edward  . Number of Children: 2  . Years of Education: 14   Occupational History  . [place]   Social History Main Topics  . Smoking status: Former Smoker    Quit date: 08/27/2006  . Smokeless tobacco: Never Used  . Alcohol Use: No  . Drug Use: No  . Sexual Activity: Yes   Other Topics Concern  . Not on file   Social History Narrative   The PMH, PSH, Social History, Family History, Medications, and allergies have been reviewed in [facility], and have been updated if relevant.   Review of Systems  Constitutional: Negative.   HENT: Negative.   Eyes: Negative.   Respiratory: Negative.   Cardiovascular: Negative.   Gastrointestinal: Negative.  Endocrine: Negative.   Genitourinary: Negative.   Musculoskeletal: Negative.   Skin: Negative.   Allergic/Immunologic: Negative.   Neurological: Negative.   Hematological: Negative.   Psychiatric/Behavioral: Negative.   All other systems reviewed and are negative.      Objective:    BP 120/80 mmHg  Pulse 75  Temp(Src) 98.1 F (36.7 C) (Tympanic)  Ht 5\' 5"  (1.651 m)  Wt 257 lb (116.574 kg)  BMI 42.77 kg/m2  SpO2 97%  LMP 06/29/2015 (Approximate)  Wt Readings from Last 3 Encounters:  07/22/15 257 lb (116.574 kg)  01/28/15 251 lb (113.853 kg)  08/05/14 241 lb 4 oz (109.43 kg)     Physical Exam  General:  Well-developed,well-nourished,in no acute distress; alert,appropriate and cooperative throughout examination Head:  normocephalic and atraumatic.   Eyes:  vision grossly intact,  pupils equal, pupils round, and pupils reactive to light.   Ears:  R ear normal and L ear normal.   Nose:  no external deformity.   Mouth:  good dentition.   Neck:  No deformities, masses, or tenderness noted. Breasts:  No mass, nodules, thickening, tenderness, bulging, retraction, inflamation, nipple discharge or skin changes noted.   Lungs:  Normal respiratory effort, chest expands symmetrically. Lungs are clear to auscultation, no crackles or wheezes. Heart:  Normal rate and regular rhythm. S1 and S2 normal without gallop, murmur, click, rub or other extra sounds. Abdomen:  Bowel sounds positive,abdomen soft and non-tender without masses, organomegaly or hernias noted. Msk:  No deformity or scoliosis noted of thoracic or lumbar spine.   Extremities:  No clubbing, cyanosis, edema, or deformity noted with normal full range of motion of all joints.   Neurologic:  alert & oriented X3 and gait normal.   Skin:  Intact without suspicious lesions or rashes Cervical Nodes:  No lymphadenopathy noted Axillary Nodes:  No palpable lymphadenopathy Psych:  Cognition and judgment appear intact. Alert and cooperative with normal attention span and concentration. No apparent delusions, illusions, hallucinations      Assessment & Plan:   Well woman exam  HLD (hyperlipidemia)  Essential hypertension  Adjustment disorder with mixed disturbance of emotions and conduct No Follow-up on file.

## 2015-07-22 NOTE — Assessment & Plan Note (Signed)
Well controlled on current rx. No changes made today. 

## 2015-07-22 NOTE — Assessment & Plan Note (Signed)
Deteriorated. Discussed logging calories, swimming.  She is motivated to make these changes.

## 2015-07-23 NOTE — Telephone Encounter (Signed)
Form completed and faxed back to requested party 

## 2015-07-28 ENCOUNTER — Encounter: Payer: Self-pay | Admitting: Family Medicine

## 2015-08-05 ENCOUNTER — Encounter: Payer: Self-pay | Admitting: Family Medicine

## 2015-08-06 ENCOUNTER — Other Ambulatory Visit: Payer: Self-pay | Admitting: Family Medicine

## 2015-08-14 ENCOUNTER — Other Ambulatory Visit: Payer: Self-pay | Admitting: Family Medicine

## 2015-08-14 ENCOUNTER — Encounter: Payer: Self-pay | Admitting: Family Medicine

## 2015-08-14 MED ORDER — ALBUTEROL SULFATE HFA 108 (90 BASE) MCG/ACT IN AERS: 2.0000 | INHALATION_SPRAY | Freq: Four times a day (QID) | RESPIRATORY_TRACT | Status: DC | PRN

## 2015-08-26 ENCOUNTER — Encounter: Payer: Self-pay | Admitting: Family Medicine

## 2015-08-26 NOTE — Telephone Encounter (Signed)
Spoke to pt and resent form

## 2015-09-11 ENCOUNTER — Encounter: Payer: Self-pay | Admitting: Family Medicine

## 2015-09-14 ENCOUNTER — Ambulatory Visit: Payer: Self-pay | Admitting: Physician Assistant

## 2015-09-14 ENCOUNTER — Encounter: Payer: Self-pay | Admitting: Physician Assistant

## 2015-09-14 VITALS — BP 120/70 | HR 73 | Temp 97.9°F

## 2015-09-14 DIAGNOSIS — M436 Torticollis: Secondary | ICD-10-CM

## 2015-09-14 MED ORDER — METHYLPREDNISOLONE 4 MG PO TBPK: ORAL_TABLET | ORAL | Status: AC

## 2015-09-14 MED ORDER — CYCLOBENZAPRINE HCL 10 MG PO TABS: 10.0000 mg | ORAL_TABLET | Freq: Three times a day (TID) | ORAL | Status: DC | PRN

## 2015-09-14 NOTE — Progress Notes (Signed)
S: c/o neck pain/spasms; no fever/chills/body aches; no known injury, states pain is worse at night, increased with movement, denies numbness/tingling, states she is using a tens unit with a little relief, tried nsaids/ocycodone without relief  O: vitals wnl, nad, cspine nontender, +spams in trapezious b/l, decreased rom of neck , grips = b/l, n/v intact  A: torticollis  P: medrol dose pack, flexeril, wet heat followed by ice, if cannot tolerate flexeril due to sedation will call in either baclofen or robaxin

## 2015-09-16 ENCOUNTER — Encounter: Payer: Self-pay | Admitting: Family Medicine

## 2015-09-16 NOTE — Telephone Encounter (Signed)
See message below from pt

## 2015-09-17 NOTE — Telephone Encounter (Signed)
Hi Dr. Deborra Medina, Honestly I did not realize it was in my basket.  My apologies and I will get to it today.   Thank you!

## 2015-09-25 ENCOUNTER — Encounter: Payer: Self-pay | Admitting: Family Medicine

## 2015-11-04 ENCOUNTER — Other Ambulatory Visit: Payer: Self-pay | Admitting: Physician Assistant

## 2015-11-05 ENCOUNTER — Encounter: Payer: Self-pay | Admitting: Family Medicine

## 2015-11-06 MED ORDER — ATENOLOL 25 MG PO TABS: ORAL_TABLET | ORAL | Status: DC

## 2015-11-06 NOTE — Telephone Encounter (Signed)
Per Dr Deborra Medina, "Ok to refill rx as requested below."

## 2015-11-06 NOTE — Telephone Encounter (Signed)
Rx prescribed by alternate provider. pls advise

## 2016-02-29 ENCOUNTER — Other Ambulatory Visit: Payer: Self-pay | Admitting: Family Medicine

## 2016-04-20 ENCOUNTER — Ambulatory Visit: Payer: Self-pay | Admitting: Physician Assistant

## 2016-04-20 ENCOUNTER — Encounter: Payer: Self-pay | Admitting: Physician Assistant

## 2016-04-20 VITALS — BP 150/90 | HR 82 | Temp 98.2°F

## 2016-04-20 DIAGNOSIS — R300 Dysuria: Secondary | ICD-10-CM

## 2016-04-20 LAB — POCT URINALYSIS DIPSTICK
BILIRUBIN UA: NEGATIVE
Glucose, UA: NEGATIVE
KETONES UA: NEGATIVE
Nitrite, UA: NEGATIVE
PH UA: 6.5
PROTEIN UA: NEGATIVE
SPEC GRAV UA: 1.02
Urobilinogen, UA: 0.2

## 2016-04-20 MED ORDER — CIPROFLOXACIN HCL 250 MG PO TABS: 250.0000 mg | ORAL_TABLET | Freq: Two times a day (BID) | ORAL | 0 refills | Status: DC

## 2016-04-20 NOTE — Progress Notes (Signed)
Pt left urine with nurse due to uti sx, had to return to work, ua +leuks, cipro sent to pharmacy

## 2016-05-17 ENCOUNTER — Ambulatory Visit (INDEPENDENT_AMBULATORY_CARE_PROVIDER_SITE_OTHER): Payer: BLUE CROSS/BLUE SHIELD | Admitting: Family Medicine

## 2016-05-17 ENCOUNTER — Encounter: Payer: Self-pay | Admitting: Family Medicine

## 2016-05-17 VITALS — BP 124/76 | HR 71 | Temp 98.1°F | Wt 245.2 lb

## 2016-05-17 DIAGNOSIS — R7303 Prediabetes: Secondary | ICD-10-CM | POA: Insufficient documentation

## 2016-05-17 DIAGNOSIS — E782 Mixed hyperlipidemia: Secondary | ICD-10-CM

## 2016-05-17 DIAGNOSIS — I10 Essential (primary) hypertension: Secondary | ICD-10-CM

## 2016-05-17 DIAGNOSIS — F4320 Adjustment disorder, unspecified: Secondary | ICD-10-CM

## 2016-05-17 LAB — COMPREHENSIVE METABOLIC PANEL
ALBUMIN: 3.8 g/dL (ref 3.5–5.2)
ALT: 15 U/L (ref 0–35)
AST: 16 U/L (ref 0–37)
Alkaline Phosphatase: 77 U/L (ref 39–117)
BUN: 16 mg/dL (ref 6–23)
CALCIUM: 9.3 mg/dL (ref 8.4–10.5)
CO2: 29 mEq/L (ref 19–32)
Chloride: 101 mEq/L (ref 96–112)
Creatinine, Ser: 0.81 mg/dL (ref 0.40–1.20)
GFR: 79.93 mL/min (ref >60.00)
Glucose, Bld: 99 mg/dL (ref 70–99)
POTASSIUM: 4.3 meq/L (ref 3.5–5.1)
SODIUM: 137 meq/L (ref 135–145)
Total Bilirubin: 0.7 mg/dL (ref 0.2–1.2)
Total Protein: 6.9 g/dL (ref 6.0–8.3)

## 2016-05-17 LAB — LIPID PANEL
CHOL/HDL RATIO: 4
CHOLESTEROL: 203 mg/dL — AB (ref 0–200)
HDL: 47.1 mg/dL (ref >39.00)
NonHDL: 156.28
TRIGLYCERIDES: 217 mg/dL — AB (ref 0.0–149.0)
VLDL: 43.4 mg/dL — AB (ref 0.0–40.0)

## 2016-05-17 LAB — HEMOGLOBIN A1C: Hgb A1c MFr Bld: 5.3 % (ref 4.6–6.5)

## 2016-05-17 LAB — LDL CHOLESTEROL, DIRECT: Direct LDL: 135 mg/dL

## 2016-05-17 MED ORDER — ATENOLOL 25 MG PO TABS: ORAL_TABLET | ORAL | 2 refills | Status: DC

## 2016-05-17 MED ORDER — METFORMIN HCL 500 MG PO TABS: ORAL_TABLET | ORAL | 2 refills | Status: DC

## 2016-05-17 NOTE — Assessment & Plan Note (Signed)
Check a1c. Continue current dose of Metformin.

## 2016-05-17 NOTE — Patient Instructions (Signed)
Great to see you. Happy Holidays!  We will call you with your results.

## 2016-05-17 NOTE — Assessment & Plan Note (Signed)
Due for labs today. 

## 2016-05-17 NOTE — Progress Notes (Signed)
Subjective:   Patient ID: Elizabeth Fleming, female    DOB: September 11, 1965, 50 y.o.   MRN: [id number]  Elizabeth Fleming is a pleasant 50 y.o. year old female who presents to clinic today with Follow-up and Medication Refill  on 05/17/2016  HPI:  HTN- well controlled on atenolol 25 mg daily. Denies HA, blurred vision, CP or SOB. Lab Results  Component Value Date   CREATININE 0.75 07/20/2015    Prediabetes- has been controlled with Metformin 500 mg twice daily. Lab Results  Component Value Date   HGBA1C 5.3 01/28/2015   Due for a1c.  Depression- she denies any symptoms of anxiety or depression on current dose of Wellbutrin.  Current Outpatient Prescriptions on File Prior to Visit  Medication Sig Dispense Refill  . albuterol (PROVENTIL HFA;VENTOLIN HFA) 108 (90 Base) MCG/ACT inhaler Inhale 2 puffs into the lungs every 6 (six) hours as needed. 1 Inhaler 0  . buPROPion (WELLBUTRIN XL) 150 MG 24 hr tablet Take 1 tablet (150 mg total) by mouth daily. 90 tablet 3  . cetirizine (ZYRTEC) 10 MG tablet Take 10 mg by mouth daily.      . fish oil-omega-3 fatty acids 1000 MG capsule Take 1 g by mouth daily.       No current facility-administered medications on file prior to visit.     Allergies  Allergen Reactions  . Iodine     Past Medical History:  Diagnosis Date  . Allergy   . Colon polyps 2012  . Depression   . Genital warts   . Hypertension   . Migraines     Past Surgical History:  Procedure Laterality Date  . CHOLECYSTECTOMY    . TONSILLECTOMY  1974    Family History  Problem Relation Age of Onset  . Adopted: Yes    Social History   Social History  . Marital status: Married    Spouse name: Bari Edward  . Number of children: 2  . Years of education: 14   Occupational History  . [place]   Social History Main Topics  . Smoking status: Former Smoker    Quit date: 08/27/2006  . Smokeless tobacco: Never Used    . Alcohol use No  . Drug use: No  . Sexual activity: Yes   Other Topics Concern  . Not on file   Social History Narrative  . No narrative on file   The PMH, PSH, Social History, Family History, Medications, and allergies have been reviewed in [facility], and have been updated if relevant.   Review of Systems  Constitutional: Negative.   HENT: Negative.   Eyes: Negative.   Respiratory: Negative.   Cardiovascular: Negative.   Gastrointestinal: Negative.   Endocrine: Negative.   Genitourinary: Negative.   Musculoskeletal: Negative.   Skin: Negative.   Allergic/Immunologic: Negative.   Neurological: Negative.   Hematological: Negative.   Psychiatric/Behavioral: Negative.   All other systems reviewed and are negative.      Objective:    BP 124/76   Pulse 71   Temp 98.1 F (36.7 C) (Oral)   Wt 245 lb 4 oz (111.2 kg)   LMP 04/18/2016   SpO2 99%   BMI 40.81 kg/m    Physical Exam  Constitutional: She is oriented to person, place, and time. She appears well-developed and well-nourished. No distress.  HENT:  Head: Normocephalic and atraumatic.  Eyes: Conjunctivae are normal.  Cardiovascular: Normal rate and regular rhythm.  Pulmonary/Chest: Effort normal and breath sounds normal.  Musculoskeletal: Normal range of motion. She exhibits no edema.  Neurological: She is alert and oriented to person, place, and time. No cranial nerve deficit.  Skin: Skin is warm and dry. She is not diaphoretic.  Psychiatric: She has a normal mood and affect. Her behavior is normal. Judgment and thought content normal.  Nursing note and vitals reviewed.         Assessment & Plan:   Essential hypertension  Adjustment disorder, unspecified type  Mixed hyperlipidemia - Plan: Comprehensive metabolic panel, Lipid panel  Pre-diabetes - Plan: Hemoglobin A1c No Follow-up on file.

## 2016-05-17 NOTE — Assessment & Plan Note (Signed)
Doing well on current dose of Wellbutrin.  No changes made to rxs today.

## 2016-05-17 NOTE — Assessment & Plan Note (Signed)
Well controlled. No changes made to current rxs.  

## 2016-06-24 ENCOUNTER — Encounter: Payer: Self-pay | Admitting: Family Medicine

## 2016-08-01 ENCOUNTER — Other Ambulatory Visit: Payer: Self-pay | Admitting: Family Medicine

## 2016-08-01 DIAGNOSIS — Z1231 Encounter for screening mammogram for malignant neoplasm of breast: Secondary | ICD-10-CM

## 2016-08-15 ENCOUNTER — Other Ambulatory Visit: Payer: Self-pay | Admitting: Family Medicine

## 2016-08-22 ENCOUNTER — Encounter: Payer: Self-pay | Admitting: Podiatry

## 2016-08-22 ENCOUNTER — Ambulatory Visit (INDEPENDENT_AMBULATORY_CARE_PROVIDER_SITE_OTHER): Payer: BLUE CROSS/BLUE SHIELD

## 2016-08-22 ENCOUNTER — Ambulatory Visit (INDEPENDENT_AMBULATORY_CARE_PROVIDER_SITE_OTHER): Payer: BLUE CROSS/BLUE SHIELD | Admitting: Podiatry

## 2016-08-22 VITALS — BP 148/94 | HR 90

## 2016-08-22 DIAGNOSIS — M722 Plantar fascial fibromatosis: Secondary | ICD-10-CM

## 2016-08-22 DIAGNOSIS — M2041 Other hammer toe(s) (acquired), right foot: Secondary | ICD-10-CM | POA: Diagnosis not present

## 2016-08-22 DIAGNOSIS — M79671 Pain in right foot: Secondary | ICD-10-CM | POA: Diagnosis not present

## 2016-08-22 DIAGNOSIS — M2140 Flat foot [pes planus] (acquired), unspecified foot: Secondary | ICD-10-CM

## 2016-08-22 DIAGNOSIS — R52 Pain, unspecified: Secondary | ICD-10-CM

## 2016-08-22 DIAGNOSIS — B351 Tinea unguium: Secondary | ICD-10-CM

## 2016-08-29 ENCOUNTER — Ambulatory Visit: Payer: BLUE CROSS/BLUE SHIELD | Attending: Family Medicine

## 2016-08-29 NOTE — Progress Notes (Signed)
Subjective: 51 year old female presents the office today for concerns of right foot pain as well as left big toenail pathology. She states that her right foot she appears that he had a neuroma excised and subsequent developed a hammertoe for which she underwent a hammertoe repair as well as metatarsal osteotomy. Since then she's had discomfort in the ball the foot that she also states that when she walks her big toe rolls. Also her second toe does not purchase the ground. She states that she has pain with walking. She is tried over-the-counter inserts with some relief. She denies any recent injury or trauma. She also states that she had a left big toenail partially removed and however small piece of nail has grown back. Denies any pain redness or drainage or any swelling. Denies any systemic complaints such as fevers, chills, nausea, vomiting. No acute changes since last appointment, and no other complaints at this time.   Objective: AAO x3, NAD; appears to be agitated today in regards to her feet. DP/PT pulses palpable bilaterally, CRT less than 3 seconds Scars are well-healed on the right foot and the prior surgery. Upon weightbearing this decrease in medial arch height. Her second toe does not purchase the ground. There is prominent metatarsal heads plantarly with atrophy of the fat pad. There is trace edema there is no erythema or increase in warmth. There is no palpable neuroma. No area pinpoint tenderness or pain the vibratory sensation. Mild discomfort on the medial arch the plantar fascial within the arch of the foot. Left foot on the medial nail border small piece of nail which is regrowing on the nail border. After debridement there is no drainage or pus or bleeding during the debridement. The nail does appear to be mildly hypertrophic and dystrophic. There is yellow to brown discoloration of the nail. No swelling, redness, drainage, or signs of infection. No other areas of tenderness identified  bilaterally. No open lesions or pre-ulcerative lesions.  No pain with calf compression, swelling, warmth, erythema  Assessment: Right foot pain likely biomechanical in nature as well as digital deformities; onychodystrophy left hallux  Plan: -All treatment options discussed with the patient including all alternatives, risks, complications.  -X-rays were obtained and reviewed. We along discussion regards to treatment options the patient. She was stop any further surgical intervention as she is not had outcomes to her satisfaction I do not think adding a third surgery will help. I will to try a custom molded orthotic. I had her evaluated today by Liliane Channel for inserts. They were sent to [facility]. -I debrided the piece of nail from the medial left hallux without any complications or bleeding. Nail sent for culture to [facility].  -RTC in 3 weeks or sooner if needed. Call any questions or concerns. -Patient encouraged to call the office with any questions, concerns, change in symptoms.   Celesta Gentile, DPM

## 2016-09-13 ENCOUNTER — Telehealth: Payer: Self-pay | Admitting: *Deleted

## 2016-09-13 DIAGNOSIS — Z79899 Other long term (current) drug therapy: Secondary | ICD-10-CM

## 2016-09-13 NOTE — Telephone Encounter (Signed)
Pt called for fungal culture results. I informed pt of Dr. Leigh Aurora review of fungal culture results as + and if would like to begin Lamisil therapy would need Hepatic Function and CBC with Diff. Pt states understanding and will have labs drawn at the hospital.

## 2016-09-16 ENCOUNTER — Other Ambulatory Visit: Admission: RE | Admit: 2016-09-16 | Payer: BLUE CROSS/BLUE SHIELD | Source: Ambulatory Visit | Admitting: Podiatry

## 2016-09-22 ENCOUNTER — Ambulatory Visit: Payer: Self-pay | Admitting: Physician Assistant

## 2016-09-22 ENCOUNTER — Encounter: Payer: Self-pay | Admitting: Physician Assistant

## 2016-09-22 VITALS — BP 121/80 | HR 74 | Temp 97.9°F

## 2016-09-22 DIAGNOSIS — R202 Paresthesia of skin: Secondary | ICD-10-CM

## 2016-09-22 NOTE — Progress Notes (Signed)
S: c/o having prickly sensation all over her body, started with her ear and moved to the trunk, now affecting her legs too, no new foods/detergents, etc, no rash with the sensation, does take wellbutrin  O: vitals wnl, nad, lungs c t a, cv rrr, no rash noted in any areas where the sensation occurs, n/v intact  A: paresthesias  P: labs for b12/liver enzymes, if neg consider skipping a day of wellbutrin to see if the sx calm down, f/u with pcp

## 2016-09-23 ENCOUNTER — Other Ambulatory Visit
Admission: RE | Admit: 2016-09-23 | Discharge: 2016-09-23 | Disposition: A | Discharge status: Home / Self Care | Payer: BLUE CROSS/BLUE SHIELD | Source: Ambulatory Visit | Attending: Podiatry | Admitting: Podiatry

## 2016-09-23 ENCOUNTER — Other Ambulatory Visit
Admission: RE | Admit: 2016-09-23 | Discharge: 2016-09-23 | Disposition: A | Discharge status: Home / Self Care | Payer: BLUE CROSS/BLUE SHIELD | Source: Ambulatory Visit | Attending: Urology | Admitting: Urology

## 2016-09-23 DIAGNOSIS — R252 Cramp and spasm: Secondary | ICD-10-CM | POA: Insufficient documentation

## 2016-09-23 DIAGNOSIS — R229 Localized swelling, mass and lump, unspecified: Secondary | ICD-10-CM | POA: Diagnosis not present

## 2016-09-23 LAB — CBC WITH DIFFERENTIAL/PLATELET
Basophils Absolute: 0.1 10*3/uL (ref 0–0.1)
Basophils Relative: 1 %
EOS ABS: 0.5 10*3/uL (ref 0–0.7)
EOS PCT: 6 %
HCT: 39.6 % (ref 35.0–47.0)
HEMOGLOBIN: 13.7 g/dL (ref 12.0–16.0)
LYMPHS ABS: 1.9 10*3/uL (ref 1.0–3.6)
LYMPHS PCT: 21 %
MCH: 29.5 pg (ref 26.0–34.0)
MCHC: 34.5 g/dL (ref 32.0–36.0)
MCV: 85.6 fL (ref 80.0–100.0)
MONOS PCT: 7 %
Monocytes Absolute: 0.7 10*3/uL (ref 0.2–0.9)
NEUTROS PCT: 65 %
Neutro Abs: 5.9 10*3/uL (ref 1.4–6.5)
Platelets: 234 10*3/uL (ref 150–440)
RBC: 4.63 MIL/uL (ref 3.80–5.20)
RDW: 13.8 % (ref 11.5–14.5)
WBC: 9 10*3/uL (ref 3.6–11.0)

## 2016-09-23 LAB — HEPATIC FUNCTION PANEL
ALK PHOS: 95 U/L (ref 38–126)
ALT: 25 U/L (ref 14–54)
AST: 30 U/L (ref 15–41)
Albumin: 3.8 g/dL (ref 3.5–5.0)
BILIRUBIN TOTAL: 1 mg/dL (ref 0.3–1.2)
Bilirubin, Direct: <0.1 mg/dL — ABNORMAL LOW (ref 0.1–0.5)
TOTAL PROTEIN: 7.5 g/dL (ref 6.5–8.1)

## 2016-09-23 LAB — FOLATE: Folate: 31 ng/mL (ref >5.9)

## 2016-09-23 LAB — VITAMIN B12: VITAMIN B 12: 417 pg/mL (ref 180–914)

## 2016-09-26 MED ORDER — TERBINAFINE HCL 250 MG PO TABS: 250.0000 mg | ORAL_TABLET | Freq: Every day | ORAL | 0 refills | Status: DC

## 2016-09-26 NOTE — Telephone Encounter (Signed)
-----   Message from Trula Slade, DPM sent at 09/25/2016  5:11 PM EDT ----- Ok to start lamisil. Please let her know. Thanks.

## 2016-10-04 ENCOUNTER — Ambulatory Visit
Admission: RE | Admit: 2016-10-04 | Discharge: 2016-10-04 | Disposition: A | Discharge status: Home / Self Care | Payer: BLUE CROSS/BLUE SHIELD | Source: Ambulatory Visit | Attending: Family Medicine | Admitting: Family Medicine

## 2016-10-04 DIAGNOSIS — Z1231 Encounter for screening mammogram for malignant neoplasm of breast: Secondary | ICD-10-CM | POA: Insufficient documentation

## 2016-10-24 ENCOUNTER — Telehealth: Payer: Self-pay

## 2016-10-24 NOTE — Telephone Encounter (Signed)
Late Entry:  Pt called the other day.  She is a Development worker, community at [employer].  Please call.  LM that day and again 10/24/16 at 1:50pm

## 2016-11-07 NOTE — Telephone Encounter (Signed)
Called pt today/  Apologized for not getting back with her sooner.  She said to disregard msg - it was probably needing info on pt's delivery but she didn't write down the pt's name.

## 2017-01-06 ENCOUNTER — Encounter: Payer: Self-pay | Admitting: Physician Assistant

## 2017-01-06 ENCOUNTER — Ambulatory Visit: Payer: Self-pay | Admitting: Physician Assistant

## 2017-01-06 VITALS — BP 130/84 | HR 81 | Temp 98.3°F

## 2017-01-06 DIAGNOSIS — H1032 Unspecified acute conjunctivitis, left eye: Secondary | ICD-10-CM

## 2017-01-06 MED ORDER — TOBRAMYCIN 0.3 % OP SOLN: 2.0000 [drp] | OPHTHALMIC | 0 refills | Status: DC

## 2017-01-06 NOTE — Progress Notes (Signed)
S:  C/o left eye being irritated, crusty, sx started last pm, denies, cough, congestion, fever, chills, v/d; remainder ros neg  O: vitals wnl, nad, perrl eomi, left eye with injected conjunctiva, no drainage or matting noted at this time, tms clear, nasal mucosa inflamed, throat wnl, neck supple no lymph, lungs c t a, cv rrr  A:  Acute conjunctivitis  P: tobramycin opth gtts, return if not better in 3 - 5d, if worsening return earlier or see eye doctor, no work until eye is no longer red or draining

## 2017-01-26 ENCOUNTER — Ambulatory Visit (INDEPENDENT_AMBULATORY_CARE_PROVIDER_SITE_OTHER): Payer: BLUE CROSS/BLUE SHIELD | Admitting: Family Medicine

## 2017-01-26 ENCOUNTER — Encounter: Payer: Self-pay | Admitting: Family Medicine

## 2017-01-26 DIAGNOSIS — H6983 Other specified disorders of Eustachian tube, bilateral: Secondary | ICD-10-CM | POA: Insufficient documentation

## 2017-01-26 DIAGNOSIS — H6981 Other specified disorders of Eustachian tube, right ear: Secondary | ICD-10-CM | POA: Insufficient documentation

## 2017-01-26 MED ORDER — CETIRIZINE HCL 10 MG PO TABS: 10.0000 mg | ORAL_TABLET | Freq: Every day | ORAL | 3 refills | Status: AC

## 2017-01-26 MED ORDER — FLUTICASONE PROPIONATE 50 MCG/ACT NA SUSP: 2.0000 | Freq: Every day | NASAL | 6 refills | Status: DC

## 2017-01-26 MED ORDER — BUPROPION HCL ER (XL) 150 MG PO TB24: 150.0000 mg | ORAL_TABLET | Freq: Every day | ORAL | 3 refills | Status: DC

## 2017-01-26 MED ORDER — ATENOLOL 25 MG PO TABS: ORAL_TABLET | ORAL | 3 refills | Status: DC

## 2017-01-26 NOTE — Progress Notes (Signed)
Subjective:   Patient ID: Elizabeth Fleming, female    DOB: 1966/03/04, 51 y.o.   MRN: [id number]  Elizabeth Fleming is a pleasant 51 y.o. year old female who presents to clinic today with Ear Problem  on 01/26/2017  HPI:  Feels both of her ears are full of fluid- popping a lot, feels pressure, left? Right. Has had some nasal congestion.  Taking zyrtec daily.   Current Outpatient Prescriptions on File Prior to Visit  Medication Sig Dispense Refill  . albuterol (PROVENTIL HFA;VENTOLIN HFA) 108 (90 Base) MCG/ACT inhaler Inhale 2 puffs into the lungs every 6 (six) hours as needed. 1 Inhaler 0  . fish oil-omega-3 fatty acids 1000 MG capsule Take 1 g by mouth daily.      . metFORMIN (GLUCOPHAGE) 500 MG tablet TAKE 1 TABLET BY MOUTH TWICE DAILY WITH A MEAL. 180 tablet 2   No current facility-administered medications on file prior to visit.     Allergies  Allergen Reactions  . Iodine     Past Medical History:  Diagnosis Date  . Allergy   . Colon polyps 2012  . Depression   . Genital warts   . Hypertension   . Migraines     Past Surgical History:  Procedure Laterality Date  . CHOLECYSTECTOMY    . TONSILLECTOMY  1974    Family History  Problem Relation Age of Onset  . Adopted: Yes  . Breast cancer Neg Hx     Social History   Social History  . Marital status: Married    Spouse name: Bari Edward  . Number of children: 2  . Years of education: 14   Occupational History  . [place]   Social History Main Topics  . Smoking status: Former Smoker    Quit date: 08/27/2006  . Smokeless tobacco: Never Used  . Alcohol use No  . Drug use: No  . Sexual activity: Yes   Other Topics Concern  . Not on file   Social History Narrative  . No narrative on file   The PMH, PSH, Social History, Family History, Medications, and allergies have been reviewed in [facility], and have been updated if relevant.   Review of Systems    Constitutional: Negative.   HENT: Positive for congestion, ear pain and postnasal drip. Negative for dental problem, drooling, ear discharge, facial swelling, hearing loss, mouth sores, nosebleeds, rhinorrhea, sinus pain, sinus pressure, sneezing, sore throat, tinnitus, trouble swallowing and voice change.   Respiratory: Negative.   Cardiovascular: Negative.   Gastrointestinal: Negative.   Musculoskeletal: Negative.   Neurological: Negative.   Hematological: Negative.   Psychiatric/Behavioral: Negative.   All other systems reviewed and are negative.      Objective:    BP 118/78   Pulse 78   Temp 98.1 F (36.7 C) (Oral)   Resp 16   Ht 5\' 5"  (1.651 m)   Wt 259 lb 6.4 oz (117.7 kg)   SpO2 98%   BMI 43.17 kg/m    Physical Exam  Constitutional: She is oriented to person, place, and time. She appears well-developed and well-nourished. No distress.  HENT:  Head: Normocephalic and atraumatic.  Right Ear: Hearing and tympanic membrane normal.  Left Ear: Hearing normal. Tympanic membrane is retracted.  Eyes: Conjunctivae are normal.  Cardiovascular: Normal rate.   Pulmonary/Chest: Effort normal.  Musculoskeletal: Normal range of motion.  Neurological: She is alert and oriented to person, place, and  time. No cranial nerve deficit.  Skin: Skin is warm and dry. She is not diaphoretic.  Psychiatric: She has a normal mood and affect. Her behavior is normal. Judgment and thought content normal.  Nursing note and vitals reviewed.         Assessment & Plan:   ETD (Eustachian tube dysfunction), bilateral No Follow-up on file.

## 2017-01-26 NOTE — Patient Instructions (Signed)
Great to see you. Let's try Flonase for a few weeks. Please keep me updated.

## 2017-01-26 NOTE — Assessment & Plan Note (Signed)
She cannot tolerate decongestants- causes palpitations. Advised continued antihistamine, add flonase- eRx sent. Call or return to clinic prn if these symptoms worsen or fail to improve as anticipated. The patient indicates understanding of these issues and agrees with the plan.

## 2017-03-01 ENCOUNTER — Other Ambulatory Visit: Payer: Self-pay | Admitting: Family Medicine

## 2017-04-14 DIAGNOSIS — L905 Scar conditions and fibrosis of skin: Secondary | ICD-10-CM | POA: Diagnosis not present

## 2017-04-14 DIAGNOSIS — L814 Other melanin hyperpigmentation: Secondary | ICD-10-CM | POA: Diagnosis not present

## 2017-04-14 DIAGNOSIS — L821 Other seborrheic keratosis: Secondary | ICD-10-CM | POA: Diagnosis not present

## 2017-04-14 DIAGNOSIS — D485 Neoplasm of uncertain behavior of skin: Secondary | ICD-10-CM | POA: Diagnosis not present

## 2017-04-14 DIAGNOSIS — D1809 Hemangioma of other sites: Secondary | ICD-10-CM | POA: Diagnosis not present

## 2017-04-14 DIAGNOSIS — D1801 Hemangioma of skin and subcutaneous tissue: Secondary | ICD-10-CM | POA: Diagnosis not present

## 2017-04-14 DIAGNOSIS — D229 Melanocytic nevi, unspecified: Secondary | ICD-10-CM | POA: Diagnosis not present

## 2017-05-17 ENCOUNTER — Other Ambulatory Visit: Payer: Self-pay | Admitting: Family Medicine

## 2017-08-17 ENCOUNTER — Telehealth: Payer: Self-pay | Admitting: Family Medicine

## 2017-08-17 NOTE — Telephone Encounter (Signed)
Copied from [place] [id number]. Topic: Quick Communication - See Telephone Encounter >> Aug 17, 2017  4:08 PM Robina Ade, Helene Kelp D wrote: CRM for notification. See Telephone encounter for: 08/17/17. Patient called and and schedule her CPE on 09/13/17 and would like her labs done before her appt for Dr. Deborra Medina to have it available. She would like the labs order to be sent to [place]. If there are any questions please call patient, thanks.

## 2017-08-23 ENCOUNTER — Other Ambulatory Visit: Payer: Self-pay | Admitting: Family Medicine

## 2017-08-23 DIAGNOSIS — Z1231 Encounter for screening mammogram for malignant neoplasm of breast: Secondary | ICD-10-CM

## 2017-09-11 ENCOUNTER — Encounter: Payer: Self-pay | Admitting: Family Medicine

## 2017-09-13 ENCOUNTER — Ambulatory Visit (INDEPENDENT_AMBULATORY_CARE_PROVIDER_SITE_OTHER): Payer: BLUE CROSS/BLUE SHIELD | Admitting: Family Medicine

## 2017-09-13 ENCOUNTER — Telehealth: Payer: Self-pay | Admitting: Gastroenterology

## 2017-09-13 ENCOUNTER — Other Ambulatory Visit: Payer: Self-pay

## 2017-09-13 ENCOUNTER — Other Ambulatory Visit (HOSPITAL_COMMUNITY)
Admission: RE | Admit: 2017-09-13 | Discharge: 2017-09-13 | Disposition: A | Discharge status: Home / Self Care | Payer: BLUE CROSS/BLUE SHIELD | Source: Ambulatory Visit | Attending: Family Medicine | Admitting: Family Medicine

## 2017-09-13 ENCOUNTER — Encounter: Payer: Self-pay | Admitting: Family Medicine

## 2017-09-13 VITALS — BP 126/74 | HR 82 | Temp 97.8°F | Ht 63.78 in | Wt 250.4 lb

## 2017-09-13 DIAGNOSIS — Z23 Encounter for immunization: Secondary | ICD-10-CM

## 2017-09-13 DIAGNOSIS — Z1211 Encounter for screening for malignant neoplasm of colon: Secondary | ICD-10-CM

## 2017-09-13 DIAGNOSIS — I10 Essential (primary) hypertension: Secondary | ICD-10-CM | POA: Diagnosis not present

## 2017-09-13 DIAGNOSIS — R7303 Prediabetes: Secondary | ICD-10-CM

## 2017-09-13 DIAGNOSIS — Z0000 Encounter for general adult medical examination without abnormal findings: Secondary | ICD-10-CM | POA: Diagnosis not present

## 2017-09-13 DIAGNOSIS — Z01419 Encounter for gynecological examination (general) (routine) without abnormal findings: Secondary | ICD-10-CM | POA: Insufficient documentation

## 2017-09-13 DIAGNOSIS — R079 Chest pain, unspecified: Secondary | ICD-10-CM

## 2017-09-13 DIAGNOSIS — E782 Mixed hyperlipidemia: Secondary | ICD-10-CM | POA: Diagnosis not present

## 2017-09-13 DIAGNOSIS — R0789 Other chest pain: Secondary | ICD-10-CM | POA: Insufficient documentation

## 2017-09-13 LAB — HEMOGLOBIN A1C: HEMOGLOBIN A1C: 5.6 % (ref 4.6–6.5)

## 2017-09-13 LAB — COMPREHENSIVE METABOLIC PANEL
ALBUMIN: 4.2 g/dL (ref 3.5–5.2)
ALK PHOS: 94 U/L (ref 39–117)
ALT: 27 U/L (ref 0–35)
AST: 23 U/L (ref 0–37)
BILIRUBIN TOTAL: 1.2 mg/dL (ref 0.2–1.2)
BUN: 13 mg/dL (ref 6–23)
CALCIUM: 9.6 mg/dL (ref 8.4–10.5)
CHLORIDE: 101 meq/L (ref 96–112)
CO2: 29 mEq/L (ref 19–32)
Creatinine, Ser: 0.79 mg/dL (ref 0.40–1.20)
GFR: 81.83 mL/min (ref >60.00)
Glucose, Bld: 103 mg/dL — ABNORMAL HIGH (ref 70–99)
Potassium: 4.6 mEq/L (ref 3.5–5.1)
Sodium: 139 mEq/L (ref 135–145)
TOTAL PROTEIN: 7.2 g/dL (ref 6.0–8.3)

## 2017-09-13 LAB — LIPID PANEL
CHOLESTEROL: 237 mg/dL — AB (ref 0–200)
HDL: 47.1 mg/dL (ref >39.00)
NonHDL: 190.25
TRIGLYCERIDES: 234 mg/dL — AB (ref 0.0–149.0)
Total CHOL/HDL Ratio: 5
VLDL: 46.8 mg/dL — ABNORMAL HIGH (ref 0.0–40.0)

## 2017-09-13 LAB — TSH: TSH: 1.73 u[IU]/mL (ref 0.35–4.50)

## 2017-09-13 LAB — LDL CHOLESTEROL, DIRECT: LDL DIRECT: 172 mg/dL

## 2017-09-13 NOTE — Assessment & Plan Note (Signed)
No changes made. Labs today.

## 2017-09-13 NOTE — Patient Instructions (Addendum)
Great to see you. I will call you with your lab results from today and you can view them online.   We will call you with your GI appointment to discuss colonoscopy as well.  Please call the breast center to schedule your mammogram.

## 2017-09-13 NOTE — Assessment & Plan Note (Signed)
Continue current rx Check a1c today. 

## 2017-09-13 NOTE — Telephone Encounter (Signed)
Pt left vm to set up colonoscopy

## 2017-09-13 NOTE — Assessment & Plan Note (Signed)
Reviewed preventive care protocols, scheduled due services, and updated immunizations Discussed nutrition, exercise, diet, and healthy lifestyle.  Pap smear today. 

## 2017-09-13 NOTE — Assessment & Plan Note (Signed)
EKG reassuring- NSR. Advised to let me know if symptoms return. Refer to cardiology for stress testing. The patient indicates understanding of these issues and agrees with the plan.

## 2017-09-13 NOTE — Assessment & Plan Note (Signed)
Normotensive 

## 2017-09-13 NOTE — Progress Notes (Signed)
Subjective:   Patient ID: Elizabeth Fleming, female    DOB: 1965-07-15, 52 y.o.   MRN: [id number]  Elizabeth Fleming is a pleasant 52 y.o. year old female who presents to clinic today with Annual Exam (Patient is here today for a CPE with . She is currently fasting today.  She agrees to get the Tdap.  Agrees to GI referral at [facility] works at [employer].)  and follow up of chronic medical conditions on 09/13/2017  HPI:  Health Maintenance  Topic Date Due  . HIV Screening  07/20/1982  . INFLUENZA VACCINE  01/04/2018  . MAMMOGRAM  10/05/2018  . COLONOSCOPY  08/18/2020  . PAP SMEAR  09/13/2020  . TETANUS/TDAP  09/14/2027    Pap smear 08/12/13  Mammogram 10/04/16   HTN- currently taking atenolol 25 mg daily.  Denies HA, blurred vision, CP or SOB. No LE edema. Lab Results  Component Value Date   CREATININE 0.81 05/17/2016   Hyperglycemia/prediabetes- has been controlled with low dose Metformin.  Has noticed some intermittent chest pain and palpitations.  Sometimes while walking but often at rest.  Her new job requires more sitting and she has noticed more LE edema as well.  Depression- feels symptoms have been controlled on current dose of Wellbutrin.  Lab Results  Component Value Date   CHOL 203 (H) 05/17/2016   HDL 47.10 05/17/2016   LDLCALC 147 (H) 08/12/2013   LDLDIRECT 135.0 05/17/2016   TRIG 217.0 (H) 05/17/2016   CHOLHDL 4 05/17/2016   Lab Results  Component Value Date   CREATININE 0.81 05/17/2016   Lab Results  Component Value Date   TSH 2.94 07/20/2015   Lab Results  Component Value Date   WBC 9.0 09/23/2016   HGB 13.7 09/23/2016   HCT 39.6 09/23/2016   MCV 85.6 09/23/2016   PLT 234 09/23/2016   Current Outpatient Medications on File Prior to Visit  Medication Sig Dispense Refill  . albuterol (PROVENTIL HFA;VENTOLIN HFA) 108 (90 Base) MCG/ACT inhaler Inhale 2 puffs into the lungs every 6 (six) hours as needed. 1 Inhaler 0  . atenolol (TENORMIN) 25 MG  tablet TAKE 1 TABLET (25 MG TOTAL) BY MOUTH DAILY. 90 tablet 3  . buPROPion (WELLBUTRIN XL) 150 MG 24 hr tablet Take 1 tablet (150 mg total) by mouth daily. 90 tablet 3  . cetirizine (ZYRTEC) 10 MG tablet Take 1 tablet (10 mg total) by mouth daily. 90 tablet 3  . fish oil-omega-3 fatty acids 1000 MG capsule Take 1 g by mouth daily.      . fluticasone (FLONASE) 50 MCG/ACT nasal spray Place 2 sprays into both nostrils daily. 16 g 6  . metFORMIN (GLUCOPHAGE) 500 MG tablet TAKE 1 TABLET BY MOUTH TWICE DAILY WITH A MEAL. 180 tablet 2   No current facility-administered medications on file prior to visit.     Allergies  Allergen Reactions  . Iodine     Past Medical History:  Diagnosis Date  . Allergy   . Colon polyps 2012  . Depression   . Genital warts   . Hypertension   . Migraines     Past Surgical History:  Procedure Laterality Date  . CHOLECYSTECTOMY    . TONSILLECTOMY  1974    Family History  Adopted: Yes  Problem Relation Age of Onset  . Breast cancer Neg Hx     Social History   Socioeconomic History  . Marital status: Married    Spouse name: Bari Edward  .  Number of children: 2  . Years of education: 79  . Highest education level: Not on file  Occupational History  . Occupation: Engineer, maintenance: [employer]  . Financial resource strain: Not on file  . Food insecurity:    Worry: Not on file    Inability: Not on file  . Transportation needs:    Medical: Not on file    Non-medical: Not on file  Tobacco Use  . Smoking status: Former Smoker    Last attempt to quit: 08/27/2006    Years since quitting: 11.0  . Smokeless tobacco: Never Used  Substance and Sexual Activity  . Alcohol use: No    Alcohol/week: 0.0 oz  . Drug use: No  . Sexual activity: Yes  Lifestyle  . Physical activity:    Days per week: Not on file    Minutes per session: Not on file  . Stress: Not on file  Relationships    . Social connections:    Talks on phone: Not on file    Gets together: Not on file    Attends religious service: Not on file    Active member of club or organization: Not on file    Attends meetings of clubs or organizations: Not on file    Relationship status: Not on file  . Intimate partner violence:    Fear of current or ex partner: Not on file    Emotionally abused: Not on file    Physically abused: Not on file    Forced sexual activity: Not on file  Other Topics Concern  . Not on file  Social History Narrative  . Not on file   The PMH, PSH, Social History, Family History, Medications, and allergies have been reviewed in [facility], and have been updated if relevant.   Review of Systems  Constitutional: Negative.   HENT: Negative.   Eyes: Negative.   Respiratory: Negative.   Cardiovascular: Positive for chest pain, palpitations and leg swelling.  Gastrointestinal: Negative.   Endocrine: Negative.   Genitourinary: Negative.   Musculoskeletal: Negative.   Skin: Negative.   Allergic/Immunologic: Negative.   Neurological: Negative.   Hematological: Negative.   Psychiatric/Behavioral: Negative.   All other systems reviewed and are negative.      Objective:    BP 126/74 (BP Location: Left Arm, Patient Position: Sitting, Cuff Size: Normal)   Pulse 82   Temp 97.8 F (36.6 C) (Oral)   Ht 5' 3.78" (1.62 m)   Wt 250 lb 6.4 oz (113.6 kg)   LMP 08/16/2017   SpO2 97%   BMI 43.28 kg/m   Wt Readings from Last 3 Encounters:  09/13/17 250 lb 6.4 oz (113.6 kg)  01/26/17 259 lb 6.4 oz (117.7 kg)  05/17/16 245 lb 4 oz (111.2 kg)     Physical Exam   General:  Well-developed,well-nourished,in no acute distress; alert,appropriate and cooperative throughout examination Head:  normocephalic and atraumatic.   Eyes:  vision grossly intact, PERRL Ears:  R ear normal and L ear normal externally, TMs clear bilaterally Nose:  no external deformity.   Mouth:  good dentition.   Neck:   No deformities, masses, or tenderness noted. Breasts:  No mass, nodules, thickening, tenderness, bulging, retraction, inflamation, nipple discharge or skin changes noted.   Lungs:  Normal respiratory effort, chest expands symmetrically. Lungs are clear to auscultation, no crackles or wheezes. Heart:  Normal rate and regular rhythm.  S1 and S2 normal without gallop, murmur, click, rub or other extra sounds. Abdomen:  Bowel sounds positive,abdomen soft and non-tender without masses, organomegaly or hernias noted. Rectal:  no external abnormalities.   Genitalia:  Pelvic Exam:        External: normal female genitalia without lesions or masses        Vagina: normal without lesions or masses        Cervix: normal without lesions or masses        Adnexa: normal bimanual exam without masses or fullness        Uterus: normal by palpation        Pap smear: performed Msk:  No deformity or scoliosis noted of thoracic or lumbar spine.   Extremities:  No clubbing, cyanosis, edema, or deformity noted with normal full range of motion of all joints.   Neurologic:  alert & oriented X3 and gait normal.   Skin:  Intact without suspicious lesions or rashes Cervical Nodes:  No lymphadenopathy noted Axillary Nodes:  No palpable lymphadenopathy Psych:  Cognition and judgment appear intact. Alert and cooperative with normal attention span and concentration. No apparent delusions, illusions, hallucinations       Assessment & Plan:   Well woman exam with routine gynecological exam - Plan: Cytology - PAP  Need for Tdap vaccination - Plan: Tdap vaccine greater than or equal to 7yo IM  Pre-diabetes - Plan: Hemoglobin A1c  Mixed hyperlipidemia - Plan: Comprehensive metabolic panel, Lipid panel, TSH  Essential hypertension  Screening for colon cancer - Plan: Ambulatory referral to Gastroenterology  Chest pain, unspecified type - Plan: EKG 12-Lead No follow-ups on file.

## 2017-09-14 ENCOUNTER — Other Ambulatory Visit: Payer: Self-pay | Admitting: Family Medicine

## 2017-09-14 LAB — CYTOLOGY - PAP
BACTERIAL VAGINITIS: POSITIVE — AB
Candida vaginitis: NEGATIVE
DIAGNOSIS: NEGATIVE
HPV: NOT DETECTED

## 2017-09-14 MED ORDER — METRONIDAZOLE 500 MG PO TABS: 500.0000 mg | ORAL_TABLET | Freq: Two times a day (BID) | ORAL | 0 refills | Status: DC

## 2017-09-14 NOTE — Telephone Encounter (Signed)
Patient has been scheduled for her colonoscopy May 31st, 2019.  Thanks [employer]

## 2017-09-25 ENCOUNTER — Other Ambulatory Visit: Payer: Self-pay | Admitting: Family Medicine

## 2017-09-25 MED ORDER — ATORVASTATIN CALCIUM 20 MG PO TABS: 20.0000 mg | ORAL_TABLET | Freq: Every day | ORAL | 3 refills | Status: DC

## 2017-09-27 ENCOUNTER — Telehealth: Payer: Self-pay

## 2017-09-27 DIAGNOSIS — E782 Mixed hyperlipidemia: Secondary | ICD-10-CM

## 2017-09-27 NOTE — Telephone Encounter (Signed)
LMOVM for pt to RTC to sched lab visit for fasting Lipid & CMP June 19th or close after/PEC plz schedule this/orders placed for future/thx dmf

## 2017-10-09 ENCOUNTER — Encounter: Payer: Self-pay | Admitting: Family Medicine

## 2017-10-10 ENCOUNTER — Ambulatory Visit
Admission: RE | Admit: 2017-10-10 | Discharge: 2017-10-10 | Disposition: A | Discharge status: Home / Self Care | Payer: BLUE CROSS/BLUE SHIELD | Source: Ambulatory Visit | Attending: Family Medicine | Admitting: Family Medicine

## 2017-10-10 DIAGNOSIS — Z1231 Encounter for screening mammogram for malignant neoplasm of breast: Secondary | ICD-10-CM

## 2017-10-20 ENCOUNTER — Encounter: Payer: Self-pay | Admitting: Family Medicine

## 2017-10-23 ENCOUNTER — Other Ambulatory Visit: Payer: Self-pay

## 2017-10-23 ENCOUNTER — Encounter: Payer: Self-pay | Admitting: Family Medicine

## 2017-10-23 MED ORDER — ATORVASTATIN CALCIUM 20 MG PO TABS: 20.0000 mg | ORAL_TABLET | Freq: Every day | ORAL | 3 refills | Status: DC

## 2017-10-23 NOTE — Progress Notes (Signed)
Sent to MO per message from pt/thx dmf

## 2017-10-24 ENCOUNTER — Other Ambulatory Visit: Payer: Self-pay

## 2017-10-24 MED ORDER — METFORMIN HCL 500 MG PO TABS: ORAL_TABLET | ORAL | 3 refills | Status: DC

## 2017-10-24 MED ORDER — ATENOLOL 25 MG PO TABS: ORAL_TABLET | ORAL | 3 refills | Status: DC

## 2017-10-24 MED ORDER — BUPROPION HCL ER (XL) 150 MG PO TB24: 150.0000 mg | ORAL_TABLET | Freq: Every day | ORAL | 3 refills | Status: DC

## 2017-10-31 ENCOUNTER — Encounter: Payer: Self-pay | Admitting: Family Medicine

## 2017-11-02 ENCOUNTER — Encounter: Payer: Self-pay | Admitting: *Deleted

## 2017-11-03 ENCOUNTER — Ambulatory Visit: Payer: BLUE CROSS/BLUE SHIELD | Admitting: Certified Registered"

## 2017-11-03 ENCOUNTER — Ambulatory Visit
Admission: RE | Admit: 2017-11-03 | Discharge: 2017-11-03 | Disposition: A | Discharge status: Home / Self Care | Payer: BLUE CROSS/BLUE SHIELD | Source: Ambulatory Visit | Attending: Gastroenterology | Admitting: Gastroenterology

## 2017-11-03 ENCOUNTER — Encounter: Payer: Self-pay | Admitting: Anesthesiology

## 2017-11-03 ENCOUNTER — Encounter
Admission: RE | Disposition: A | Discharge status: Home / Self Care | Payer: Self-pay | Source: Ambulatory Visit | Attending: Gastroenterology

## 2017-11-03 DIAGNOSIS — K6389 Other specified diseases of intestine: Secondary | ICD-10-CM

## 2017-11-03 DIAGNOSIS — K219 Gastro-esophageal reflux disease without esophagitis: Secondary | ICD-10-CM | POA: Diagnosis not present

## 2017-11-03 DIAGNOSIS — Z87891 Personal history of nicotine dependence: Secondary | ICD-10-CM | POA: Diagnosis not present

## 2017-11-03 DIAGNOSIS — K6289 Other specified diseases of anus and rectum: Secondary | ICD-10-CM | POA: Diagnosis not present

## 2017-11-03 DIAGNOSIS — Z860100 Personal history of colon polyps, unspecified: Secondary | ICD-10-CM

## 2017-11-03 DIAGNOSIS — K529 Noninfective gastroenteritis and colitis, unspecified: Secondary | ICD-10-CM | POA: Insufficient documentation

## 2017-11-03 DIAGNOSIS — D125 Benign neoplasm of sigmoid colon: Secondary | ICD-10-CM

## 2017-11-03 DIAGNOSIS — K635 Polyp of colon: Secondary | ICD-10-CM | POA: Insufficient documentation

## 2017-11-03 DIAGNOSIS — Z79899 Other long term (current) drug therapy: Secondary | ICD-10-CM | POA: Insufficient documentation

## 2017-11-03 DIAGNOSIS — I10 Essential (primary) hypertension: Secondary | ICD-10-CM | POA: Insufficient documentation

## 2017-11-03 DIAGNOSIS — F329 Major depressive disorder, single episode, unspecified: Secondary | ICD-10-CM | POA: Diagnosis not present

## 2017-11-03 DIAGNOSIS — Z1211 Encounter for screening for malignant neoplasm of colon: Secondary | ICD-10-CM | POA: Diagnosis not present

## 2017-11-03 DIAGNOSIS — K5730 Diverticulosis of large intestine without perforation or abscess without bleeding: Secondary | ICD-10-CM

## 2017-11-03 HISTORY — PX: COLONOSCOPY WITH PROPOFOL: SHX5780

## 2017-11-03 LAB — POCT PREGNANCY, URINE: PREG TEST UR: NEGATIVE

## 2017-11-03 SURGERY — COLONOSCOPY WITH PROPOFOL
Anesthesia: General

## 2017-11-03 MED ORDER — PROPOFOL 500 MG/50ML IV EMUL
INTRAVENOUS | Status: AC
  Filled 2017-11-03: qty 50

## 2017-11-03 MED ORDER — PROPOFOL 10 MG/ML IV BOLUS
INTRAVENOUS | Status: DC | PRN
  Administered 2017-11-03: 20 mg via INTRAVENOUS
  Administered 2017-11-03: 80 mg via INTRAVENOUS

## 2017-11-03 MED ORDER — PROPOFOL 500 MG/50ML IV EMUL
INTRAVENOUS | Status: DC | PRN
  Administered 2017-11-03: 110 ug/kg/min via INTRAVENOUS

## 2017-11-03 MED ORDER — SODIUM CHLORIDE 0.9 % IV SOLN
INTRAVENOUS | Status: DC
  Administered 2017-11-03: 1000 mL via INTRAVENOUS

## 2017-11-03 MED ORDER — LIDOCAINE HCL (CARDIAC) PF 100 MG/5ML IV SOSY
PREFILLED_SYRINGE | INTRAVENOUS | Status: DC | PRN
  Administered 2017-11-03: 50 mg via INTRAVENOUS

## 2017-11-03 NOTE — Transfer of Care (Signed)
Immediate Anesthesia Transfer of Care Note  Patient: Elizabeth Fleming  Procedure(s) Performed: COLONOSCOPY WITH PROPOFOL (N/A )  Patient Location: PACU  Anesthesia Type:General  Level of Consciousness: sedated  Airway & Oxygen Therapy: Patient Spontanous Breathing and Patient connected to nasal cannula oxygen  Post-op Assessment: Report given to RN and Post -op Vital signs reviewed and stable  Post vital signs: Reviewed and stable  Last Vitals:  Vitals Value Taken Time  BP 113/67 11/03/2017 11:11 AM  Temp 36.1 C 11/03/2017 11:10 AM  Pulse 72 11/03/2017 11:11 AM  Resp 15 11/03/2017 11:11 AM  SpO2 100 % 11/03/2017 11:11 AM  Vitals shown include unvalidated device data.  Last Pain:  Vitals:   11/03/17 1110  TempSrc: Tympanic  PainSc:          Complications: No apparent anesthesia complications

## 2017-11-03 NOTE — Anesthesia Postprocedure Evaluation (Signed)
Anesthesia Post Note  Patient: Elizabeth Fleming  Procedure(s) Performed: COLONOSCOPY WITH PROPOFOL (N/A )  Patient location during evaluation: Endoscopy Anesthesia Type: General Level of consciousness: awake and alert Pain management: pain level controlled Vital Signs Assessment: post-procedure vital signs reviewed and stable Respiratory status: spontaneous breathing, nonlabored ventilation, respiratory function stable and patient connected to nasal cannula oxygen Cardiovascular status: blood pressure returned to baseline and stable Postop Assessment: no apparent nausea or vomiting Anesthetic complications: no     Last Vitals:  Vitals:   11/03/17 1111 11/03/17 1130  BP: 113/67 123/74  Pulse: 73   Resp: 16   Temp:    SpO2: 100%     Last Pain:  Vitals:   11/03/17 1110  TempSrc: Tympanic  PainSc:                  Precious Haws Shed Nixon

## 2017-11-03 NOTE — Op Note (Signed)
[facility] Gastroenterology Patient Name: Elizabeth Fleming Procedure Date: 11/03/2017 10:17 AM MRN: [id number] Account #: [id number] Date of Birth: 1966/04/13 Admit Type: Outpatient Age: 52 Room: [facility] ENDO ROOM 2 Gender: Female Note Status: Finalized Procedure:            Colonoscopy Indications:          Surveillance: Personal history of adenomatous polyps on                        last colonoscopy > 5 years ago Providers:            Berel Najjar B. Bonna Gains MD, MD Referring MD:         Marciano Sequin. Deborra Medina (Referring MD) Medicines:            Monitored Anesthesia Care Complications:        No immediate complications. Procedure:            Pre-Anesthesia Assessment:                       - ASA Grade Assessment: III - A patient with severe                        systemic disease.                       - Prior to the procedure, a History and Physical was                        performed, and patient medications, allergies and                        sensitivities were reviewed. The patient's tolerance of                        previous anesthesia was reviewed.                       - The risks and benefits of the procedure and the                        sedation options and risks were discussed with the                        patient. All questions were answered and informed                        consent was obtained.                       - Patient identification and proposed procedure were                        verified prior to the procedure by the physician, the                        nurse, the anesthesiologist, the anesthetist and the                        technician. The procedure was verified in the procedure  room.                       After obtaining informed consent, the colonoscope was                        passed under direct vision. Throughout the procedure,                        the patient's blood pressure, pulse, and oxygen                saturations were monitored continuously. The                        Colonoscope was introduced through the anus and                        advanced to the the cecum, identified by appendiceal                        orifice and ileocecal valve. The colonoscopy was                        performed with ease. The patient tolerated the                        procedure well. The quality of the bowel preparation                        was good. Findings:      The perianal and digital rectal examinations were normal.      A localized area of mildly thickened folds of the mucosa was found at       the ileocecal valve. Biopsies were taken with a cold forceps for       histology.      Two sessile polyps were found in the sigmoid colon. The polyps were 4 to       5 mm in size. These polyps were removed with a cold snare. Resection and       retrieval were complete.      A few small-mouthed diverticula were found in the sigmoid colon.      The exam was otherwise without abnormality.      The rectum, sigmoid colon, descending colon, transverse colon, ascending       colon and cecum appeared normal.      Anal papilla(e) were hypertrophied.      The retroflexed view of the distal rectum and anal verge was normal and       showed no anal or rectal abnormalities. Impression:           - Thickened folds of the mucosa at the ileocecal valve.                        Biopsied.                       - Two 4 to 5 mm polyps in the sigmoid colon, removed                        with a cold snare. Resected and retrieved.                       -  Diverticulosis in the sigmoid colon.                       - The examination was otherwise normal.                       - The rectum, sigmoid colon, descending colon,                        transverse colon, ascending colon and cecum are normal.                       - Anal papilla(e) were hypertrophied.                       - The distal rectum and anal  verge are normal on                        retroflexion view. Recommendation:       - Discharge patient to home (with escort).                       - Advance diet as tolerated.                       - Continue present medications.                       - Await pathology results.                       - Repeat colonoscopy in 5 years for surveillance due to                        previous history of 59mm tubulluvillous adenoma in 2011.                       - The findings and recommendations were discussed with                        the patient.                       - The findings and recommendations were discussed with                        the patient's family.                       - Return to primary care physician as previously                        scheduled.                       - High fiber diet. Procedure Code(s):    --- Professional ---                       [phone], Colonoscopy, flexible; with removal of tumor(s),                        polyp(s), or other lesion(s) by snare technique  [id number], 55, Colonoscopy, flexible; with biopsy, single                        or multiple Diagnosis Code(s):    --- Professional ---                       Z86.010, Personal history of colonic polyps                       K63.89, Other specified diseases of intestine                       D12.5, Benign neoplasm of sigmoid colon                       K62.89, Other specified diseases of anus and rectum                       K57.30, Diverticulosis of large intestine without                        perforation or abscess without bleeding CPT copyright 2017 American Medical Association. All rights reserved. The codes documented in this report are preliminary and upon coder review may  be revised to meet current compliance requirements.  Vonda Antigua, MD Margretta Sidle B. Bonna Gains MD, MD 11/03/2017 11:12:26 AM This report has been signed electronically. Number of Addenda: 0 Note  Initiated On: 11/03/2017 10:17 AM Scope Withdrawal Time: 0 hours 23 minutes 46 seconds  Total Procedure Duration: 0 hours 28 minutes 16 seconds  Estimated Blood Loss: Estimated blood loss: none. Estimated blood loss: none.      [facility]

## 2017-11-03 NOTE — Anesthesia Post-op Follow-up Note (Signed)
Anesthesia QCDR form completed.        

## 2017-11-03 NOTE — Anesthesia Preprocedure Evaluation (Signed)
Anesthesia Evaluation  Patient identified by MRN, date of birth, ID band Patient awake    Reviewed: Allergy & Precautions, H&P , NPO status , Patient's Chart, lab work & pertinent test results  History of Anesthesia Complications Negative for: history of anesthetic complications  Airway Mallampati: III  TM Distance: <3 FB Neck ROM: full    Dental  (+) Chipped, Poor Dentition   Pulmonary former smoker,  Signs and symptoms suggestive of sleep apnea            Cardiovascular hypertension, (-) angina(-) Past MI and (-) DOE      Neuro/Psych  Headaches, PSYCHIATRIC DISORDERS Depression    GI/Hepatic negative GI ROS, Neg liver ROS, GERD  Medicated and Controlled,  Endo/Other  negative endocrine ROS  Renal/GU negative Renal ROS  negative genitourinary   Musculoskeletal   Abdominal   Peds  Hematology negative hematology ROS (+)   Anesthesia Other Findings Past Medical History: No date: Allergy 2012: Colon polyps No date: Depression No date: Genital warts No date: Hypertension No date: Migraines  Past Surgical History: No date: CHOLECYSTECTOMY 1974: TONSILLECTOMY  BMI    Body Mass Index:  41.60 kg/m      Reproductive/Obstetrics negative OB ROS                             Anesthesia Physical Anesthesia Plan  ASA: III  Anesthesia Plan: General   Post-op Pain Management:    Induction: Intravenous  PONV Risk Score and Plan: Propofol infusion and TIVA  Airway Management Planned: Natural Airway and Nasal Cannula  Additional Equipment:   Intra-op Plan:   Post-operative Plan:   Informed Consent: I have reviewed the patients History and Physical, chart, labs and discussed the procedure including the risks, benefits and alternatives for the proposed anesthesia with the patient or authorized representative who has indicated his/her understanding and acceptance.   Dental Advisory  Given  Plan Discussed with: Anesthesiologist, CRNA and Surgeon  Anesthesia Plan Comments: (Patient consented for risks of anesthesia including but not limited to:  - adverse reactions to medications - risk of intubation if required - damage to teeth, lips or other oral mucosa - sore throat or hoarseness - Damage to heart, brain, lungs or loss of life  Patient voiced understanding.)        Anesthesia Quick Evaluation

## 2017-11-03 NOTE — H&P (Addendum)
Vonda Antigua, MD [address] [place], [place], [place], [address] [address], [place], [place], [place], [address] Phone: [phone]  Fax: [phone]  Primary Care Physician:  Lucille Passy, MD   Pre-Procedure History & Physical: HPI:  Elizabeth Fleming is a 52 y.o. female is here for a colonoscopy.   Past Medical History:  Diagnosis Date  . Allergy   . Colon polyps 2012  . Depression   . Genital warts   . Hypertension   . Migraines     Past Surgical History:  Procedure Laterality Date  . CHOLECYSTECTOMY    . TONSILLECTOMY  1974    Prior to Admission medications   Medication Sig Start Date End Date Taking? Authorizing Provider  albuterol (PROVENTIL HFA;VENTOLIN HFA) 108 (90 Base) MCG/ACT inhaler Inhale 2 puffs into the lungs every 6 (six) hours as needed. 08/14/15  Yes Lucille Passy, MD  atenolol (TENORMIN) 25 MG tablet TAKE 1 TABLET (25 MG TOTAL) BY MOUTH DAILY. 10/24/17  Yes Lucille Passy, MD  atorvastatin (LIPITOR) 20 MG tablet Take 1 tablet (20 mg total) by mouth daily. 10/23/17  Yes Lucille Passy, MD  buPROPion (WELLBUTRIN XL) 150 MG 24 hr tablet Take 1 tablet (150 mg total) by mouth daily. 10/24/17  Yes Lucille Passy, MD  cetirizine (ZYRTEC) 10 MG tablet Take 1 tablet (10 mg total) by mouth daily. 01/26/17  Yes Lucille Passy, MD  fish oil-omega-3 fatty acids 1000 MG capsule Take 1 g by mouth daily.     Yes [provider]  fluticasone (FLONASE) 50 MCG/ACT nasal spray Place 2 sprays into both nostrils daily. 01/26/17  Yes Lucille Passy, MD  metFORMIN (GLUCOPHAGE) 500 MG tablet TAKE 1 TABLET BY MOUTH TWICE DAILY WITH A MEAL. 10/24/17  Yes Lucille Passy, MD  metroNIDAZOLE (FLAGYL) 500 MG tablet Take 1 tablet (500 mg total) by mouth 2 (two) times daily. 09/14/17  Yes Lucille Passy, MD    Allergies as of 09/14/2017 - Review Complete 09/13/2017  Allergen Reaction Noted  . Iodine  11/16/2007    Family History  Adopted: Yes  Problem Relation Age of  Onset  . Breast cancer Neg Hx     Social History   Socioeconomic History  . Marital status: Married    Spouse name: Bari Edward  . Number of children: 2  . Years of education: 29  . Highest education level: Not on file  Occupational History  . Occupation: Engineer, maintenance: [employer]  . Financial resource strain: Not on file  . Food insecurity:    Worry: Not on file    Inability: Not on file  . Transportation needs:    Medical: Not on file    Non-medical: Not on file  Tobacco Use  . Smoking status: Former Smoker    Last attempt to quit: 08/27/2006    Years since quitting: 11.1  . Smokeless tobacco: Never Used  Substance and Sexual Activity  . Alcohol use: No    Alcohol/week: 0.0 oz  . Drug use: No  . Sexual activity: Yes  Lifestyle  . Physical activity:    Days per week: Not on file    Minutes per session: Not on file  . Stress: Not on file  Relationships  . Social connections:    Talks on phone: Not on file    Gets together: Not on file  Attends religious service: Not on file    Active member of club or organization: Not on file    Attends meetings of clubs or organizations: Not on file    Relationship status: Not on file  . Intimate partner violence:    Fear of current or ex partner: Not on file    Emotionally abused: Not on file    Physically abused: Not on file    Forced sexual activity: Not on file  Other Topics Concern  . Not on file  Social History Narrative  . Not on file    Review of Systems: See HPI, otherwise negative ROS  Physical Exam: BP 128/84   Pulse 72   Temp (!) 97 F (36.1 C)   Resp 20   Ht 5\' 5"  (1.651 m)   Wt 250 lb (113.4 kg)   LMP 10/08/2017 Comment: Urine Preg. Negative  SpO2 100%   BMI 41.60 kg/m  General:   Alert,  pleasant and cooperative in NAD Head:  Normocephalic and atraumatic. Neck:  Supple; no masses or thyromegaly. Lungs:  Clear throughout to  auscultation, normal respiratory effort.    Heart:  +S1, +S2, Regular rate and rhythm, No edema. Abdomen:  Soft, nontender and nondistended. Normal bowel sounds, without guarding, and without rebound.   Neurologic:  Alert and  oriented x4;  grossly normal neurologically.  Impression/Plan: Elizabeth Fleming is here for a colonoscopy to be performed for polyp surveillance. Last colonoscopy in 2011 by Dr. Olevia Perches for diarrahea showed a 18mm polyp that was removed and showed TVA. TI and random colon biopsies were normal.   Risks, benefits, limitations, and alternatives regarding  colonoscopy have been reviewed with the patient.  Questions have been answered.  All parties agreeable.   Virgel Manifold, MD  11/03/2017, 10:15 AM

## 2017-11-06 ENCOUNTER — Encounter: Payer: Self-pay | Admitting: Gastroenterology

## 2017-11-06 ENCOUNTER — Other Ambulatory Visit: Payer: Self-pay | Admitting: Family Medicine

## 2017-11-06 ENCOUNTER — Encounter: Payer: Self-pay | Admitting: Family Medicine

## 2017-11-06 DIAGNOSIS — R079 Chest pain, unspecified: Secondary | ICD-10-CM

## 2017-11-06 LAB — SURGICAL PATHOLOGY

## 2017-11-06 MED ORDER — ASPIRIN 81 MG PO TABS: 81.0000 mg | ORAL_TABLET | Freq: Every day | ORAL | Status: DC

## 2017-11-08 ENCOUNTER — Encounter: Payer: Self-pay | Admitting: Gastroenterology

## 2017-11-10 ENCOUNTER — Encounter: Payer: Self-pay | Admitting: Nurse Practitioner

## 2017-11-10 ENCOUNTER — Ambulatory Visit: Payer: Self-pay | Admitting: Nurse Practitioner

## 2017-11-10 ENCOUNTER — Encounter: Payer: Self-pay | Admitting: Family Medicine

## 2017-11-10 VITALS — BP 110/70 | HR 64 | Temp 97.4°F | Wt 254.0 lb

## 2017-11-10 DIAGNOSIS — R300 Dysuria: Secondary | ICD-10-CM

## 2017-11-10 DIAGNOSIS — N390 Urinary tract infection, site not specified: Secondary | ICD-10-CM

## 2017-11-10 LAB — POCT URINALYSIS DIPSTICK
BILIRUBIN UA: NEGATIVE
Blood, UA: NEGATIVE
GLUCOSE UA: NEGATIVE
KETONES UA: NEGATIVE
Nitrite, UA: NEGATIVE
PH UA: 6 (ref 5.0–8.0)
Protein, UA: NEGATIVE
Spec Grav, UA: 1.01 (ref 1.010–1.025)
UROBILINOGEN UA: 0.2 U/dL

## 2017-11-10 LAB — GLUCOSE, POCT (MANUAL RESULT ENTRY): POC Glucose: 97 mg/dl (ref 70–99)

## 2017-11-10 MED ORDER — NITROFURANTOIN MONOHYD MACRO 100 MG PO CAPS: 100.0000 mg | ORAL_CAPSULE | Freq: Two times a day (BID) | ORAL | 0 refills | Status: AC

## 2017-11-10 MED ORDER — FLUCONAZOLE 150 MG PO TABS: 150.0000 mg | ORAL_TABLET | Freq: Every day | ORAL | 0 refills | Status: AC

## 2017-11-10 NOTE — Progress Notes (Signed)
Subjective:    Elizabeth Fleming is a 52 y.o. female who complains of dysuria, frequency and urgency for 1 week.  Patient also complains of vaginal itching. Patient denies back pain, fever, headache, sorethroat, stomach ache and vaginal discharge.  Patient does not have a history of recurrent UTI.  Patient does not have a history of pyelonephritis.  The patient further denies foul smelling urine, cloudy urine, dribbling, abdominal pain, suprapubic pain or hesitancy.  The patient does have a history of DM with her last AIC 5.6.  The patient has not taken any medications for her symptoms to date.   The following portions of the patient's history were reviewed and updated as appropriate: allergies, current medications and past medical history. Review of Systems Constitutional: negative Eyes: negative Respiratory: negative Cardiovascular: negative Gastrointestinal: negative Genitourinary:positive for vaginal itching, SEE HPI, dysuria, frequency and urgency, negative for vaginal discharge, decreased stream, nocturia and urinary incontinence    Objective:    BP 110/70   Pulse 64   Temp (!) 97.4 F (36.3 C)   Wt 254 lb (115.2 kg)   LMP 10/16/2017   SpO2 99%   BMI 42.27 kg/m  General: alert, cooperative and no distress  Abdomen: soft, non-tender, without masses or organomegaly in the entire abdomen  Back: back muscles are full ROM  GU: defer exam   Laboratory:  Urine dipstick shows sp gravity 1.010, negative for glucose, negative for hemoglobin, negative for ketones, trace for leukocyte esterase, negative for nitrites, negative for protein and negative for urobilinogen.   Micro exam: not done.    Assessment:    Acute cystitis    Plan: Plan:    1. Medications: nitrofurantoin and Diflucan 2. Maintain adequate hydration 3. Follow up if symptoms not improving, and prn.  Patient instructed to avoid caffeine, to include coffee, tea and sodas.  The patient instructed to increase fluids  and to also use Azo if the dysuria worsens.  Patient refused pyridium at time of visit.  Patient education provided.  Patient verbalizes understanding and has no questions at time of discharge.

## 2017-11-10 NOTE — Patient Instructions (Addendum)
Vaginitis Vaginitis is a condition in which the vaginal tissue swells and becomes red (inflamed). This condition is most often caused by a change in the normal balance of bacteria and yeast that live in the vagina. This change causes an overgrowth of certain bacteria or yeast, which causes the inflammation. There are different types of vaginitis, but the most common types are:  Bacterial vaginosis.  Yeast infection (candidiasis).  Trichomoniasis vaginitis. This is a sexually transmitted disease (STD).  Viral vaginitis.  Atrophic vaginitis.  Allergic vaginitis.  What are the causes? The cause of this condition depends on the type of vaginitis. It can be caused by:  Bacteria (bacterial vaginosis).  Yeast, which is a fungus (yeast infection).  A parasite (trichomoniasis vaginitis).  A virus (viral vaginitis).  Low hormone levels (atrophic vaginitis). Low hormone levels can occur during pregnancy, breastfeeding, or after menopause.  Irritants, such as bubble baths, scented tampons, and feminine sprays (allergic vaginitis).  Other factors can change the normal balance of the yeast and bacteria that live in the vagina. These include:  Antibiotic medicines.  Poor hygiene.  Diaphragms, vaginal sponges, spermicides, birth control pills, and intrauterine devices (IUD).  Sex.  Infection.  Uncontrolled diabetes.  A weakened defense (immune) system.  What increases the risk? This condition is more likely to develop in women who:  Smoke.  Use vaginal douches, scented tampons, or scented sanitary pads.  Wear tight-fitting pants.  Wear thong underwear.  Use oral birth control pills or an IUD.  Have sex without a condom.  Have multiple sex partners.  Have an STD.  Frequently use the spermicide nonoxynol-9.  Eat lots of foods high in sugar.  Have uncontrolled diabetes.  Have low estrogen levels.  Have a weakened immune system from an immune disorder or medical  treatment.  Are pregnant or breastfeeding.  What are the signs or symptoms? Symptoms vary depending on the cause of the vaginitis. Common symptoms include:  Abnormal vaginal discharge. ? The discharge is white, gray, or yellow with bacterial vaginosis. ? The discharge is thick, white, and cheesy with a yeast infection. ? The discharge is frothy and yellow or greenish with trichomoniasis.  A bad vaginal smell. The smell is fishy with bacterial vaginosis.  Vaginal itching, pain, or swelling.  Sex that is painful.  Pain or burning when urinating.  Sometimes there are no symptoms. How is this diagnosed? This condition is diagnosed based on your symptoms and medical history. A physical exam, including a pelvic exam, will also be done. You may also have other tests, including:  Tests to determine the pH level (acidity or alkalinity) of your vagina.  A whiff test, to assess the odor that results when a sample of your vaginal discharge is mixed with a potassium hydroxide solution.  Tests of vaginal fluid. A sample will be examined under a microscope.  How is this treated? Treatment varies depending on the type of vaginitis you have. Your treatment may include:  Antibiotic creams or pills to treat bacterial vaginosis and trichomoniasis.  Antifungal medicines, such as vaginal creams or suppositories, to treat a yeast infection.  Medicine to ease discomfort if you have viral vaginitis. Your sexual partner should also be treated.  Estrogen delivered in a cream, pill, suppository, or vaginal ring to treat atrophic vaginitis. If vaginal dryness occurs, lubricants and moisturizing creams may help. You may need to avoid scented soaps, sprays, or douches.  Stopping use of a product that is causing allergic vaginitis. Then using a vaginal  Medicine to ease discomfort if you have viral vaginitis. Your sexual partner should also be treated.  · Estrogen delivered in a cream, pill, suppository, or vaginal ring to treat atrophic vaginitis. If vaginal dryness occurs, lubricants and moisturizing creams may help. You may need to avoid scented soaps, sprays, or douches.  · Stopping use of a product that is causing allergic vaginitis. Then using a vaginal cream to treat the symptoms.    Follow these instructions at home:  Lifestyle  · Keep your genital area clean and dry. Avoid soap, and only rinse the area  with water.  · Do not douche or use tampons until your health care provider says it is okay to do so. Use sanitary pads, if needed.  · Do not have sex until your health care provider approves. When you can return to sex, practice safe sex and use condoms.  · Wipe from front to back. This avoids the spread of bacteria from the rectum to the vagina.  General instructions  · Take over-the-counter and prescription medicines only as told by your health care provider.  · If you were prescribed an antibiotic medicine, take or use it as told by your health care provider. Do not stop taking or using the antibiotic even if you start to feel better.  · Keep all follow-up visits as told by your health care provider. This is important.  How is this prevented?  · Use mild, non-scented products. Do not use things that can irritate the vagina, such as fabric softeners. Avoid the following products if they are scented:  ? Feminine sprays.  ? Detergents.  ? Tampons.  ? Feminine hygiene products.  ? Soaps or bubble baths.  · Let air reach your genital area.  ? Wear cotton underwear to reduce moisture buildup.  ? Avoid wearing underwear while you sleep.  ? Avoid wearing tight pants and underwear or nylons without a cotton panel.  ? Avoid wearing thong underwear.  · Take off any wet clothing, such as bathing suits, as soon as possible.  · Practice safe sex and use condoms.  Contact a health care provider if:  · You have abdominal pain.  · You have a fever.  · You have symptoms that last for more than 2-3 days.  Get help right away if:  · You have a fever and your symptoms suddenly get worse.  Summary  · Vaginitis is a condition in which the vaginal tissue becomes inflamed.This condition is most often caused by a change in the normal balance of bacteria and yeast that live in the vagina.  · Treatment varies depending on the type of vaginitis you have.  · Do not douche, use tampons , or have sex until your health care provider approves.  When you can return to sex, practice safe sex and use condoms.  This information is not intended to replace advice given to you by your health care provider. Make sure you discuss any questions you have with your health care provider.  Document Released: 03/20/2007 Document Revised: 06/28/2016 Document Reviewed: 06/28/2016  Elsevier Interactive Patient Education © 2018 [employer]    Urinary Tract Infection, Adult  A urinary tract infection (UTI) is an infection of any part of the urinary tract, which includes the kidneys, ureters, bladder, and urethra. These organs make, store, and get rid of urine in the body. UTI can be a bladder infection (cystitis) or kidney infection (pyelonephritis).  What are the causes?  This infection may be   caused by fungi, viruses, or bacteria. Bacteria are the most common cause of UTIs. This condition can also be caused by repeated incomplete emptying of the bladder during urination.  What increases the risk?  This condition is more likely to develop if:  · You ignore your need to urinate or hold urine for long periods of time.  · You do not empty your bladder completely during urination.  · You wipe back to front after urinating or having a bowel movement, if you are female.  · You are uncircumcised, if you are female.  · You are constipated.  · You have a urinary catheter that stays in place (indwelling).  · You have a weak defense (immune) system.  · You have a medical condition that affects your bowels, kidneys, or bladder.  · You have diabetes.  · You take antibiotic medicines frequently or for long periods of time, and the antibiotics no longer work well against certain types of infections (antibiotic resistance).  · You take medicines that irritate your urinary tract.  · You are exposed to chemicals that irritate your urinary tract.  · You are female.    What are the signs or symptoms?  Symptoms of this condition include:  · Fever.  · Frequent urination or passing small amounts  of urine frequently.  · Needing to urinate urgently.  · Pain or burning with urination.  · Urine that smells bad or unusual.  · Cloudy urine.  · Pain in the lower abdomen or back.  · Trouble urinating.  · Blood in the urine.  · Vomiting or being less hungry than normal.  · Diarrhea or abdominal pain.  · Vaginal discharge, if you are female.    How is this diagnosed?  This condition is diagnosed with a medical history and physical exam. You will also need to provide a urine sample to test your urine. Other tests may be done, including:  · Blood tests.  · Sexually transmitted disease (STD) testing.    If you have had more than one UTI, a cystoscopy or imaging studies may be done to determine the cause of the infections.  How is this treated?  Treatment for this condition often includes a combination of two or more of the following:  · Antibiotic medicine.  · Other medicines to treat less common causes of UTI.  · Over-the-counter medicines to treat pain.  · Drinking enough water to stay hydrated.    Follow these instructions at home:  · Take over-the-counter and prescription medicines only as told by your health care provider.  · If you were prescribed an antibiotic, take it as told by your health care provider. Do not stop taking the antibiotic even if you start to feel better.  · Avoid alcohol, caffeine, tea, and carbonated beverages. They can irritate your bladder.  · Drink enough fluid to keep your urine clear or pale yellow.  · Keep all follow-up visits as told by your health care provider. This is important.  · Make sure to:  ? Empty your bladder often and completely. Do not hold urine for long periods of time.  ? Empty your bladder before and after sex.  ? Wipe from front to back after a bowel movement if you are female. Use each tissue one time when you wipe.  Contact a health care provider if:  · You have back pain.  · You have a fever.  · You feel nauseous or vomit.  · Your symptoms do

## 2017-11-14 ENCOUNTER — Telehealth: Payer: Self-pay | Admitting: Emergency Medicine

## 2017-11-14 NOTE — Telephone Encounter (Signed)
Spoke with patient stated that she is doing better. 

## 2017-11-21 ENCOUNTER — Telehealth: Payer: Self-pay | Admitting: Gastroenterology

## 2017-11-21 NOTE — Telephone Encounter (Signed)
I tried to reach pt but had to leave msg to contact office. Please see results in labs.

## 2017-11-21 NOTE — Telephone Encounter (Signed)
Pt left vm she states she just received a vm from [place] from 11/15/17 she can be reached on her cell

## 2017-11-22 ENCOUNTER — Other Ambulatory Visit: Payer: Self-pay | Admitting: Family Medicine

## 2017-11-22 DIAGNOSIS — E782 Mixed hyperlipidemia: Secondary | ICD-10-CM | POA: Diagnosis not present

## 2017-11-23 LAB — COMPREHENSIVE METABOLIC PANEL
ALBUMIN: 4.2 g/dL (ref 3.5–5.5)
ALK PHOS: 126 IU/L — AB (ref 39–117)
ALT: 35 IU/L — ABNORMAL HIGH (ref 0–32)
AST: 29 IU/L (ref 0–40)
Albumin/Globulin Ratio: 1.7 (ref 1.2–2.2)
BILIRUBIN TOTAL: 0.7 mg/dL (ref 0.0–1.2)
BUN/Creatinine Ratio: 18 (ref 9–23)
BUN: 16 mg/dL (ref 6–24)
CHLORIDE: 103 mmol/L (ref 96–106)
CO2: 23 mmol/L (ref 20–29)
CREATININE: 0.89 mg/dL (ref 0.57–1.00)
Calcium: 9.1 mg/dL (ref 8.7–10.2)
GFR calc Af Amer: 87 mL/min/{1.73_m2} (ref >59)
GFR calc non Af Amer: 76 mL/min/{1.73_m2} (ref >59)
GLOBULIN, TOTAL: 2.5 g/dL (ref 1.5–4.5)
GLUCOSE: 104 mg/dL — AB (ref 65–99)
Potassium: 4.8 mmol/L (ref 3.5–5.2)
SODIUM: 140 mmol/L (ref 134–144)
Total Protein: 6.7 g/dL (ref 6.0–8.5)

## 2017-11-23 LAB — LIPID PANEL W/O CHOL/HDL RATIO
CHOLESTEROL TOTAL: 182 mg/dL (ref 100–199)
HDL: 46 mg/dL (ref >39)
LDL CALC: 96 mg/dL (ref 0–99)
TRIGLYCERIDES: 199 mg/dL — AB (ref 0–149)
VLDL Cholesterol Cal: 40 mg/dL (ref 5–40)

## 2017-12-05 NOTE — Progress Notes (Signed)
12/06/2017 11:28 AM   Elizabeth Fleming 1965-11-18 [id number]  Referring provider: Teodoro Spray, MD [address] [place], [place] [address]  Chief Complaint  Patient presents with  . Hematuria    HPI: Patient is a 52 -year-old Caucasian female who presents today as a referral from Carter Kitten, NP for UTI symptoms.     She states she has had seen foam in her urine off and on.  She is seeing the foam every two to three weeks.  The foam will last two to three days.  She has had burning and itching in the vaginal area.  She was given an antibiotic and something for the yeast.  She has a fullness and tight feeling on the right flank pain.  This feeling lasts for 4 to 5 days.  She experiences fatigue at the same time.    She states that her first urine dipstick showed blood, sugar and protein.  She states it was not recorded.    She does not have a prior history of recurrent urinary tract infections, nephrolithiasis, trauma to the genitourinary tract, or malignancies of the genitourinary tract.   She is adopted.    Today, she is having symptoms of frequent urination, urgency and occasional nocturia.  Patient denies any gross hematuria, dysuria or suprapubic/flank pain.  Patient denies any fevers, chills, nausea or vomiting.  Her UA today is negative.    She is not a smoker. She is not around second hand smoke.  She has not worked with Sports administrator, trichloroethylene, etc.   She has HTN.    She has a high BMI.     PMH: Past Medical History:  Diagnosis Date  . Allergy   . Colon polyps 2012  . Depression   . Genital warts   . Hypertension   . Migraines     Surgical History: Past Surgical History:  Procedure Laterality Date  . CHOLECYSTECTOMY    . COLONOSCOPY WITH PROPOFOL N/A 11/03/2017   Procedure: COLONOSCOPY WITH PROPOFOL;  Surgeon: Virgel Manifold, MD;  Location: [facility] ENDOSCOPY;  Service: Endoscopy;  Laterality: N/A;  . TONSILLECTOMY  1974     Home Medications:  Allergies as of 12/06/2017      Reactions   Iodine       Medication List        Accurate as of 12/06/17 11:28 AM. Always use your most recent med list.          albuterol 108 (90 Base) MCG/ACT inhaler Commonly known as:  PROVENTIL HFA;VENTOLIN HFA Inhale 2 puffs into the lungs every 6 (six) hours as needed.   aspirin 81 MG tablet Take 1 tablet (81 mg total) by mouth daily.   atenolol 25 MG tablet Commonly known as:  TENORMIN TAKE 1 TABLET (25 MG TOTAL) BY MOUTH DAILY.   atorvastatin 20 MG tablet Commonly known as:  LIPITOR Take 1 tablet (20 mg total) by mouth daily.   buPROPion 150 MG 24 hr tablet Commonly known as:  WELLBUTRIN XL Take 1 tablet (150 mg total) by mouth daily.   cetirizine 10 MG tablet Commonly known as:  ZYRTEC Take 1 tablet (10 mg total) by mouth daily.   fish oil-omega-3 fatty acids 1000 MG capsule Take 1 g by mouth daily.   fluticasone 50 MCG/ACT nasal spray Commonly known as:  FLONASE Place 2 sprays into both nostrils daily.   metFORMIN 500 MG tablet Commonly known as:  GLUCOPHAGE TAKE 1 TABLET BY MOUTH  TWICE DAILY WITH A MEAL.       Allergies:  Allergies  Allergen Reactions  . Iodine     Family History: Family History  Adopted: Yes  Problem Relation Age of Onset  . Breast cancer Neg Hx     Social History:  reports that she quit smoking about 11 years ago. She has never used smokeless tobacco. She reports that she does not drink alcohol or use drugs.  ROS: UROLOGY Frequent Urination?: Yes Hard to postpone urination?: Yes Burning/pain with urination?: No Get up at night to urinate?: Yes Leakage of urine?: No Urine stream starts and stops?: No Trouble starting stream?: No Do you have to strain to urinate?: No Blood in urine?: Yes Urinary tract infection?: No Sexually transmitted disease?: No Injury to kidneys or bladder?: No Painful intercourse?: No Weak stream?: No Currently pregnant?:  No Vaginal bleeding?: No Last menstrual period?: n  Gastrointestinal Nausea?: No Vomiting?: No Indigestion/heartburn?: No Diarrhea?: No Constipation?: No  Constitutional Fever: No Night sweats?: No Weight loss?: No Fatigue?: No  Skin Skin rash/lesions?: No Itching?: No  Eyes Blurred vision?: Yes Double vision?: No  Ears/Nose/Throat Sore throat?: No Sinus problems?: No  Hematologic/Lymphatic Swollen glands?: No Easy bruising?: No  Cardiovascular Leg swelling?: Yes Chest pain?: No  Respiratory Cough?: No Shortness of breath?: No  Endocrine Excessive thirst?: No  Musculoskeletal Back pain?: No Joint pain?: No  Neurological Headaches?: No Dizziness?: No  Psychologic Depression?: No Anxiety?: No  Physical Exam: BP 130/85 (BP Location: Right Arm, Patient Position: Sitting, Cuff Size: Large)   Pulse 69   Ht 5\' 5"  (1.651 m)   Wt 251 lb (113.9 kg)   BMI 41.77 kg/m   Constitutional:  Well nourished. Alert and oriented, No acute distress. HEENT: [place] AT, moist mucus membranes.  Trachea midline, no masses. Cardiovascular: No clubbing, cyanosis, or edema. Respiratory: Normal respiratory effort, no increased work of breathing. GI: Abdomen is soft, non tender, non distended, no abdominal masses. Liver and spleen not palpable.  No hernias appreciated.  Stool sample for occult testing is not indicated.   GU: No CVA tenderness.  No bladder fullness or masses.   Skin: No rashes, bruises or suspicious lesions. Lymph: No cervical or inguinal adenopathy. Neurologic: Grossly intact, no focal deficits, moving all 4 extremities. Psychiatric: Normal mood and affect.  Laboratory Data: Lab Results  Component Value Date   WBC 9.0 09/23/2016   HGB 13.7 09/23/2016   HCT 39.6 09/23/2016   MCV 85.6 09/23/2016   PLT 234 09/23/2016    Lab Results  Component Value Date   CREATININE 0.89 11/22/2017    No results found for: PSA  No results found for:  TESTOSTERONE  Lab Results  Component Value Date   HGBA1C 5.6 09/13/2017    Lab Results  Component Value Date   TSH 1.73 09/13/2017       Component Value Date/Time   CHOL 182 11/22/2017 0929   HDL 46 11/22/2017 0929   CHOLHDL 5 09/13/2017 0809   VLDL 46.8 (H) 09/13/2017 0809   LDLCALC 96 11/22/2017 0929    Lab Results  Component Value Date   AST 29 11/22/2017   Lab Results  Component Value Date   ALT 35 (H) 11/22/2017   No components found for: ALKALINEPHOPHATASE No components found for: BILIRUBINTOTAL  No results found for: ESTRADIOL   Urinalysis See HPI and Epic.     I have reviewed the labs.  Pertinent Imaging: CT scan in 09/2014 noted bilateral adrenal adenomas  Assessment & Plan:    1.  Foam in urine Explained to the patient that this may be of insignificant findings or it may indicate renal dysfunction Her UA today was negative for protein, RBCs, WBC's and also no casts were seen BUN + creatinine are pending  2.  Right-sided flank pain Patient is experiencing intermittent flank pain with fullness on the right Patient will call when she is experiencing this right-sided flank pain so we may order a renal ultrasound STAT while she is having the symptoms     Return for STAT RUS see note .  These notes generated with voice recognition software. I apologize for typographical errors.  Zara Council, PA-C  [facility] [address] [place]  [place], [place] [address] [phone]

## 2017-12-06 ENCOUNTER — Ambulatory Visit (INDEPENDENT_AMBULATORY_CARE_PROVIDER_SITE_OTHER): Payer: BLUE CROSS/BLUE SHIELD | Admitting: Urology

## 2017-12-06 ENCOUNTER — Encounter: Payer: Self-pay | Admitting: Urology

## 2017-12-06 VITALS — BP 130/85 | HR 69 | Ht 65.0 in | Wt 251.0 lb

## 2017-12-06 DIAGNOSIS — R109 Unspecified abdominal pain: Secondary | ICD-10-CM | POA: Diagnosis not present

## 2017-12-06 DIAGNOSIS — R82998 Other abnormal findings in urine: Secondary | ICD-10-CM | POA: Diagnosis not present

## 2017-12-06 DIAGNOSIS — R3129 Other microscopic hematuria: Secondary | ICD-10-CM | POA: Diagnosis not present

## 2017-12-06 LAB — URINALYSIS, COMPLETE
BILIRUBIN UA: NEGATIVE
GLUCOSE, UA: NEGATIVE
Ketones, UA: NEGATIVE
Leukocytes, UA: NEGATIVE
Nitrite, UA: NEGATIVE
PROTEIN UA: NEGATIVE
RBC, UA: NEGATIVE
Specific Gravity, UA: <1.005 — ABNORMAL LOW (ref 1.005–1.030)
Urobilinogen, Ur: 0.2 mg/dL (ref 0.2–1.0)
pH, UA: 5.5 (ref 5.0–7.5)

## 2017-12-07 LAB — BUN+CREAT
BUN/Creatinine Ratio: 15 (ref 9–23)
BUN: 13 mg/dL (ref 6–24)
CREATININE: 0.85 mg/dL (ref 0.57–1.00)
GFR calc Af Amer: 92 mL/min/{1.73_m2} (ref >59)
GFR calc non Af Amer: 80 mL/min/{1.73_m2} (ref >59)

## 2017-12-09 LAB — CULTURE, URINE COMPREHENSIVE

## 2017-12-25 ENCOUNTER — Other Ambulatory Visit: Payer: Self-pay

## 2017-12-25 ENCOUNTER — Encounter: Payer: Self-pay | Admitting: Family Medicine

## 2017-12-25 MED ORDER — ATENOLOL 25 MG PO TABS: ORAL_TABLET | ORAL | 3 refills | Status: DC

## 2017-12-27 ENCOUNTER — Other Ambulatory Visit: Payer: Self-pay

## 2017-12-27 DIAGNOSIS — H169 Unspecified keratitis: Secondary | ICD-10-CM | POA: Diagnosis not present

## 2017-12-27 MED ORDER — ATENOLOL 25 MG PO TABS: ORAL_TABLET | ORAL | 3 refills | Status: DC

## 2017-12-27 NOTE — Progress Notes (Signed)
Per 6.3.19 communication Atenolol was increased to bid/sent Rx to [facility] per pt req/thx dmf

## 2017-12-28 ENCOUNTER — Other Ambulatory Visit: Payer: Self-pay

## 2017-12-28 MED ORDER — ATENOLOL 50 MG PO TABS: ORAL_TABLET | ORAL | 3 refills | Status: DC

## 2018-01-03 ENCOUNTER — Encounter: Payer: Self-pay | Admitting: Internal Medicine

## 2018-01-03 ENCOUNTER — Ambulatory Visit (INDEPENDENT_AMBULATORY_CARE_PROVIDER_SITE_OTHER): Payer: BLUE CROSS/BLUE SHIELD | Admitting: Internal Medicine

## 2018-01-03 VITALS — BP 122/82 | HR 62 | Ht 65.0 in | Wt 248.2 lb

## 2018-01-03 DIAGNOSIS — I10 Essential (primary) hypertension: Secondary | ICD-10-CM | POA: Diagnosis not present

## 2018-01-03 DIAGNOSIS — R0789 Other chest pain: Secondary | ICD-10-CM

## 2018-01-03 DIAGNOSIS — R002 Palpitations: Secondary | ICD-10-CM | POA: Diagnosis not present

## 2018-01-03 NOTE — Patient Instructions (Signed)
Medication Instructions:  Your physician recommends that you continue on your current medications as directed. Please refer to the Current Medication list given to you today.   Labwork: NONE  Testing/Procedures: Your physician has requested that you have cardiac CT. Cardiac computed tomography (CT) is a painless test that uses an x-ray machine to take clear, detailed pictures of your heart. For further information please visit [facility]. Please follow instruction sheet as given.  Myocardial perfusion stress test  Exercise Stress echocardiogram  Follow-Up: Your physician recommends that you schedule a follow-up appointment in: 3 MONTHS WITH DR END.   If you need a refill on your cardiac medications before your next appointment, please call your pharmacy.    Cardiac Nuclear Scan (MYOVIEW STRESS TEST) A cardiac nuclear scan is a test that measures blood flow to the heart when a person is resting and when he or she is exercising. The test looks for problems such as:  Not enough blood reaching a portion of the heart.  The heart muscle not working normally.  You may need this test if:  You have heart disease.  You have had abnormal lab results.  You have had heart surgery or angioplasty.  You have chest pain.  You have shortness of breath.  In this test, a radioactive dye (tracer) is injected into your bloodstream. After the tracer has traveled to your heart, an imaging device is used to measure how much of the tracer is absorbed by or distributed to various areas of your heart. This procedure is usually done at a hospital and takes 2-4 hours. Tell a health care provider about:  Any allergies you have.  All medicines you are taking, including vitamins, herbs, eye drops, creams, and over-the-counter medicines.  Any problems you or family members have had with the use of anesthetic medicines.  Any blood disorders you have.  Any surgeries you have had.  Any  medical conditions you have.  Whether you are pregnant or may be pregnant. What are the risks? Generally, this is a safe procedure. However, problems may occur, including:  Serious chest pain and heart attack. This is only a risk if the stress portion of the test is done.  Rapid heartbeat.  Sensation of warmth in your chest. This usually passes quickly.  What happens before the procedure?  Ask your health care provider about changing or stopping your regular medicines. This is especially important if you are taking diabetes medicines or blood thinners.  Remove your jewelry on the day of the procedure. What happens during the procedure?  An IV tube will be inserted into one of your veins.  Your health care provider will inject a small amount of radioactive tracer through the tube.  You will wait for 20-40 minutes while the tracer travels through your bloodstream.  Your heart activity will be monitored with an electrocardiogram (ECG).  You will lie down on an exam table.  Images of your heart will be taken for about 15-20 minutes.  You may be asked to exercise on a treadmill or stationary bike. While you exercise, your heart's activity will be monitored with an ECG, and your blood pressure will be checked. If you are unable to exercise, you may be given a medicine to increase blood flow to parts of your heart.  When blood flow to your heart has peaked, a tracer will again be injected through the IV tube.  After 20-40 minutes, you will get back on the exam table and have more images  taken of your heart.  When the procedure is over, your IV tube will be removed. The procedure may vary among health care providers and hospitals. Depending on the type of tracer used, scans may need to be repeated 3-4 hours later. What happens after the procedure?  Unless your health care provider tells you otherwise, you may return to your normal schedule, including diet, activities, and  medicines.  Unless your health care provider tells you otherwise, you may increase your fluid intake. This will help flush the contrast dye from your body. Drink enough fluid to keep your urine clear or pale yellow.  It is up to you to get your test results. Ask your health care provider, or the department that is doing the test, when your results will be ready. Summary  A cardiac nuclear scan measures the blood flow to the heart when a person is resting and when he or she is exercising.  You may need this test if you are at risk for heart disease.  Tell your health care provider if you are pregnant.  Unless your health care provider tells you otherwise, increase your fluid intake. This will help flush the contrast dye from your body. Drink enough fluid to keep your urine clear or pale yellow. This information is not intended to replace advice given to you by your health care provider. Make sure you discuss any questions you have with your health care provider. Document Released: 06/17/2004 Document Revised: 05/25/2016 Document Reviewed: 05/01/2013 Elsevier Interactive Patient Education  2017 [employer].     Exercise Stress Echocardiogram An exercise stress echocardiogram is a test that checks how well your heart is working. For this test, you will walk on a treadmill to make your heart beat faster. This test uses sound waves (ultrasound) and a computer to make pictures (images) of your heart. These pictures will be taken before you exercise and after you exercise. What happens before the procedure?  Follow instructions from your doctor about what you cannot eat or drink before the test.  Do not drink or eat anything that has caffeine in it. Stop having caffeine for 24 hours before the test.  Ask your doctor about changing or stopping your normal medicines. This is important if you take diabetes medicines or blood thinners. Ask your doctor if you should take your medicines with water  before the test.  If you use an inhaler, bring it to the test.  Do not use any products that have nicotine or tobacco in them, such as cigarettes and e-cigarettes. Stop using them for 4 hours before the test. If you need help quitting, ask your doctor.  Wear comfortable shoes and clothing. What happens during the procedure?  You will be hooked up to a TV screen. Your doctor will watch the screen to see how fast your heart beats during the test.  Before you exercise, a computer will make a picture of your heart. To do this: ? A gel will be put on your chest. ? A wand will be moved over the gel. ? Sound waves from the wand will go to the computer to make the picture.  Your will start walking on a treadmill. The treadmill will start at a slow speed. It will get faster a little bit at a time. When you walk faster, your heart will beat faster.  The treadmill will be stopped when your heart is working hard.  You will lie down right away so another picture of your heart  can be taken.  The test will take 30-60 minutes. What happens after the procedure?  Your heart rate and blood pressure will be watched after the test.  If your doctor says that you can, you may: ? Eat what you usually eat. ? Do your normal activities. ? Take medicines like normal. Summary  An exercise stress echocardiogram is a test that checks how well your heart is working.  Follow instructions about what you cannot eat or drink before the test. Ask your doctor if you should take your normal medicines before the test.  Stop having caffeine for 24 hours before the test. Do not use anything with nicotine or tobacco in it for 4 hours before the test.  A computer will take a picture of your heart before you walk on a treadmill. It will take another picture when you are done walking.  Your heart rate and blood pressure will be watched after the test. This information is not intended to replace advice given to you by  your health care provider. Make sure you discuss any questions you have with your health care provider. Document Released: 03/20/2009 Document Revised: 02/14/2016 Document Reviewed: 02/14/2016 Elsevier Interactive Patient Education  2017 [employer].

## 2018-01-03 NOTE — Progress Notes (Signed)
New Outpatient Visit Date: 01/03/2018  Referring Provider: Lucille Passy, MD [place], [place] [address]  Chief Complaint: Chest pain  HPI:  Elizabeth Fleming is a 52 y.o. female who is being seen today for the evaluation of palpitations and elevated blood pressure at the request of Dr. Deborra Medina. She has a history of hypertension, prediabetes, and depression. Today, Elizabeth Fleming is most concerned about intermittent episodes of chest tightness that began ~6 months ago.  She feels a tight sensation across her chest that comes on randomly and lasts ~10 seconds with a maximal intensity of 7/10.  It is not exertional and does not radiate.  At times, it feels like her "heart stops" during the episodes.  There are no other associated symptoms.  The pain seems to be happening more frequently, now several times per week.  Elizabeth Fleming reports intermittent dyspnea on exertion.  It is not consistent, as she is able to climb stairs without any difficulty some days and finds herself needing to pull herself up the stairs by the handrails at other times.  She sometimes notes dizziness and a racing heart beat during these events.  She denies orthopnea and PND but notes mild dependent edema when seated for extended periods.  Elizabeth Fleming is adopted by recently found out that her mother had several strokes beginning in her late 63's.  Her half-sister also has a history of CAD and underwent CABG in her late 60's.  Elizabeth Fleming denies a personal history of heart disease or prior cardiac testing.  She consumes 3 cans of caffeinated soda/day.  She reports having gained ~80 pounds over the last 5 years, though her weight has been fairly stable over the last year.  --------------------------------------------------------------------------------------------------  Cardiovascular History & Procedures: Cardiovascular Problems:  Atypical chest pain  Palpitations  Shortness of breath  Risk  Factors:  Hypertension, prediabetes, family history, and morbid obesity  Cath/PCI:  None  CV Surgery:  None  EP Procedures and Devices:  None  Non-Invasive Evaluation(s):  None  Recent CV Pertinent Labs: Lab Results  Component Value Date   CHOL 182 11/22/2017   HDL 46 11/22/2017   LDLCALC 96 11/22/2017   LDLDIRECT 172.0 09/13/2017   TRIG 199 (H) 11/22/2017   CHOLHDL 5 09/13/2017   K 4.8 11/22/2017   K 4.1 09/25/2014   BUN 13 12/06/2017   BUN 12 09/25/2014   CREATININE 0.85 12/06/2017   CREATININE 0.81 09/25/2014    --------------------------------------------------------------------------------------------------  Past Medical History:  Diagnosis Date  . Allergy   . Colon polyps 2012  . Depression   . Genital warts   . Hyperlipidemia   . Hypertension   . Migraines   . Prediabetes     Past Surgical History:  Procedure Laterality Date  . CHOLECYSTECTOMY    . COLONOSCOPY WITH PROPOFOL N/A 11/03/2017   Procedure: COLONOSCOPY WITH PROPOFOL;  Surgeon: Virgel Manifold, MD;  Location: [facility] ENDOSCOPY;  Service: Endoscopy;  Laterality: N/A;  . TONSILLECTOMY  1974    Current Meds  Medication Sig  . albuterol (PROVENTIL HFA;VENTOLIN HFA) 108 (90 Base) MCG/ACT inhaler Inhale 2 puffs into the lungs every 6 (six) hours as needed.  Marland Kitchen aspirin 81 MG tablet Take 1 tablet (81 mg total) by mouth daily.  Marland Kitchen atenolol (TENORMIN) 50 MG tablet Take 1qd  . atorvastatin (LIPITOR) 20 MG tablet Take 1 tablet (20 mg total) by mouth daily.  Marland Kitchen buPROPion (WELLBUTRIN XL) 150 MG 24 hr tablet Take 1 tablet (150  mg total) by mouth daily.  . cetirizine (ZYRTEC) 10 MG tablet Take 1 tablet (10 mg total) by mouth daily.  . fish oil-omega-3 fatty acids 1000 MG capsule Take 1 g by mouth daily.    . fluticasone (FLONASE) 50 MCG/ACT nasal spray Place 2 sprays into both nostrils daily.  . metFORMIN (GLUCOPHAGE) 500 MG tablet TAKE 1 TABLET BY MOUTH TWICE DAILY WITH A MEAL.    Allergies:  Shellfish allergy  Social History   Tobacco Use  . Smoking status: Former Smoker    Packs/day: 1.00    Years: 15.00    Pack years: 15.00    Types: Cigarettes    Last attempt to quit: 05/2007    Years since quitting: 10.6  . Smokeless tobacco: Never Used  Substance Use Topics  . Alcohol use: No    Alcohol/week: 0.0 oz  . Drug use: No    Family History  Adopted: Yes  Problem Relation Age of Onset  . Stroke Mother   . Coronary artery disease Sister 67  . Breast cancer Neg Hx     Review of Systems: A 12-system review of systems was performed and was negative except as noted in the HPI.  --------------------------------------------------------------------------------------------------  Physical Exam: BP 122/82 (BP Location: Right Arm, Patient Position: Sitting, Cuff Size: Large)   Pulse 62   Ht 5\' 5"  (1.651 m)   Wt 248 lb 4 oz (112.6 kg)   LMP 12/24/2017 (Exact Date)   BMI 41.31 kg/m   General:  NAD HEENT: No conjunctival pallor or scleral icterus. Moist mucous membranes. OP clear. Neck: Supple without lymphadenopathy, thyromegaly, JVD, or HJR, though evaluation is limited by body habitus.. No carotid bruit. Lungs: Normal work of breathing. Clear to auscultation bilaterally without wheezes or crackles. Heart: Regular rate and rhythm without murmurs, rubs, or gallops. Unable to assess PMI due to body habitus. Abd: Bowel sounds present. Soft, NT/ND.  Unable to assess HSM due to body habitus. Ext: No lower extremity edema. Radial, PT, and DP pulses are 2+ bilaterally Skin: Warm and dry without rash. Neuro: CNIII-XII intact. Strength and fine-touch sensation intact in upper and lower extremities bilaterally. Psych: Normal mood and affect.  EKG:  NSR without abnormalities.  Lab Results  Component Value Date   WBC 9.0 09/23/2016   HGB 13.7 09/23/2016   HCT 39.6 09/23/2016   MCV 85.6 09/23/2016   PLT 234 09/23/2016    Lab Results  Component Value Date   NA 140  11/22/2017   K 4.8 11/22/2017   CL 103 11/22/2017   CO2 23 11/22/2017   BUN 13 12/06/2017   CREATININE 0.85 12/06/2017   GLUCOSE 104 (H) 11/22/2017   ALT 35 (H) 11/22/2017    Lab Results  Component Value Date   CHOL 182 11/22/2017   HDL 46 11/22/2017   LDLCALC 96 11/22/2017   LDLDIRECT 172.0 09/13/2017   TRIG 199 (H) 11/22/2017   CHOLHDL 5 09/13/2017     --------------------------------------------------------------------------------------------------  ASSESSMENT AND PLAN: Atypical chest pain Chest pain has been present for ~6 months but seems to be gradually progressing in frequency.  Brief duration and inconsistent exertional association argue against angina.  However, tightness and multiple cardiac risk factors are a concern.  We have discussed further testing options, including exercise tolerance test (GXT), exercise stress echo, nuclear stress test (MPI), cardiac CTA, and cardiac catheterization, and have agreed to proceed with non-invasive testing first.  Ms. Kohls would like to do some research on her own and will  let us know which study she prefers.  I would favor CTA or exercise MPI.  Palpitations Rare skipped beats are most consistent with PAC's or PVC's.  I suspect rapid heart rate when climbing stairs represent sinus tachycardia related to exertion.  Ms. Zakarian should continue atenolol.  We will defer additional testing pending aforementioned ischemia evaluation.  Hypertension Blood pressure reasonable today, though diastolic pressure is slightly above goal.  No medication changes today.  Morbid obesity BMI > 40.  Weight loss encouraged.  Exercise program to be deferred pending ischemia evaluation.  Follow-up: Return to clinic in 3 months.  Nelva Bush, MD 01/03/2018 10:51 PM

## 2018-01-04 ENCOUNTER — Telehealth: Payer: Self-pay | Admitting: Internal Medicine

## 2018-01-04 ENCOUNTER — Encounter: Payer: Self-pay | Admitting: *Deleted

## 2018-01-04 DIAGNOSIS — R079 Chest pain, unspecified: Secondary | ICD-10-CM

## 2018-01-04 DIAGNOSIS — Z01810 Encounter for preprocedural cardiovascular examination: Secondary | ICD-10-CM

## 2018-01-04 NOTE — Telephone Encounter (Signed)
Patient returning call from [facility].  She is aware the [place] office will call to schedule testing there.

## 2018-01-04 NOTE — Telephone Encounter (Signed)
S/w patient. She would like to have the Cardiac CT as explained to her by Dr End yesterday. She is aware is may take a few weeks until the CT is scheduled and she is called. She verbalized understanding of the preprocedural instructions and letter with instructions sent to patient's MyChart.

## 2018-01-04 NOTE — Telephone Encounter (Signed)
Pt calling stating she would like to schedule Cardiac CT   Please advise on how she can get this done

## 2018-01-04 NOTE — Addendum Note (Signed)
Addended by: Janan Ridge on: 01/04/2018 03:07 PM   Modules accepted: Orders

## 2018-01-04 NOTE — Telephone Encounter (Signed)
Left voicemail message for patient to call back.

## 2018-01-06 DIAGNOSIS — M79671 Pain in right foot: Secondary | ICD-10-CM | POA: Diagnosis not present

## 2018-01-11 DIAGNOSIS — H169 Unspecified keratitis: Secondary | ICD-10-CM | POA: Diagnosis not present

## 2018-01-22 ENCOUNTER — Ambulatory Visit (INDEPENDENT_AMBULATORY_CARE_PROVIDER_SITE_OTHER): Payer: BLUE CROSS/BLUE SHIELD | Admitting: Podiatry

## 2018-01-22 ENCOUNTER — Encounter: Payer: Self-pay | Admitting: Podiatry

## 2018-01-22 ENCOUNTER — Ambulatory Visit (INDEPENDENT_AMBULATORY_CARE_PROVIDER_SITE_OTHER): Payer: BLUE CROSS/BLUE SHIELD

## 2018-01-22 DIAGNOSIS — M779 Enthesopathy, unspecified: Secondary | ICD-10-CM | POA: Diagnosis not present

## 2018-01-22 DIAGNOSIS — S99921A Unspecified injury of right foot, initial encounter: Secondary | ICD-10-CM | POA: Diagnosis not present

## 2018-01-24 NOTE — Progress Notes (Signed)
Subjective:   Patient ID: Elizabeth Fleming, female   DOB: 52 y.o.   MRN: [id number]   HPI Patient presents concerned because she traumatized her right second toe and she is worried that this is can require surgery again   ROS      Objective:  Physical Exam  Neurovascular status intact with patient's right second toe showing mild inflammation pain at the distal interphalangeal joint but good alignment of the MPJ secondary to surgery we have done several years ago     Assessment:  Neurovascular status intact with patient found to have traumatized right second toe right second MPJ joint     Plan:  H&P condition reviewed and I recommended anti-inflammatories rigid bottom shoes and ice therapy and that should heal uneventfully and will be seen back if symptoms persist over the next 4 to 6 weeks  X-rays indicate that the osteotomy has healed well but there is no indication of fracture or pathology of the MPJ currently

## 2018-03-09 ENCOUNTER — Other Ambulatory Visit
Admission: RE | Admit: 2018-03-09 | Discharge: 2018-03-09 | Disposition: A | Discharge status: Home / Self Care | Payer: BLUE CROSS/BLUE SHIELD | Source: Ambulatory Visit | Attending: Internal Medicine | Admitting: Internal Medicine

## 2018-03-09 DIAGNOSIS — Z01810 Encounter for preprocedural cardiovascular examination: Secondary | ICD-10-CM | POA: Insufficient documentation

## 2018-03-09 DIAGNOSIS — R079 Chest pain, unspecified: Secondary | ICD-10-CM | POA: Insufficient documentation

## 2018-03-09 LAB — BASIC METABOLIC PANEL
Anion gap: 7 (ref 5–15)
BUN: 16 mg/dL (ref 6–20)
CHLORIDE: 103 mmol/L (ref 98–111)
CO2: 28 mmol/L (ref 22–32)
CREATININE: 1.03 mg/dL — AB (ref 0.44–1.00)
Calcium: 9.1 mg/dL (ref 8.9–10.3)
GFR calc non Af Amer: >60 mL/min (ref >60)
GLUCOSE: 96 mg/dL (ref 70–99)
Potassium: 4.3 mmol/L (ref 3.5–5.1)
Sodium: 138 mmol/L (ref 135–145)

## 2018-03-30 ENCOUNTER — Ambulatory Visit (HOSPITAL_COMMUNITY)
Admission: RE | Admit: 2018-03-30 | Discharge: 2018-03-30 | Disposition: A | Discharge status: Home / Self Care | Payer: BLUE CROSS/BLUE SHIELD | Source: Ambulatory Visit | Attending: Internal Medicine | Admitting: Internal Medicine

## 2018-03-30 DIAGNOSIS — I2510 Atherosclerotic heart disease of native coronary artery without angina pectoris: Secondary | ICD-10-CM | POA: Diagnosis not present

## 2018-03-30 DIAGNOSIS — Z01810 Encounter for preprocedural cardiovascular examination: Secondary | ICD-10-CM | POA: Diagnosis not present

## 2018-03-30 DIAGNOSIS — R079 Chest pain, unspecified: Secondary | ICD-10-CM | POA: Diagnosis not present

## 2018-03-30 MED ORDER — NITROGLYCERIN 0.4 MG SL SUBL
SUBLINGUAL_TABLET | SUBLINGUAL | Status: AC
  Filled 2018-03-30: qty 1

## 2018-03-30 MED ORDER — NITROGLYCERIN 0.4 MG SL SUBL
0.8000 mg | SUBLINGUAL_TABLET | Freq: Once | SUBLINGUAL | Status: AC
  Administered 2018-03-30: 0.8 mg via SUBLINGUAL

## 2018-03-30 MED ORDER — METOPROLOL TARTRATE 5 MG/5ML IV SOLN
10.0000 mg | INTRAVENOUS | Status: DC | PRN
  Administered 2018-03-30 (×2): 10 mg via INTRAVENOUS

## 2018-03-30 MED ORDER — METOPROLOL TARTRATE 5 MG/5ML IV SOLN
INTRAVENOUS | Status: AC
  Filled 2018-03-30: qty 10

## 2018-03-30 MED ORDER — IOPAMIDOL (ISOVUE-370) INJECTION 76%
100.0000 mL | Freq: Once | INTRAVENOUS | Status: AC | PRN
  Administered 2018-03-30: 100 mL via INTRAVENOUS

## 2018-03-30 NOTE — Progress Notes (Signed)
Patient ambulatory out of department with steady gati noted alone. Denies any complaint.s

## 2018-03-30 NOTE — Progress Notes (Signed)
CT complete. Patient denies any complaints. Offered patient drink and snack patient states she is going to get food after.

## 2018-04-03 ENCOUNTER — Ambulatory Visit (HOSPITAL_COMMUNITY)
Admission: RE | Admit: 2018-04-03 | Discharge: 2018-04-03 | Disposition: A | Discharge status: Home / Self Care | Payer: BLUE CROSS/BLUE SHIELD | Source: Ambulatory Visit | Attending: Internal Medicine | Admitting: Internal Medicine

## 2018-04-03 DIAGNOSIS — R079 Chest pain, unspecified: Secondary | ICD-10-CM

## 2018-04-03 DIAGNOSIS — Z01810 Encounter for preprocedural cardiovascular examination: Secondary | ICD-10-CM | POA: Insufficient documentation

## 2018-04-04 ENCOUNTER — Telehealth: Payer: Self-pay | Admitting: *Deleted

## 2018-04-04 NOTE — Telephone Encounter (Signed)
Patient calling back. She verbalized understanding of results. She is not available to come in for an appointment until Monday, 04/09/18. Scheduled Monday to see Dr End she was appreciative.

## 2018-04-04 NOTE — Telephone Encounter (Signed)
-----   Message from Elizabeth Bush, Elizabeth Fleming sent at 04/03/2018  3:06 PM EDT ----- Please let 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  Please have her see me or an APP as soon as possible to reassess her symptoms and discuss optimization of medical therapy versus catheterization.  She should continue her current medications.

## 2018-04-04 NOTE — Telephone Encounter (Signed)
No answer. Left message to call back.   

## 2018-04-09 ENCOUNTER — Encounter: Payer: Self-pay | Admitting: Internal Medicine

## 2018-04-09 ENCOUNTER — Ambulatory Visit (INDEPENDENT_AMBULATORY_CARE_PROVIDER_SITE_OTHER): Payer: BLUE CROSS/BLUE SHIELD | Admitting: Internal Medicine

## 2018-04-09 ENCOUNTER — Encounter: Payer: Self-pay | Admitting: *Deleted

## 2018-04-09 VITALS — BP 124/76 | HR 62 | Ht 65.0 in | Wt 249.5 lb

## 2018-04-09 DIAGNOSIS — I10 Essential (primary) hypertension: Secondary | ICD-10-CM | POA: Diagnosis not present

## 2018-04-09 DIAGNOSIS — E785 Hyperlipidemia, unspecified: Secondary | ICD-10-CM

## 2018-04-09 DIAGNOSIS — Z01810 Encounter for preprocedural cardiovascular examination: Secondary | ICD-10-CM | POA: Diagnosis not present

## 2018-04-09 DIAGNOSIS — I200 Unstable angina: Secondary | ICD-10-CM | POA: Diagnosis not present

## 2018-04-09 MED ORDER — NITROGLYCERIN 0.4 MG SL SUBL: 0.4000 mg | SUBLINGUAL_TABLET | SUBLINGUAL | 3 refills | Status: DC | PRN

## 2018-04-09 MED ORDER — ISOSORBIDE MONONITRATE ER 30 MG PO TB24: 15.0000 mg | ORAL_TABLET | Freq: Every day | ORAL | 3 refills | Status: DC

## 2018-04-09 NOTE — H&P (View-Only) (Signed)
Follow-up Outpatient Visit Date: 04/09/2018  Primary Care Provider: Lucille Passy, MD [place] [id number]  Chief Complaint: Dyspnea on exertion and chest pain  HPI:  Elizabeth Fleming is a 52 y.o. year-old female with history of hypertension, prediabetes, and depression, who presents for follow-up of chest pain and shortness of breath with abnormal cardiac CTA.  I met Elizabeth Fleming in late July, at which time she reported intermittent exertional dyspnea and chest discomfort.  We agreed to perform a cardiac CTA, which was completed just over a week ago.  This showed significant single-vessel disease with moderate to severe ostial RCA stenosis that was positive by CT FFR.  Today, Elizabeth Fleming reports that she has continued to have intermittent shortness of breath and chest pain, particularly when walking.  She also has brief spells at rest from time to time.  Typically, however, the chest tightness and dyspnea resolved after she rests for a few minutes.  She feels as though her symptoms have gradually worsened over the last 6 months.  She denies edema, orthopnea, and PND, as well as palpitations and lightheadedness.  --------------------------------------------------------------------------------------------------  Cardiovascular History & Procedures: Cardiovascular Problems:  Atypical chest pain  Palpitations  Shortness of breath  Risk Factors:  Hypertension, prediabetes, family history, and morbid obesity  Cath/PCI:  None  CV Surgery:  None  EP Procedures and Devices:  None  Non-Invasive Evaluation(s):  Cardiac CTA (03/30/18): No MCA normal.  LAD with mild proximal plaquing of less than 50%.  LCx with minimal luminal irregularities.  RCA with at least 50% ostial stenosis (CT FFR: proximal 0.74, mid 0.74, distal 0.66).  Coronary calcium score 0.  Small hiatal hernia noted.  Recent CV Pertinent Labs: Lab Results  Component Value Date   CHOL 182  11/22/2017   HDL 46 11/22/2017   LDLCALC 96 11/22/2017   LDLDIRECT 172.0 09/13/2017   TRIG 199 (H) 11/22/2017   CHOLHDL 5 09/13/2017   K 4.3 03/09/2018   K 4.1 09/25/2014   BUN 16 03/09/2018   BUN 13 12/06/2017   BUN 12 09/25/2014   CREATININE 1.03 (H) 03/09/2018   CREATININE 0.81 09/25/2014    Past medical and surgical history were reviewed and updated in EPIC.  Current Meds  Medication Sig  . albuterol (PROVENTIL HFA;VENTOLIN HFA) 108 (90 Base) MCG/ACT inhaler Inhale 2 puffs into the lungs every 6 (six) hours as needed.  Marland Kitchen aspirin 81 MG tablet Take 1 tablet (81 mg total) by mouth daily.  Marland Kitchen atenolol (TENORMIN) 50 MG tablet Take 1qd  . atorvastatin (LIPITOR) 20 MG tablet Take 1 tablet (20 mg total) by mouth daily.  Marland Kitchen buPROPion (WELLBUTRIN XL) 150 MG 24 hr tablet Take 1 tablet (150 mg total) by mouth daily.  . cetirizine (ZYRTEC) 10 MG tablet Take 1 tablet (10 mg total) by mouth daily.  . fish oil-omega-3 fatty acids 1000 MG capsule Take 1 g by mouth daily.    . fluticasone (FLONASE) 50 MCG/ACT nasal spray Place 2 sprays into both nostrils daily.  . metFORMIN (GLUCOPHAGE) 500 MG tablet TAKE 1 TABLET BY MOUTH TWICE DAILY WITH A MEAL.    Allergies: Shellfish allergy  Social History   Tobacco Use  . Smoking status: Former Smoker    Packs/day: 1.00    Years: 15.00    Pack years: 15.00    Types: Cigarettes    Last attempt to quit: 05/2007    Years since quitting: 10.9  . Smokeless tobacco: Never Used  Substance Use Topics  . Alcohol use: No    Alcohol/week: 0.0 standard drinks  . Drug use: No    Family History  Adopted: Yes  Problem Relation Age of Onset  . Stroke Mother   . Coronary artery disease Sister 73  . Breast cancer Neg Hx     Review of Systems: A 12-system review of systems was performed and was negative except as noted in the HPI.  --------------------------------------------------------------------------------------------------  Physical Exam: BP  124/76 (BP Location: Left Arm, Patient Position: Sitting, Cuff Size: Normal)   Pulse 62   Ht 5' 5"  (1.651 m)   Wt 249 lb 8 oz (113.2 kg)   BMI 41.52 kg/m   General: NAD. HEENT: No conjunctival pallor or scleral icterus. Moist mucous membranes.  OP clear. Neck: Supple without lymphadenopathy, thyromegaly, JVD, or HJR. Lungs: Normal work of breathing. Clear to auscultation bilaterally without wheezes or crackles. Heart: Regular rate and rhythm without murmurs, rubs, or gallops. Non-displaced PMI. Abd: Bowel sounds present. Soft, NT/ND without hepatosplenomegaly Ext: No lower extremity edema. Radial, PT, and DP pulses are 2+ bilaterally. Skin: Warm and dry without rash.  EKG: Normal sinus rhythm without abnormalities  Lab Results  Component Value Date   WBC 9.0 09/23/2016   HGB 13.7 09/23/2016   HCT 39.6 09/23/2016   MCV 85.6 09/23/2016   PLT 234 09/23/2016    Lab Results  Component Value Date   NA 138 03/09/2018   K 4.3 03/09/2018   CL 103 03/09/2018   CO2 28 03/09/2018   BUN 16 03/09/2018   CREATININE 1.03 (H) 03/09/2018   GLUCOSE 96 03/09/2018   ALT 35 (H) 11/22/2017    Lab Results  Component Value Date   CHOL 182 11/22/2017   HDL 46 11/22/2017   LDLCALC 96 11/22/2017   LDLDIRECT 172.0 09/13/2017   TRIG 199 (H) 11/22/2017   CHOLHDL 5 09/13/2017    --------------------------------------------------------------------------------------------------  ASSESSMENT AND PLAN: Accelerating angina Ms. Even continues to have exertional dyspnea and chest discomfort as well as sporadic episodes at rest consistent with CCS class III-IV angina.  Cardiac CTA last month showed significant ostial RCA disease.  We discussed escalation of medical therapy versus cardiac catheterization and possible PCI.  Given progression of her symptoms, Elizabeth Fleming would like to proceed with catheterization.  I have reviewed the risks, indications, and alternatives to cardiac catheterization,  possible angioplasty, and stenting with the patient. Risks include but are not limited to bleeding, infection, vascular injury, stroke, myocardial infection, arrhythmia, kidney injury, radiation-related injury in the case of prolonged fluoroscopy use, emergency cardiac surgery, and death. The patient understands the risks of serious complication is 1-2 in 8676 with diagnostic cardiac cath and 1-2% or less with angioplasty/stenting.  In the meantime, we will initiate isosorbide mononitrate 15 mg daily and continue her current dose of atenolol.  I have also provided her with a prescription for sublingual nitroglycerin.  If she has worsening symptoms prior to the catheterization, Ms. Keough was advised to seek immediate medical attention.  Hyperlipidemia Most recent lipid panel in June revealed an LDL of 96.  I will recheck a fasting lipid panel at the patient's convenience, with plans to escalate atorvastatin if her LDL remains above 70.  Hypertension Blood pressure well controlled today.  As above, we will start isosorbide mononitrate 15 mg daily.  No other changes at this time.  Follow-up: Return to clinic in 4 to 6 weeks.  Nelva Bush, MD 04/10/2018 6:44 AM

## 2018-04-09 NOTE — Progress Notes (Signed)
Follow-up Outpatient Visit Date: 04/09/2018  Primary Care Provider: Lucille Passy, MD [place] [address]  Chief Complaint: Dyspnea on exertion and chest pain  HPI:  Elizabeth Fleming is a 52 y.o. year-old female with history of hypertension, prediabetes, and depression, who presents for follow-up of chest pain and shortness of breath with abnormal cardiac CTA.  I met Elizabeth Fleming in late July, at which time she reported intermittent exertional dyspnea and chest discomfort.  We agreed to perform a cardiac CTA, which was completed just over a week ago.  This showed significant single-vessel disease with moderate to severe ostial RCA stenosis that was positive by CT FFR.  Today, Elizabeth Fleming reports that she has continued to have intermittent shortness of breath and chest pain, particularly when walking.  She also has brief spells at rest from time to time.  Typically, however, the chest tightness and dyspnea resolved after she rests for a few minutes.  She feels as though her symptoms have gradually worsened over the last 6 months.  She denies edema, orthopnea, and PND, as well as palpitations and lightheadedness.  --------------------------------------------------------------------------------------------------  Cardiovascular History & Procedures: Cardiovascular Problems:  Atypical chest pain  Palpitations  Shortness of breath  Risk Factors:  Hypertension, prediabetes, family history, and morbid obesity  Cath/PCI:  None  CV Surgery:  None  EP Procedures and Devices:  None  Non-Invasive Evaluation(s):  Cardiac CTA (03/30/18): No MCA normal.  LAD with mild proximal plaquing of less than 50%.  LCx with minimal luminal irregularities.  RCA with at least 50% ostial stenosis (CT FFR: proximal 0.74, mid 0.74, distal 0.66).  Coronary calcium score 0.  Small hiatal hernia noted.  Recent CV Pertinent Labs: Lab Results  Component Value Date   CHOL 182  11/22/2017   HDL 46 11/22/2017   LDLCALC 96 11/22/2017   LDLDIRECT 172.0 09/13/2017   TRIG 199 (H) 11/22/2017   CHOLHDL 5 09/13/2017   K 4.3 03/09/2018   K 4.1 09/25/2014   BUN 16 03/09/2018   BUN 13 12/06/2017   BUN 12 09/25/2014   CREATININE 1.03 (H) 03/09/2018   CREATININE 0.81 09/25/2014    Past medical and surgical history were reviewed and updated in EPIC.  Current Meds  Medication Sig  . albuterol (PROVENTIL HFA;VENTOLIN HFA) 108 (90 Base) MCG/ACT inhaler Inhale 2 puffs into the lungs every 6 (six) hours as needed.  Marland Kitchen aspirin 81 MG tablet Take 1 tablet (81 mg total) by mouth daily.  Marland Kitchen atenolol (TENORMIN) 50 MG tablet Take 1qd  . atorvastatin (LIPITOR) 20 MG tablet Take 1 tablet (20 mg total) by mouth daily.  Marland Kitchen buPROPion (WELLBUTRIN XL) 150 MG 24 hr tablet Take 1 tablet (150 mg total) by mouth daily.  . cetirizine (ZYRTEC) 10 MG tablet Take 1 tablet (10 mg total) by mouth daily.  . fish oil-omega-3 fatty acids 1000 MG capsule Take 1 g by mouth daily.    . fluticasone (FLONASE) 50 MCG/ACT nasal spray Place 2 sprays into both nostrils daily.  . metFORMIN (GLUCOPHAGE) 500 MG tablet TAKE 1 TABLET BY MOUTH TWICE DAILY WITH A MEAL.    Allergies: Shellfish allergy  Social History   Tobacco Use  . Smoking status: Former Smoker    Packs/day: 1.00    Years: 15.00    Pack years: 15.00    Types: Cigarettes    Last attempt to quit: 05/2007    Years since quitting: 10.9  . Smokeless tobacco: Never Used  Substance Use Topics  . Alcohol use: No    Alcohol/week: 0.0 standard drinks  . Drug use: No    Family History  Adopted: Yes  Problem Relation Age of Onset  . Stroke Mother   . Coronary artery disease Sister 71  . Breast cancer Neg Hx     Review of Systems: A 12-system review of systems was performed and was negative except as noted in the HPI.  --------------------------------------------------------------------------------------------------  Physical Exam: BP  124/76 (BP Location: Left Arm, Patient Position: Sitting, Cuff Size: Normal)   Pulse 62   Ht 5' 5"  (1.651 m)   Wt 249 lb 8 oz (113.2 kg)   BMI 41.52 kg/m   General: NAD. HEENT: No conjunctival pallor or scleral icterus. Moist mucous membranes.  OP clear. Neck: Supple without lymphadenopathy, thyromegaly, JVD, or HJR. Lungs: Normal work of breathing. Clear to auscultation bilaterally without wheezes or crackles. Heart: Regular rate and rhythm without murmurs, rubs, or gallops. Non-displaced PMI. Abd: Bowel sounds present. Soft, NT/ND without hepatosplenomegaly Ext: No lower extremity edema. Radial, PT, and DP pulses are 2+ bilaterally. Skin: Warm and dry without rash.  EKG: Normal sinus rhythm without abnormalities  Lab Results  Component Value Date   WBC 9.0 09/23/2016   HGB 13.7 09/23/2016   HCT 39.6 09/23/2016   MCV 85.6 09/23/2016   PLT 234 09/23/2016    Lab Results  Component Value Date   NA 138 03/09/2018   K 4.3 03/09/2018   CL 103 03/09/2018   CO2 28 03/09/2018   BUN 16 03/09/2018   CREATININE 1.03 (H) 03/09/2018   GLUCOSE 96 03/09/2018   ALT 35 (H) 11/22/2017    Lab Results  Component Value Date   CHOL 182 11/22/2017   HDL 46 11/22/2017   LDLCALC 96 11/22/2017   LDLDIRECT 172.0 09/13/2017   TRIG 199 (H) 11/22/2017   CHOLHDL 5 09/13/2017    --------------------------------------------------------------------------------------------------  ASSESSMENT AND PLAN: Accelerating angina Elizabeth Fleming continues to have exertional dyspnea and chest discomfort as well as sporadic episodes at rest consistent with CCS class III-IV angina.  Cardiac CTA last month showed significant ostial RCA disease.  We discussed escalation of medical therapy versus cardiac catheterization and possible PCI.  Given progression of her symptoms, Elizabeth Fleming would like to proceed with catheterization.  I have reviewed the risks, indications, and alternatives to cardiac catheterization,  possible angioplasty, and stenting with the patient. Risks include but are not limited to bleeding, infection, vascular injury, stroke, myocardial infection, arrhythmia, kidney injury, radiation-related injury in the case of prolonged fluoroscopy use, emergency cardiac surgery, and death. The patient understands the risks of serious complication is 1-2 in 8206 with diagnostic cardiac cath and 1-2% or less with angioplasty/stenting.  In the meantime, we will initiate isosorbide mononitrate 15 mg daily and continue her current dose of atenolol.  I have also provided her with a prescription for sublingual nitroglycerin.  If she has worsening symptoms prior to the catheterization, Elizabeth Fleming was advised to seek immediate medical attention.  Hyperlipidemia Most recent lipid panel in June revealed an LDL of 96.  I will recheck a fasting lipid panel at the patient's convenience, with plans to escalate atorvastatin if her LDL remains above 70.  Hypertension Blood pressure well controlled today.  As above, we will start isosorbide mononitrate 15 mg daily.  No other changes at this time.  Follow-up: Return to clinic in 4 to 6 weeks.  Nelva Bush, MD 04/10/2018 6:44 AM

## 2018-04-09 NOTE — Patient Instructions (Signed)
Medication Instructions:  Your physician has recommended you make the following change in your medication:  1- START Imdur 15 mg (0.5 tablet) by mouth once a day. 2- NITROGLYCERIN 0.4 MG TABLET - Dissolve one tablet under your tongue every 5 minutes AS NEEDED FOR CHEST PAIN.  Do not take more than 3 doses. If you take 3 doses and chest pain does not go away, call 911 or go to the Emergency room.  If you need a refill on your cardiac medications before your next appointment, please call your pharmacy.   Lab work: Your physician recommends that you return for lab work in: sometime this week.  - LIPID, CMET, CBC. - You will need to be FASTING. DO NOT EAT OR DRINK after midnight the morning of lab work. Please go to the [facility]. You will check in at the front desk to the right as you walk into the atrium. Valet Parking is offered if needed.   If you have labs (blood work) drawn today and your tests are completely normal, you will receive your results only by: Marland Kitchen MyChart Message (if you have MyChart) OR . A paper copy in the mail If you have any lab test that is abnormal or we need to change your treatment, we will call you to review the results.  Testing/Procedures: Your physician has requested that you have a cardiac catheterization. Cardiac catheterization is used to diagnose and/or treat various heart conditions. Doctors may recommend this procedure for a number of different reasons. The most common reason is to evaluate chest pain. Chest pain can be a symptom of coronary artery disease (CAD), and cardiac catheterization can show whether plaque is narrowing or blocking your heart's arteries. This procedure is also used to evaluate the valves, as well as measure the blood flow and oxygen levels in different parts of your heart. For further information please visit [email or web address][email or web address]. Please follow instruction sheet, as given.   You are scheduled for a Cardiac Catheterization on  Thursday, November 21 with Dr. Harrell Gave End.  1. Please arrive at the [facility] (Main Entrance A) at [facility]: [place], [place] [address] at 5:30 AM (This time is two hours before your procedure to ensure your preparation). Free valet parking service is available.   Special note: Every effort is made to have your procedure done on time. Please understand that emergencies sometimes delay scheduled procedures.  2. Diet: Do not eat solid foods after midnight. You may have clear liquids until 5am on the day of the procedure.  3. Labs: You will need to have blood drawn WEEK OF 04/09/18 at  [facility] Entrance, Go to 1st desk on your right to register.  Address: [address] [address], [place] [address]  Open: 8am - 5pm  Phone: [phone]. You do not need to be fasting.  4. Medication instructions in preparation for your procedure:  Contrast Allergy: No  DO NOT TAKE ANY ORAL DIABETES MEDICATIONS THE MORNING OF THE PROCEDURE.  Do not take Diabetes Med Glucophage (Metformin) on the day of the procedure and HOLD 48 HOURS AFTER THE PROCEDURE.    On the morning of your procedure, take your Aspirin and any morning medicines NOT listed above.  You may use sips of water.   5. Plan for one night stay--bring personal belongings. 6. Bring a current list of your medications and current insurance cards. 7. You MUST have a responsible person to drive you home. 8. Someone MUST  be with you the first 24 hours after you arrive home or your discharge will be delayed. 9. Please wear clothes that are easy to get on and off and wear slip-on shoes.    Follow-Up: At [facility], you and your health needs are our priority.  As part of our continuing mission to provide you with exceptional heart care, we have created designated Provider Care Teams.  These Care Teams include your primary Cardiologist (physician) and Advanced Practice Providers (APPs -  Physician  Assistants and Nurse Practitioners) who all work together to provide you with the care you need, when you need it. You will need a follow up appointment in 2-4 weeks after the heart cath.    You may see DR Harrell Gave END or one of the following Advanced Practice Providers on your designated Care Team:   Murray Hodgkins, NP Christell Faith, PA-C . Marrianne Mood, PA-C     Coronary Angiogram With Stent Coronary angiogram with stent placement is a procedure to widen or open a narrow blood vessel of the heart (coronary artery). Arteries may become blocked by cholesterol buildup (plaques) in the lining of the wall. When a coronary artery becomes partially blocked, blood flow to that area decreases. This may lead to chest pain or a heart attack (myocardial infarction). A stent is a small piece of metal that looks like mesh or a spring. Stent placement may be done as treatment for a heart attack or right after a coronary angiogram in which a blocked artery is found. Let your health care provider know about:  Any allergies you have.  All medicines you are taking, including vitamins, herbs, eye drops, creams, and over-the-counter medicines.  Any problems you or family members have had with anesthetic medicines.  Any blood disorders you have.  Any surgeries you have had.  Any medical conditions you have.  Whether you are pregnant or may be pregnant. What are the risks? Generally, this is a safe procedure. However, problems may occur, including:  Damage to the heart or its blood vessels.  A return of blockage.  Bleeding, infection, or bruising at the insertion site.  A collection of blood under the skin (hematoma) at the insertion site.  A blood clot in another part of the body.  Kidney injury.  Allergic reaction to the dye or contrast that is used.  Bleeding into the abdomen (retroperitoneal bleeding).  What happens before the procedure? Staying hydrated Follow instructions from  your health care provider about hydration, which may include:  Up to 2 hours before the procedure - you may continue to drink clear liquids, such as water, clear fruit juice, black coffee, and plain tea.  Eating and drinking restrictions Follow instructions from your health care provider about eating and drinking, which may include:  8 hours before the procedure - stop eating heavy meals or foods such as meat, fried foods, or fatty foods.  6 hours before the procedure - stop eating light meals or foods, such as toast or cereal.  2 hours before the procedure - stop drinking clear liquids.  Ask your health care provider about:  Changing or stopping your regular medicines. This is especially important if you are taking diabetes medicines or blood thinners.  Taking medicines such as ibuprofen. These medicines can thin your blood. Do not take these medicines before your procedure if your health care provider instructs you not to. Generally, aspirin is recommended before a procedure of passing a small, thin tube (catheter) through a blood  vessel and into the heart (cardiac catheterization).  What happens during the procedure?  An IV tube will be inserted into one of your veins.  You will be given one or more of the following: ? A medicine to help you relax (sedative). ? A medicine to numb the area where the catheter will be inserted into an artery (local anesthetic).  To reduce your risk of infection: ? Your health care team will wash or sanitize their hands. ? Your skin will be washed with soap. ? Hair may be removed from the area where the catheter will be inserted.  Using a guide wire, the catheter will be inserted into an artery. The location may be in your groin, in your wrist, or in the fold of your arm (near your elbow).  A type of X-ray (fluoroscopy) will be used to help guide the catheter to the opening of the arteries in the heart.  A dye will be injected into the catheter,  and X-rays will be taken. The dye will help to show where any narrowing or blockages are located in the arteries.  A tiny wire will be guided to the blocked spot, and a balloon will be inflated to make the artery wider.  The stent will be expanded and will crush the plaques into the wall of the vessel. The stent will hold the area open and improve the blood flow. Most stents have a drug coating to reduce the risk of the stent narrowing over time.  The artery may be made wider using a drill, laser, or other tools to remove plaques.  When the blood flow is better, the catheter will be removed. The lining of the artery will grow over the stent, which stays where it was placed. This procedure may vary among health care providers and hospitals. What happens after the procedure?  If the procedure is done through the leg, you will be kept in bed lying flat for about 6 hours. You will be instructed to not bend and not cross your legs.  The insertion site will be checked frequently.  The pulse in your foot or wrist will be checked frequently.  You may have additional blood tests, X-rays, and a test that records the electrical activity of your heart (electrocardiogram, or ECG). This information is not intended to replace advice given to you by your health care provider. Make sure you discuss any questions you have with your health care provider. Document Released: 11/27/2002 Document Revised: 01/21/2016 Document Reviewed: 12/27/2015 Elsevier Interactive Patient Education  [employer].

## 2018-04-10 ENCOUNTER — Encounter: Payer: Self-pay | Admitting: Internal Medicine

## 2018-04-10 DIAGNOSIS — I200 Unstable angina: Secondary | ICD-10-CM | POA: Insufficient documentation

## 2018-04-10 NOTE — Addendum Note (Signed)
Addended by: Alba Destine on: 04/10/2018 08:40 AM   Modules accepted: Orders

## 2018-04-17 ENCOUNTER — Encounter: Payer: Self-pay | Admitting: Family Medicine

## 2018-04-17 ENCOUNTER — Other Ambulatory Visit
Admission: RE | Admit: 2018-04-17 | Discharge: 2018-04-17 | Disposition: A | Discharge status: Home / Self Care | Payer: BLUE CROSS/BLUE SHIELD | Source: Ambulatory Visit | Attending: Internal Medicine | Admitting: Internal Medicine

## 2018-04-17 DIAGNOSIS — Z01810 Encounter for preprocedural cardiovascular examination: Secondary | ICD-10-CM | POA: Insufficient documentation

## 2018-04-17 DIAGNOSIS — I200 Unstable angina: Secondary | ICD-10-CM

## 2018-04-17 DIAGNOSIS — E785 Hyperlipidemia, unspecified: Secondary | ICD-10-CM | POA: Insufficient documentation

## 2018-04-17 LAB — CBC WITH DIFFERENTIAL/PLATELET
ABS IMMATURE GRANULOCYTES: 0.03 10*3/uL (ref 0.00–0.07)
BASOS PCT: 1 %
Basophils Absolute: 0.1 10*3/uL (ref 0.0–0.1)
EOS PCT: 5 %
Eosinophils Absolute: 0.4 10*3/uL (ref 0.0–0.5)
HCT: 38.4 % (ref 36.0–46.0)
Hemoglobin: 12.3 g/dL (ref 12.0–15.0)
Immature Granulocytes: 0 %
Lymphocytes Relative: 23 %
Lymphs Abs: 2 10*3/uL (ref 0.7–4.0)
MCH: 28.1 pg (ref 26.0–34.0)
MCHC: 32 g/dL (ref 30.0–36.0)
MCV: 87.9 fL (ref 80.0–100.0)
MONO ABS: 0.6 10*3/uL (ref 0.1–1.0)
Monocytes Relative: 7 %
NEUTROS ABS: 5.6 10*3/uL (ref 1.7–7.7)
NRBC: 0 % (ref 0.0–0.2)
Neutrophils Relative %: 64 %
PLATELETS: 241 10*3/uL (ref 150–400)
RBC: 4.37 MIL/uL (ref 3.87–5.11)
RDW: 13.1 % (ref 11.5–15.5)
WBC: 8.7 10*3/uL (ref 4.0–10.5)

## 2018-04-17 LAB — LIPID PANEL
Cholesterol: 152 mg/dL (ref 0–200)
HDL: 47 mg/dL (ref >40)
LDL CALC: 81 mg/dL (ref 0–99)
Total CHOL/HDL Ratio: 3.2 RATIO
Triglycerides: 120 mg/dL (ref <150)
VLDL: 24 mg/dL (ref 0–40)

## 2018-04-17 LAB — COMPREHENSIVE METABOLIC PANEL
ALK PHOS: 102 U/L (ref 38–126)
ALT: 23 U/L (ref 0–44)
ANION GAP: 10 (ref 5–15)
AST: 23 U/L (ref 15–41)
Albumin: 3.8 g/dL (ref 3.5–5.0)
BUN: 13 mg/dL (ref 6–20)
CALCIUM: 9 mg/dL (ref 8.9–10.3)
CO2: 27 mmol/L (ref 22–32)
Chloride: 103 mmol/L (ref 98–111)
Creatinine, Ser: 0.74 mg/dL (ref 0.44–1.00)
GFR calc Af Amer: >60 mL/min (ref >60)
GFR calc non Af Amer: >60 mL/min (ref >60)
GLUCOSE: 111 mg/dL — AB (ref 70–99)
Potassium: 4.7 mmol/L (ref 3.5–5.1)
SODIUM: 140 mmol/L (ref 135–145)
Total Bilirubin: 1.4 mg/dL — ABNORMAL HIGH (ref 0.3–1.2)
Total Protein: 7.4 g/dL (ref 6.5–8.1)

## 2018-04-18 MED ORDER — FLUTICASONE PROPIONATE 50 MCG/ACT NA SUSP: 2.0000 | Freq: Every day | NASAL | 6 refills | Status: DC

## 2018-04-19 ENCOUNTER — Telehealth: Payer: Self-pay | Admitting: *Deleted

## 2018-04-19 DIAGNOSIS — Z79899 Other long term (current) drug therapy: Secondary | ICD-10-CM

## 2018-04-19 DIAGNOSIS — E785 Hyperlipidemia, unspecified: Secondary | ICD-10-CM

## 2018-04-19 MED ORDER — ATORVASTATIN CALCIUM 40 MG PO TABS: 40.0000 mg | ORAL_TABLET | Freq: Every day | ORAL | 3 refills | Status: DC

## 2018-04-19 MED ORDER — FLUTICASONE PROPIONATE 50 MCG/ACT NA SUSP: NASAL | 99 refills | Status: DC

## 2018-04-19 NOTE — Addendum Note (Signed)
Addended by: Verlene Mayer A on: 04/19/2018 08:42 AM   Modules accepted: Orders

## 2018-04-19 NOTE — Telephone Encounter (Signed)
No answer. Left message to call back.   

## 2018-04-19 NOTE — Telephone Encounter (Signed)
-----   Message from Nelva Bush, MD sent at 04/17/2018  7:07 PM EST ----- Please let the patient know that her renal function, liver function, and electrolytes are normal. Slight elevation in bilirubin is non-specific. LDL is above goal, given evidence of significant CAD on recent cardiac CTA. I recommend increasing atorvastatin to 40 mg daily with repeat lipid panel and ALT in 3 months. We will proceed with cath as planned.

## 2018-04-19 NOTE — Telephone Encounter (Signed)
Patient returning call. She verbalized understanding of results and to increase atorvastatin to 40 mg daily and to get repeat labs in 3 months. Rx sent to pharmacy. Lab orders entered.

## 2018-04-24 ENCOUNTER — Telehealth: Payer: Self-pay | Admitting: *Deleted

## 2018-04-24 NOTE — Telephone Encounter (Addendum)
Pt contacted pre-catheterization scheduled at [facility] for: Thursday April 26, 2018 7:30 AM Verified arrival time and place: [place] Entrance A at: 5:30 AM  No solid food after midnight prior to cath, clear liquids until 5 AM day of procedure. Contrast allergy: no- pt states she tolerated coronary CTA w/contrast.  Hold: Metformin-day of procedure and 48 hours post procedure.  Except hold medications AM meds can be  taken pre-cath with sip of water including: ASA 81 mg  Confirm patient has responsible person to drive home post procedure and for 24 hours after you arrive home.  LMTCB to discuss with patient.

## 2018-04-24 NOTE — Telephone Encounter (Signed)
No answer, voice mail 

## 2018-04-24 NOTE — Telephone Encounter (Signed)
Discussed instructions with patient, she verbalized understanding, thanked me for call.

## 2018-04-26 ENCOUNTER — Ambulatory Visit (HOSPITAL_COMMUNITY)
Admission: RE | Admit: 2018-04-26 | Discharge: 2018-04-26 | Disposition: A | Discharge status: Home / Self Care | Payer: BLUE CROSS/BLUE SHIELD | Source: Ambulatory Visit | Attending: Internal Medicine | Admitting: Internal Medicine

## 2018-04-26 ENCOUNTER — Encounter (HOSPITAL_COMMUNITY)
Admission: RE | Disposition: A | Discharge status: Home / Self Care | Payer: Self-pay | Source: Ambulatory Visit | Attending: Internal Medicine

## 2018-04-26 ENCOUNTER — Other Ambulatory Visit: Payer: Self-pay

## 2018-04-26 DIAGNOSIS — F329 Major depressive disorder, single episode, unspecified: Secondary | ICD-10-CM | POA: Insufficient documentation

## 2018-04-26 DIAGNOSIS — Z91013 Allergy to seafood: Secondary | ICD-10-CM | POA: Diagnosis not present

## 2018-04-26 DIAGNOSIS — Z7984 Long term (current) use of oral hypoglycemic drugs: Secondary | ICD-10-CM | POA: Diagnosis not present

## 2018-04-26 DIAGNOSIS — Z87891 Personal history of nicotine dependence: Secondary | ICD-10-CM | POA: Diagnosis not present

## 2018-04-26 DIAGNOSIS — Z8249 Family history of ischemic heart disease and other diseases of the circulatory system: Secondary | ICD-10-CM | POA: Diagnosis not present

## 2018-04-26 DIAGNOSIS — Z7982 Long term (current) use of aspirin: Secondary | ICD-10-CM | POA: Insufficient documentation

## 2018-04-26 DIAGNOSIS — Z823 Family history of stroke: Secondary | ICD-10-CM | POA: Diagnosis not present

## 2018-04-26 DIAGNOSIS — I10 Essential (primary) hypertension: Secondary | ICD-10-CM | POA: Diagnosis not present

## 2018-04-26 DIAGNOSIS — E785 Hyperlipidemia, unspecified: Secondary | ICD-10-CM | POA: Insufficient documentation

## 2018-04-26 DIAGNOSIS — R0609 Other forms of dyspnea: Secondary | ICD-10-CM | POA: Insufficient documentation

## 2018-04-26 DIAGNOSIS — Z7951 Long term (current) use of inhaled steroids: Secondary | ICD-10-CM | POA: Insufficient documentation

## 2018-04-26 DIAGNOSIS — R002 Palpitations: Secondary | ICD-10-CM | POA: Insufficient documentation

## 2018-04-26 DIAGNOSIS — R931 Abnormal findings on diagnostic imaging of heart and coronary circulation: Secondary | ICD-10-CM | POA: Diagnosis not present

## 2018-04-26 DIAGNOSIS — I200 Unstable angina: Secondary | ICD-10-CM | POA: Diagnosis present

## 2018-04-26 DIAGNOSIS — R7303 Prediabetes: Secondary | ICD-10-CM | POA: Insufficient documentation

## 2018-04-26 DIAGNOSIS — Z955 Presence of coronary angioplasty implant and graft: Secondary | ICD-10-CM

## 2018-04-26 DIAGNOSIS — I25119 Atherosclerotic heart disease of native coronary artery with unspecified angina pectoris: Secondary | ICD-10-CM | POA: Insufficient documentation

## 2018-04-26 DIAGNOSIS — I25110 Atherosclerotic heart disease of native coronary artery with unstable angina pectoris: Secondary | ICD-10-CM | POA: Diagnosis not present

## 2018-04-26 DIAGNOSIS — Z79899 Other long term (current) drug therapy: Secondary | ICD-10-CM | POA: Diagnosis not present

## 2018-04-26 HISTORY — PX: INTRAVASCULAR ULTRASOUND/IVUS: CATH118244

## 2018-04-26 HISTORY — PX: LEFT HEART CATH AND CORONARY ANGIOGRAPHY: CATH118249

## 2018-04-26 LAB — GLUCOSE, CAPILLARY
GLUCOSE-CAPILLARY: 73 mg/dL (ref 70–99)
Glucose-Capillary: 102 mg/dL — ABNORMAL HIGH (ref 70–99)

## 2018-04-26 LAB — POCT ACTIVATED CLOTTING TIME
Activated Clotting Time: 285 seconds
Activated Clotting Time: 285 seconds

## 2018-04-26 LAB — PREGNANCY, URINE: Preg Test, Ur: NEGATIVE

## 2018-04-26 SURGERY — LEFT HEART CATH AND CORONARY ANGIOGRAPHY
Anesthesia: LOCAL

## 2018-04-26 MED ORDER — ACETAMINOPHEN 325 MG PO TABS: 650.0000 mg | ORAL_TABLET | ORAL | Status: DC | PRN

## 2018-04-26 MED ORDER — ONDANSETRON HCL 4 MG/2ML IJ SOLN
INTRAMUSCULAR | Status: DC | PRN
  Administered 2018-04-26: 4 mg via INTRAVENOUS

## 2018-04-26 MED ORDER — HEPARIN (PORCINE) IN NACL 1000-0.9 UT/500ML-% IV SOLN
INTRAVENOUS | Status: AC
  Filled 2018-04-26: qty 1000

## 2018-04-26 MED ORDER — CLOPIDOGREL BISULFATE 300 MG PO TABS
ORAL_TABLET | ORAL | Status: DC | PRN
  Administered 2018-04-26: 600 mg via ORAL

## 2018-04-26 MED ORDER — SODIUM CHLORIDE 0.9% FLUSH: 3.0000 mL | Freq: Two times a day (BID) | INTRAVENOUS | Status: DC

## 2018-04-26 MED ORDER — METFORMIN HCL 500 MG PO TABS: ORAL_TABLET | ORAL | 3 refills | Status: DC

## 2018-04-26 MED ORDER — FENTANYL CITRATE (PF) 100 MCG/2ML IJ SOLN
INTRAMUSCULAR | Status: DC | PRN
  Administered 2018-04-26 (×2): 25 ug via INTRAVENOUS

## 2018-04-26 MED ORDER — SODIUM CHLORIDE 0.9 % IV SOLN: 250.0000 mL | INTRAVENOUS | Status: DC | PRN

## 2018-04-26 MED ORDER — HEPARIN SODIUM (PORCINE) 1000 UNIT/ML IJ SOLN
INTRAMUSCULAR | Status: AC
  Filled 2018-04-26: qty 1

## 2018-04-26 MED ORDER — CLOPIDOGREL BISULFATE 75 MG PO TABS: 75.0000 mg | ORAL_TABLET | Freq: Every day | ORAL | 0 refills | Status: DC

## 2018-04-26 MED ORDER — NITROGLYCERIN 1 MG/10 ML FOR IR/CATH LAB
INTRA_ARTERIAL | Status: AC
  Filled 2018-04-26: qty 10

## 2018-04-26 MED ORDER — LABETALOL HCL 5 MG/ML IV SOLN: 10.0000 mg | INTRAVENOUS | Status: AC | PRN

## 2018-04-26 MED ORDER — HYDRALAZINE HCL 20 MG/ML IJ SOLN: 5.0000 mg | INTRAMUSCULAR | Status: AC | PRN

## 2018-04-26 MED ORDER — ASPIRIN 81 MG PO CHEW: 81.0000 mg | CHEWABLE_TABLET | ORAL | Status: DC

## 2018-04-26 MED ORDER — IOHEXOL 350 MG/ML SOLN
INTRAVENOUS | Status: DC | PRN
  Administered 2018-04-26: 180 mL

## 2018-04-26 MED ORDER — CLOPIDOGREL BISULFATE 75 MG PO TABS: 75.0000 mg | ORAL_TABLET | Freq: Every day | ORAL | 4 refills | Status: DC

## 2018-04-26 MED ORDER — LIDOCAINE HCL (PF) 1 % IJ SOLN
INTRAMUSCULAR | Status: DC | PRN
  Administered 2018-04-26: 2 mL

## 2018-04-26 MED ORDER — HEPARIN (PORCINE) IN NACL 1000-0.9 UT/500ML-% IV SOLN
INTRAVENOUS | Status: DC | PRN
  Administered 2018-04-26 (×2): 500 mL

## 2018-04-26 MED ORDER — MIDAZOLAM HCL 2 MG/2ML IJ SOLN
INTRAMUSCULAR | Status: AC
  Filled 2018-04-26: qty 2

## 2018-04-26 MED ORDER — CLOPIDOGREL BISULFATE 300 MG PO TABS
ORAL_TABLET | ORAL | Status: AC
  Filled 2018-04-26: qty 2

## 2018-04-26 MED ORDER — SODIUM CHLORIDE 0.9 % WEIGHT BASED INFUSION: 1.0000 mL/kg/h | INTRAVENOUS | Status: DC

## 2018-04-26 MED ORDER — FENTANYL CITRATE (PF) 100 MCG/2ML IJ SOLN
INTRAMUSCULAR | Status: AC
  Filled 2018-04-26: qty 2

## 2018-04-26 MED ORDER — LIDOCAINE HCL (PF) 1 % IJ SOLN
INTRAMUSCULAR | Status: AC
  Filled 2018-04-26: qty 30

## 2018-04-26 MED ORDER — NITROGLYCERIN 1 MG/10 ML FOR IR/CATH LAB
INTRA_ARTERIAL | Status: DC | PRN
  Administered 2018-04-26: 200 ug via INTRACORONARY

## 2018-04-26 MED ORDER — ONDANSETRON HCL 4 MG/2ML IJ SOLN: 4.0000 mg | Freq: Four times a day (QID) | INTRAMUSCULAR | Status: DC | PRN

## 2018-04-26 MED ORDER — ONDANSETRON HCL 4 MG/2ML IJ SOLN
INTRAMUSCULAR | Status: AC
  Filled 2018-04-26: qty 2

## 2018-04-26 MED ORDER — SODIUM CHLORIDE 0.9 % IV SOLN: INTRAVENOUS | Status: AC

## 2018-04-26 MED ORDER — ANGIOPLASTY BOOK
Freq: Once | Status: DC
  Filled 2018-04-26 (×2): qty 1

## 2018-04-26 MED ORDER — ASPIRIN EC 81 MG PO TBEC: 81.0000 mg | DELAYED_RELEASE_TABLET | Freq: Every day | ORAL | 0 refills | Status: DC

## 2018-04-26 MED ORDER — ASPIRIN 81 MG PO CHEW: 81.0000 mg | CHEWABLE_TABLET | Freq: Every day | ORAL | Status: AC

## 2018-04-26 MED ORDER — MIDAZOLAM HCL 2 MG/2ML IJ SOLN
INTRAMUSCULAR | Status: DC | PRN
  Administered 2018-04-26 (×2): 1 mg via INTRAVENOUS

## 2018-04-26 MED ORDER — CLOPIDOGREL BISULFATE 75 MG PO TABS: 75.0000 mg | ORAL_TABLET | Freq: Every day | ORAL | Status: DC

## 2018-04-26 MED ORDER — ASPIRIN 81 MG PO CHEW: 81.0000 mg | CHEWABLE_TABLET | Freq: Every day | ORAL | Status: DC

## 2018-04-26 MED ORDER — VERAPAMIL HCL 2.5 MG/ML IV SOLN
INTRAVENOUS | Status: DC | PRN
  Administered 2018-04-26: 10 mL via INTRA_ARTERIAL

## 2018-04-26 MED ORDER — SODIUM CHLORIDE 0.9 % WEIGHT BASED INFUSION
3.0000 mL/kg/h | INTRAVENOUS | Status: AC
  Administered 2018-04-26: 3 mL/kg/h via INTRAVENOUS

## 2018-04-26 MED ORDER — SODIUM CHLORIDE 0.9% FLUSH: 3.0000 mL | INTRAVENOUS | Status: DC | PRN

## 2018-04-26 MED ORDER — HEPARIN SODIUM (PORCINE) 1000 UNIT/ML IJ SOLN
INTRAMUSCULAR | Status: DC | PRN
  Administered 2018-04-26: 6000 [IU] via INTRAVENOUS
  Administered 2018-04-26: 2000 [IU] via INTRAVENOUS
  Administered 2018-04-26: 5000 [IU] via INTRAVENOUS

## 2018-04-26 SURGICAL SUPPLY — 18 items
BALLN ~~LOC~~ EUPHORA RX 4.5X12 (BALLOONS) ×2
BALLOON ~~LOC~~ EUPHORA RX 4.5X12 (BALLOONS) ×1 IMPLANT
CATH 5FR JL3.5 JR4 ANG PIG MP (CATHETERS) ×2 IMPLANT
CATH OPTICROSS HD (CATHETERS) ×2 IMPLANT
CATH VISTA GUIDE 6FR JR4 SH (CATHETERS) ×2 IMPLANT
DEVICE RAD COMP TR BAND LRG (VASCULAR PRODUCTS) ×2 IMPLANT
GLIDESHEATH SLEND A-KIT 6F 22G (SHEATH) ×2 IMPLANT
GUIDEWIRE INQWIRE 1.5J.035X260 (WIRE) ×1 IMPLANT
INQWIRE 1.5J .035X260CM (WIRE) ×2
KIT ENCORE 26 ADVANTAGE (KITS) ×2 IMPLANT
KIT HEART LEFT (KITS) ×2 IMPLANT
PACK CARDIAC CATHETERIZATION (CUSTOM PROCEDURE TRAY) ×2 IMPLANT
SLED PULL BACK IVUS (MISCELLANEOUS) ×2 IMPLANT
STENT SIERRA 4.00 X 12 MM (Permanent Stent) ×2 IMPLANT
TRANSDUCER W/STOPCOCK (MISCELLANEOUS) ×2 IMPLANT
TUBING CIL FLEX 10 FLL-RA (TUBING) ×2 IMPLANT
WIRE HI TORQ BMW 190CM (WIRE) ×2 IMPLANT
WIRE RUNTHROUGH .014X180CM (WIRE) ×2 IMPLANT

## 2018-04-26 NOTE — Discharge Instructions (Signed)
Drink plenty of fluids.  Keep right arm at or above heart level Radial Site Care Refer to this sheet in the next few weeks. These instructions provide you with information about caring for yourself after your procedure. Your health care provider may also give you more specific instructions. Your treatment has been planned according to current medical practices, but problems sometimes occur. Call your health care provider if you have any problems or questions after your procedure. What can I expect after the procedure? After your procedure, it is typical to have the following:  Bruising at the radial site that usually fades within 1-2 weeks.  Blood collecting in the tissue (hematoma) that may be painful to the touch. It should usually decrease in size and tenderness within 1-2 weeks.  Follow these instructions at home:  Take medicines only as directed by your health care provider.  You may shower 24-48 hours after the procedure or as directed by your health care provider. Remove the bandage (dressing) and gently wash the site with plain soap and water. Pat the area dry with a clean towel. Do not rub the site, because this may cause bleeding.  Do not take baths, swim, or use a hot tub until your health care provider approves.  Check your insertion site every day for redness, swelling, or drainage.  Do not apply powder or lotion to the site.  Do not flex or bend the affected arm for 24 hours or as directed by your health care provider.  Do not push or pull heavy objects with the affected arm for 24 hours or as directed by your health care provider.  Do not lift over 10 lb (4.5 kg) for 5 days after your procedure or as directed by your health care provider.  Ask your health care provider when it is okay to: ? Return to work or school. ? Resume usual physical activities or sports. ? Resume sexual activity.  Do not drive home if you are discharged the same day as the procedure. Have  someone else drive you.  You may drive 24 hours after the procedure unless otherwise instructed by your health care provider.  Do not operate machinery or power tools for 24 hours after the procedure.  If your procedure was done as an outpatient procedure, which means that you went home the same day as your procedure, a responsible adult should be with you for the first 24 hours after you arrive home.  Keep all follow-up visits as directed by your health care provider. This is important. Contact a health care provider if:  You have a fever.  You have chills.  You have increased bleeding from the radial site. Hold pressure on the site. Get help right away if:  You have unusual pain at the radial site.  You have redness, warmth, or swelling at the radial site.  You have drainage (other than a small amount of blood on the dressing) from the radial site.  The radial site is bleeding, and the bleeding does not stop after 30 minutes of holding steady pressure on the site.  Your arm or hand becomes pale, cool, tingly, or numb. This information is not intended to replace advice given to you by your health care provider. Make sure you discuss any questions you have with your health care provider. Document Released: 06/25/2010 Document Revised: 10/29/2015 Document Reviewed: 12/09/2013 Elsevier Interactive Patient Education  2018 [employer].

## 2018-04-26 NOTE — Interval H&P Note (Signed)
History and Physical Interval Note:  04/26/2018 6:46 AM  Elizabeth Fleming  has presented today for cardiac catheterization, with the diagnosis of accelerating angina and abnormal cardiac CTA.  The various methods of treatment have been discussed with the patient and family. After consideration of risks, benefits and other options for treatment, the patient has consented to  Procedure(s): LEFT HEART CATH AND CORONARY ANGIOGRAPHY (N/A) as a surgical intervention .  The patient's history has been reviewed, patient examined, no change in status, stable for surgery.  I have reviewed the patient's chart and labs.  Questions were answered to the patient's satisfaction.    Cath Lab Visit (complete for each Cath Lab visit)  Clinical Evaluation Leading to the Procedure:   ACS: No.  Non-ACS:    Anginal Classification: CCS III  Anti-ischemic medical therapy: Maximal Therapy (2 or more classes of medications)  Non-Invasive Test Results: Intermediate-risk stress test findings: cardiac mortality 1-3%/year  Prior CABG: No previous CABG  Elizabeth Fleming

## 2018-04-26 NOTE — Progress Notes (Signed)
1030-1100 Stressed importance of plavix with stent. Reviewed NTG use, walking for ex, gave heart healthy diet and discussed CRP 2. Referred to [facility]. Pt voiced understanding. Did not do teachback as pt needed to go to bathroom quickly when RN arrived. Graylon Good RN BSN 04/26/2018 10:58 AM

## 2018-04-26 NOTE — Brief Op Note (Signed)
BRIEF CARDIAC CATHETERIZATION NOTE  DATE: 04/26/2018 TIME: 9:07 AM  PATIENT:  Elizabeth Fleming  52 y.o. female  PRE-OPERATIVE DIAGNOSIS:  Accelerating angina and abnormal cardiac CTA  POST-OPERATIVE DIAGNOSIS:  Same  PROCEDURE:  Procedure(s): LEFT HEART CATH AND CORONARY ANGIOGRAPHY (N/A) Intravascular Ultrasound/IVUS (N/A)  SURGEON:  Surgeon(s) and Role:    Nelva Bush, MD - Primary  FINDINGS: 1. Significant single-vessel coronary artery disease with 60-70% ostial RCA stenosis that was found to be hemodynamically significant by CT FFR. 2. Mild LAD and LCx disease.  Stenosis at ostium of LCx almost completely resolves with intracoronary nitroglycerin consistent with vasospasm. 3. Normal LVEDP and LVEF. 4. Successful IVUS-guided PCI to the ostial RCA using a Xience Sierra 4.0 x 12 mm drug-eluting stent with 0% residual stenosis and TIMI-3 flow.  RECOMMENDATIONS: 1. DAPT with ASA and clopidogrel for at least 6 months. 2. Aggressive secondary prevention. 3. Anticipate same-day discharge and outpatient follow-up in [place] in 1-2 weeks.  Nelva Bush, MD [facility] Pager: [phone]

## 2018-04-26 NOTE — Discharge Summary (Signed)
Discharge Summary    Patient ID: Elizabeth Fleming MRN: [id number]; DOB: 13-Mar-1966  Admit date: 04/26/2018 Discharge date: 04/26/2018  Primary Care Provider: Lucille Passy, MD  Primary Cardiologist: Nelva Bush, MD   Discharge Diagnoses    Principal Problem:   Accelerating angina [facility]) Active Problems:   Abnormal cardiac CT angiography  Allergies Allergies  Allergen Reactions  . Shellfish Allergy    Diagnostic Studies/Procedures    Cardiac catheterization 04/26/2018: Conclusions: 1. Significant single-vessel coronary artery disease with 60-70% ostial RCA stenosis (MLA 5.8 mm by IVUS) that was found to be hemodynamically significant by recent CT FFR. 2. Mild LAD and LCx disease. Stenosis at ostium of LCx almost completely resolves with intracoronary nitroglycerin consistent with vasospasm. 3. Normal left ventricular systolic function and filling pressure. 4. Successful IVUS-guided PCI to the ostial RCA using a Xience Sierra 4.0 x 12 mm drug-eluting stent (post-dilated to 4.6 mm) with 0% residual stenosis and TIMI-3 flow.  Recommendations: 1. Aggressive secondary prevention.  Continue current doses of atorvastatin and isosorbide mononitrate. 2. Anticipate same-day discharge and outpatient follow-up in [place] in 1-2 weeks.  Recommend uninterrupted dual antiplatelet therapy with Aspirin 40m daily and Clopidogrel 790mdaily for a minimum of 6 months (stable ischemic heart disease - Class I recommendation).  _____________   History of Present Illness     [name] a [age].o. year-old female with history of hypertension, prediabetes, and depression, who presents for follow-up of chest pain and shortness of breath with abnormal cardiac CTA.  Dr. EnSaunders Revelet with [name] in late July, at which time she reported intermittent exertional dyspnea and chest discomfort.  Plans were discussed to perform a cardiac CTA, which was completed 04/03/18.  This showed significant  single-vessel disease with moderate to severe ostial RCA stenosis that was positive by CT FFR.  On 04/09/18 she was seen in the office for follow up and reported that she had continued to have intermittent shortness of breath and chest pain, particularly when walking.  She also had brief spells at rest from time to time. Typically, however, the chest tightness and dyspnea resolved after she would rest for a few minutes. She felt as though her symptoms had gradually worsened over the last 6 months. She denied edema, orthopnea, and PND, as well as palpitations and lightheadedness. Plans were made for cardiac catheterization scheduled for 04/26/2018:  Hospital Course   Cardiac cath revealed significant single-vessel coronary artery disease with 60 to 70% ostial RCA stenosis that was found to be hemodynamically significant by recent CT FFR.  There was evidence of mild LAD and LCx disease.  Stenosis at ostium of LCx almost completely resolved with IC nitroglycerin consistent with vasospasm.  There was normal LV systolic function and filling pressure.  A successful IVUS-guided PCI to the ostial RCA using a Xience DES was placed with 0% residual stenosis.  Recommendations were for aggressive secondary prevention with continued current doses of atorvastatin and isosorbide mononitrate, as well as uninterrupted dual antiplatelet therapy with ASA 81 mg daily and clopidogrel 75 mg daily for a minimum of 6 months.    Consultants: None  The patient was seen and examined by Dr. EnSaunders Revelho feels that she is stable and ready for discharge on 04/26/2018.  Cath site unremarkable. Post cath precautions reviewed. Follow-up appointment has been made.  Denies recurrent chest pain. _____________  Discharge Vitals Blood pressure 119/64, pulse 72, temperature 98.1 F (36.7 C), temperature source Oral, resp. rate 16, height 5' 5" (  1.651 m), weight 108.9 kg, SpO2 100 %.  Filed Weights   04/26/18 0549  Weight: 108.9 kg   Labs  & Radiologic Studies    Ct Coronary Morph W/cta Cor W/score W/ca W/cm &/or Wo/cm  Addendum Date: 03/30/2018   ADDENDUM REPORT: 03/30/2018 18:32 HISTORY: Atypical chest pain EXAM: Cardiac/Coronary  CT TECHNIQUE: The patient was scanned on a [employer]. PROTOCOL: A 120 kV prospective scan was triggered in the descending thoracic aorta at 111 HU's. Axial non-contrast 3 mm slices were carried out through the heart. The data set was analyzed on a dedicated work station and scored using the [place]. Gantry rotation speed was 250 msecs and collimation was .6 mm. No beta blockade and 0.8 mg of sl NTG was given. The 3D data set was reconstructed in 5% intervals of the 67-82 % of the R-R cycle. Diastolic phases were analyzed on a dedicated work station using MPR, MIP and VRT modes. The patient received 80 cc of contrast. FINDINGS: Image quality is degraded on today's exam, which may affect interpretation of exact luminal stenosis of the coronary arteries. Coronary calcium score: The patient's coronary artery calcium score is 0, which places the patient in the 0 percentile. Coronary arteries: Normal coronary origins.  Right dominance. Right Coronary Artery: There is an ostial approximately 50% stenosis of the RCA with otherwise normal-appearing proximal right coronary artery. Minimal atherosclerotic plaque in the mid RCA, less than 25% stenosis. Left Main Coronary Artery: No significant plaque or stenosis. Left Anterior Descending Coronary Artery: Mild atherosclerotic plaque in the proximal LAD, 25 to 49% stenosis. Minimal scattered plaque in the mid and distal LAD. Patent septal perforators and diagonal branches. Left Circumflex Artery: Minimal scattered disease without significant plaque or stenosis. Patent obtuse marginal branch Aorta:  Normal size.  No calcifications.  No dissection. Aortic Valve:  Trileaflet.  No calcifications. Other findings: Normal pulmonary vein drainage into the left atrium.  Normal let atrial appendage without a thrombus. Normal size of the pulmonary artery. IMPRESSION: 1. Coronary calcium score of 0. This was 0 percentile for age and sex matched control. 2. Normal coronary origin with right dominance. 3. Mild-moderate CAD with 50% ostial stenosis of the RCA and mild atherosclerosis of the proximal LAD, CADRADS = 2-3. CT FFR coronary flow analysis will be obtained. Electronically Signed   By: Cherlynn Kaiser   On: 03/30/2018 18:32   Result Date: 03/30/2018 EXAM: OVER-READ INTERPRETATION  CT CHEST The following report is an over-read performed by radiologist Dr. Aletta Edouard of [facility], [place] on 03/30/2018. This over-read does not include interpretation of cardiac or coronary anatomy or pathology. The coronary CTA interpretation by the cardiologist is attached. COMPARISON:  None. FINDINGS: Mediastinum/Nodes: No visualized lymphadenopathy or masses. Lungs/Pleura: Visualized lungs no evidence of pulmonary edema, consolidation, pneumothorax, nodule or pleural fluid. Upper Abdomen: Probable small hiatal hernia.  No acute abnormality. Musculoskeletal: No bony findings. IMPRESSION: No significant findings.  Probable small hiatal hernia. Electronically Signed: By: Aletta Edouard M.D. On: 03/30/2018 16:33   Ct Coronary Fractional Flow Reserve Fluid Analysis  Result Date: 04/03/2018 EXAM: CT FFR ANALYSIS CLINICAL DATA:  Atypical Chest pain FINDINGS: FFRct analysis was performed on the original cardiac CT angiogram dataset. Diagrammatic representation of the FFRct analysis is provided in a separate PDF document in PACS. This dictation was created using the PDF document and an interactive 3D model of the results. 3D model is not available in the EMR/PACS. Normal FFR range is >0.80. 1. Left Main:  No significant stenosis.  FFR = 0.97 2. LAD: No significant stenosis. Prox FFR = 0.95, mid FFR = 0.95, distal FFR = not analyzed 3. LCX: No significant stenosis.  Prox FFR = 0.94,  distal FFR =0.90 4. RCA: Severe stenosis. Prox FFR = 0.74, mid FFR = 0.74, distal FFR =0.66 IMPRESSION: 1. CT FFR analysis showed severe ostial stenosis in the RCA FFR = 0.74 Electronically Signed   By: Cherlynn Kaiser   On: 04/03/2018 14:37   Disposition   Pt is being discharged home today in good condition.  Follow-up Plans & Appointments   Follow-up Information    End, Harrell Gave, MD Follow up on 05/14/2018.   Specialty:  Cardiology Why:  Your follow-up appointment will be on 05/14/2018 with Dr. Saunders Revel at 4 PM.  Please arrive at 3:45 PM. Contact information: [place] [address] [phone]          Discharge Instructions    AMB Referral to Cardiac Rehabilitation - Phase II   Complete by:  As directed    Diagnosis:  Coronary Stents   Amb Referral to Cardiac Rehabilitation   Complete by:  As directed    Diagnosis:  Coronary Stents   Call MD for:  difficulty breathing, headache or visual disturbances   Complete by:  As directed    Call MD for:  extreme fatigue   Complete by:  As directed    Call MD for:  hives   Complete by:  As directed    Call MD for:  persistant dizziness or light-headedness   Complete by:  As directed    Call MD for:  persistant nausea and vomiting   Complete by:  As directed    Call MD for:  redness, tenderness, or signs of infection (pain, swelling, redness, odor or green/yellow discharge around incision site)   Complete by:  As directed    Call MD for:  severe uncontrolled pain   Complete by:  As directed    Call MD for:  temperature >100.4   Complete by:  As directed    Diet - low sodium heart healthy   Complete by:  As directed    Discharge instructions   Complete by:  As directed    Please resume your Metformin on 04/28/18  No driving for 3 days. No lifting over 5 lbs for 1 week. No sexual activity for 1 week. You may return to work on 04/30/18. Keep procedure site clean & dry. If you notice increased pain,  swelling, bleeding or pus, call/return!  You may shower, but no soaking baths/hot tubs/pools for 1 week.   PLEASE DO NOT MISS ANY DOSES OF YOUR PLAVIX!!!!! Also keep a log of you blood pressures and bring back to your follow up appt. Please call the office with any questions.   Patients taking blood thinners should generally stay away from medicines like ibuprofen, Advil, Motrin, naproxen, and Aleve due to risk of stomach bleeding. You may take Tylenol as directed or talk to your primary doctor about alternatives.  Some studies suggest Prilosec/Omeprazole interacts with Plavix. If you have reflux symptoms, please take Protonix for less chance of interaction.   Increase activity slowly   Complete by:  As directed      Discharge Medications   Allergies as of 04/26/2018      Reactions   Shellfish Allergy       Medication List    STOP taking these medications   aspirin 81  MG tablet Replaced by:  aspirin 81 MG chewable tablet     TAKE these medications   albuterol 108 (90 Base) MCG/ACT inhaler Commonly known as:  PROVENTIL HFA;VENTOLIN HFA Inhale 2 puffs into the lungs every 6 (six) hours as needed.   aspirin 81 MG chewable tablet Chew 1 tablet (81 mg total) by mouth daily. Start taking on:  04/27/2018 Replaces:  aspirin 81 MG tablet   atenolol 50 MG tablet Commonly known as:  TENORMIN Take 1qd   atorvastatin 40 MG tablet Commonly known as:  LIPITOR Take 1 tablet (40 mg total) by mouth daily.   buPROPion 150 MG 24 hr tablet Commonly known as:  WELLBUTRIN XL Take 1 tablet (150 mg total) by mouth daily.   cetirizine 10 MG tablet Commonly known as:  ZYRTEC Take 1 tablet (10 mg total) by mouth daily.   clopidogrel 75 MG tablet Commonly known as:  PLAVIX Take 1 tablet (75 mg total) by mouth daily.   fish oil-omega-3 fatty acids 1000 MG capsule Take 1 g by mouth daily.   fluticasone 50 MCG/ACT nasal spray Commonly known as:  FLONASE Dispense 3 inhalers   isosorbide  mononitrate 30 MG 24 hr tablet Commonly known as:  IMDUR Take 0.5 tablets (15 mg total) by mouth daily.   metFORMIN 500 MG tablet Commonly known as:  GLUCOPHAGE TAKE 1 TABLET BY MOUTH TWICE DAILY WITH A MEAL. Start taking on:  04/29/2018 What changed:  These instructions start on 04/29/2018. If you are unsure what to do until then, ask your doctor or other care provider.   nitroGLYCERIN 0.4 MG SL tablet Commonly known as:  NITROSTAT Place 1 tablet (0.4 mg total) under the tongue every 5 (five) minutes as needed for chest pain. Maximum of 3 doses.        Acute coronary syndrome (MI, NSTEMI, STEMI, etc) this admission?: No.    Outstanding Labs/Studies   None   Duration of Discharge Encounter   Greater than 30 minutes including physician time.  Signed, Kathyrn Drown, NP 04/26/2018, 1:26 PM

## 2018-04-26 NOTE — Progress Notes (Signed)
Angioplasty book and work note given to pt.

## 2018-04-27 ENCOUNTER — Encounter (HOSPITAL_COMMUNITY): Payer: Self-pay | Admitting: Internal Medicine

## 2018-05-14 ENCOUNTER — Ambulatory Visit: Payer: Self-pay | Admitting: Internal Medicine

## 2018-05-21 ENCOUNTER — Other Ambulatory Visit: Payer: Self-pay

## 2018-05-21 MED ORDER — CLOPIDOGREL BISULFATE 75 MG PO TABS: 75.0000 mg | ORAL_TABLET | Freq: Every day | ORAL | 3 refills | Status: DC

## 2018-05-21 MED ORDER — CLOPIDOGREL BISULFATE 75 MG PO TABS: 75.0000 mg | ORAL_TABLET | Freq: Every day | ORAL | 0 refills | Status: DC

## 2018-05-21 NOTE — Telephone Encounter (Signed)
Requested Prescriptions   Signed Prescriptions Disp Refills  . clopidogrel (PLAVIX) 75 MG tablet 30 tablet 0    Sig: Take 1 tablet (75 mg total) by mouth daily.    Authorizing Provider: END, CHRISTOPHER    Ordering User: Janan Ridge

## 2018-05-21 NOTE — Telephone Encounter (Signed)
[facility] day supply [facility] to [facility].   clopidogrel (PLAVIX) 75 MG tablet 90 tablet 3 05/21/2018    Sig - Route: Take 1 tablet (75 mg total) by mouth daily. - Oral   Sent to pharmacy as: clopidogrel (PLAVIX) 75 MG tablet   E-Prescribing Status: Transmission to pharmacy in progress (05/21/2018 11:19 AM EST)   Pharmacy   [facility] [place], [place]

## 2018-05-28 ENCOUNTER — Encounter: Payer: Self-pay | Admitting: Nurse Practitioner

## 2018-05-28 ENCOUNTER — Ambulatory Visit (INDEPENDENT_AMBULATORY_CARE_PROVIDER_SITE_OTHER): Payer: BLUE CROSS/BLUE SHIELD | Admitting: Nurse Practitioner

## 2018-05-28 VITALS — BP 120/78 | HR 66 | Ht 64.54 in | Wt 252.0 lb

## 2018-05-28 DIAGNOSIS — R002 Palpitations: Secondary | ICD-10-CM

## 2018-05-28 DIAGNOSIS — E785 Hyperlipidemia, unspecified: Secondary | ICD-10-CM | POA: Diagnosis not present

## 2018-05-28 DIAGNOSIS — R072 Precordial pain: Secondary | ICD-10-CM | POA: Diagnosis not present

## 2018-05-28 DIAGNOSIS — I2510 Atherosclerotic heart disease of native coronary artery without angina pectoris: Secondary | ICD-10-CM | POA: Diagnosis not present

## 2018-05-28 DIAGNOSIS — I10 Essential (primary) hypertension: Secondary | ICD-10-CM

## 2018-05-28 MED ORDER — AMLODIPINE BESYLATE 2.5 MG PO TABS: 2.5000 mg | ORAL_TABLET | Freq: Every day | ORAL | 2 refills | Status: DC

## 2018-05-28 NOTE — Patient Instructions (Signed)
Medication Instructions:  Your physician has recommended you make the following change in your medication:  1- START Amlodipine 2.5 mg (1 tablet) by mouth once a day.  If you need a refill on your cardiac medications before your next appointment, please call your pharmacy.   Lab work: Your physician recommends that you return for lab work in: TODAY - CMET, LIPID, DIRECT LDL, Mg, TSH.  If you have labs (blood work) drawn today and your tests are completely normal, you will receive your results only by: Marland Kitchen MyChart Message (if you have MyChart) OR . A paper copy in the mail If you have any lab test that is abnormal or we need to change your treatment, we will call you to review the results.  Testing/Procedures: none  Follow-Up: At [facility], you and your health needs are our priority.  As part of our continuing mission to provide you with exceptional heart care, we have created designated Provider Care Teams.  These Care Teams include your primary Cardiologist (physician) and Advanced Practice Providers (APPs -  Physician Assistants and Nurse Practitioners) who all work together to provide you with the care you need, when you need it. You will need a follow up appointment in 1 months.  Please call our office 2 months in advance to schedule this appointment.  You may see Nelva Bush, MD or one of the following Advanced Practice Providers on your designated Care Team:   Murray Hodgkins, NP Christell Faith, PA-C . Marrianne Mood, PA-C

## 2018-05-28 NOTE — Progress Notes (Signed)
Office Visit    Patient Name: Elizabeth Fleming Date of Encounter: 05/28/2018  Primary Care Provider:  Lucille Passy, MD Primary Cardiologist:  Nelva Bush, MD  Chief Complaint    52 y/o ? w/ a h/o HTN, prediabetes, and depression, who presents for f/u after recent RCA stenting.  Past Medical History    Past Medical History:  Diagnosis Date  . Allergy   . CAD (coronary artery disease)    a. 03/2018 Cardiac CTA: Sev RCA dzs (FFR 0.74p/m, 0.66d); b. 04/2018 Cath/PCI: LM nl, LAD min irregs, D1/2 nl, LCX 20ost (initially 60%-->improved w/ IC ntg), OM1/2/3 nl, RCA 65ost (4.0x12 [place] DES), RPDA/RPAV nl, EF 55-65%.  . Colon polyps 2012  . Depression   . Genital warts   . Hyperlipidemia   . Hypertension   . Migraines   . Prediabetes    Past Surgical History:  Procedure Laterality Date  . CHOLECYSTECTOMY    . COLONOSCOPY WITH PROPOFOL N/A 11/03/2017   Procedure: COLONOSCOPY WITH PROPOFOL;  Surgeon: Virgel Manifold, MD;  Location: [facility];  Service: Endoscopy;  Laterality: N/A;  . INTRAVASCULAR ULTRASOUND/IVUS N/A 04/26/2018   Procedure: Intravascular Ultrasound/IVUS;  Surgeon: Nelva Bush, MD;  Location: [place] [facility];  Service: Cardiovascular;  Laterality: N/A;  . LEFT HEART CATH AND CORONARY ANGIOGRAPHY N/A 04/26/2018   Procedure: LEFT HEART CATH AND CORONARY ANGIOGRAPHY;  Surgeon: Nelva Bush, MD;  Location: [place] [facility];  Service: Cardiovascular;  Laterality: N/A;  . TONSILLECTOMY  1974    Allergies  Allergies  Allergen Reactions  . Shellfish Allergy     History of Present Illness    52 year old female with a history of hypertension, prediabetes, and depression.  She was previously evaluated in the summer 2019 with complaints of intermittent exertional dyspnea and chest discomfort.  Cardiac CT angiography was pursued and subsequently performed in October which showed significant single-vessel disease with moderate severe ostial  RCA stenosis that was positive by CT FFR.  She followed up in clinic on November 4 and continues report intermittent dyspnea and chest discomfort, particularly while walking.  She subsequently underwent diagnostic catheterization in late November, which revealed a 65% ostial stenosis of the RCA, which was successfully intervened upon with a [place] drug-eluting stent.  Following her PCI, she says she has been somewhat slow in getting back to her routine activities such as walking her dogs.  That said, she was able to do this without any significant symptoms or limitations at a slow pace but on December 14, she says she was walking her dogs and did notice some dyspnea on exertion and mild chest discomfort.  The symptoms have recurred intermittently since then without any predictable pattern.  They are somewhat reminiscent of what she was experiencing prior to stenting.  She has not had any prolonged episodes of chest discomfort.  She has noticed over the past week or so isolated palpitations which she says feels like a bubble passing in her chest lasting about 3 seconds and resolving spontaneously.  These occur about twice a day and are unpredictable.  There are no associated symptoms such as presyncope, chest pain, or dyspnea.  She denies PND, orthopnea, dizziness, syncope, or early satiety.  She sometimes notes mild ankle swelling when on her feet for long periods of time.  Of note, she did have significant discomfort at her right radial cath site and up her right forearm and right medial bicep.  This mostly has resolved though she still  has what she describes as a residual reminder type discomfort that we had had catheters in her arm.  Home Medications    Prior to Admission medications   Medication Sig Start Date End Date Taking? Authorizing Provider  albuterol (PROVENTIL HFA;VENTOLIN HFA) 108 (90 Base) MCG/ACT inhaler Inhale 2 puffs into the lungs every 6 (six) hours as needed. 08/14/15   Lucille Passy,  MD  aspirin 81 MG chewable tablet Chew 1 tablet (81 mg total) by mouth daily. 04/27/18   Kathyrn Drown D, NP  atenolol (TENORMIN) 50 MG tablet Take 1qd 12/28/17   Lucille Passy, MD  atorvastatin (LIPITOR) 40 MG tablet Take 1 tablet (40 mg total) by mouth daily. 04/19/18 07/18/18  End, Harrell Gave, MD  buPROPion (WELLBUTRIN XL) 150 MG 24 hr tablet Take 1 tablet (150 mg total) by mouth daily. 10/24/17   Lucille Passy, MD  cetirizine (ZYRTEC) 10 MG tablet Take 1 tablet (10 mg total) by mouth daily. 01/26/17   Lucille Passy, MD  clopidogrel (PLAVIX) 75 MG tablet Take 1 tablet (75 mg total) by mouth daily. 05/21/18   End, Harrell Gave, MD  fish oil-omega-3 fatty acids 1000 MG capsule Take 1 g by mouth daily.      [provider]  fluticasone Asencion Islam) 50 MCG/ACT nasal spray Dispense 3 inhalers 04/19/18   Lucille Passy, MD  metFORMIN (GLUCOPHAGE) 500 MG tablet TAKE 1 TABLET BY MOUTH TWICE DAILY WITH A MEAL. 04/29/18   End, Harrell Gave, MD  nitroGLYCERIN (NITROSTAT) 0.4 MG SL tablet Place 1 tablet (0.4 mg total) under the tongue every 5 (five) minutes as needed for chest pain. Maximum of 3 doses. 04/09/18 07/08/18  End, Harrell Gave, MD    Review of Systems    Over the past 10 days or so, she has noted intermittent dyspnea on exertion and mild chest discomfort that resolves with rest.  She has not had any prolonged symptoms.  As above, there is no predictability when symptoms might occur.  She has occasional lower extremity swelling is on her feet all day.  She has also had brief episodes of palpitations.  Right arm discomfort as above.  She denies PND, orthopnea, dizziness, syncope, or early satiety.  All other systems reviewed and are otherwise negative except as noted above.  Physical Exam    VS:  BP 120/78 (BP Location: Left Arm, Patient Position: Sitting, Cuff Size: Normal)   Pulse 66   Ht 5' 4.54" (1.639 m)   Wt 252 lb (114.3 kg)   BMI 42.53 kg/m  , BMI Body mass index is 42.53 kg/m. GEN:  Well nourished, well developed, in no acute distress. HEENT: normal. Neck: Supple, no JVD, carotid bruits, or masses. Cardiac: RRR, no murmurs, rubs, or gallops. No clubbing, cyanosis, edema.  Radials/PT 2+ and equal bilaterally.  Right radial cath site without bleeding, bruit, or hematoma.  Normal sensation. Respiratory:  Respirations regular and unlabored, clear to auscultation bilaterally. GI: Soft, nontender, nondistended, BS + x 4. MS: no deformity or atrophy. Skin: warm and dry, no rash. Neuro:  Strength and sensation are intact. Psych: Normal affect.  Accessory Clinical Findings    ECG personally reviewed by me today -regular sinus rhythm, 66, nonspecific T changes- no acute changes.  Assessment & Plan    1.  Coronary artery disease/chest pain: Status post abnormal cardiac CT angiography with FFR involving the right coronary artery followed by diagnostic catheterization in November which showed moderately obstructed ostial RCA.  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  The RCA was successfully treated with drug-eluting stent.  She did well following her procedure but notes that in the past 10 days, she has had intermittent chest discomfort and dyspnea.  Symptoms are reminiscent of what she experienced prior to stenting.  ECG is unchanged today.  She had previously been on isosorbide mononitrate but she stopped this secondary to severe headaches.  As she did have coronary vasospasm, I will add amlodipine 2.5 mg daily to her regimen.  We did discuss that if symptoms worsen or become more frequent, she will likely need repeat diagnostic catheterization.  She is not interested in this at this time and would prefer to pursue medical therapy first.  She otherwise remains on aspirin, beta-blocker, statin, and Plavix therapy.  2.  Palpitations: Patient reports that over the past week or so, she has been experiencing isolated skipped beats or fluttering in  her chest that lasts about 3 seconds and resolve spontaneously.  She said it feels like a bubble is passing through.  There are no associated symptoms.  This occurs about twice a day.  Based on her description, I suspect that she is having PVCs.  We did discuss possibly placing a Holter monitor but as symptoms are fairly limited and brief, we have opted to forego monitoring at this time.  I will follow-up a basic metabolic panel, magnesium, and TSH.  Continue beta-blocker therapy.  3.  Essential hypertension: Stable.  She does have a blood pressure cuff at home I have asked her to follow blood pressure with the addition of low-dose amlodipine.  I have also asked her to be on the look out for lower extremity swelling which may worsen on amlodipine.  4.  Hyperlipidemia: Statin dose was increased to 40 mg daily following PCI and LDL of 81 in November.  It is been 6 weeks since that titration and I will follow-up LFTs and lipids with a direct LDL today.  5.  Prediabetes: She is on metformin and her A1c is normal at 5.6.  6.  Right arm discomfort: Following her catheterization she has significant right wrist/forearm/medial bicep discomfort which has slowly resolved.  The radial arterial site is normal on examination today without bruit or hematoma.  Normal Allen's.    7.  Disposition: Follow-up labs today.  Plan to follow-up in 2 to 4 weeks to reassess symptoms on calcium channel blocker therapy.  Patient knows to call us if symptoms worsen or present to the ED.  Murray Hodgkins, NP 05/28/2018, 6:02 PM

## 2018-05-29 ENCOUNTER — Telehealth: Payer: Self-pay | Admitting: Internal Medicine

## 2018-05-29 ENCOUNTER — Telehealth: Payer: Self-pay | Admitting: *Deleted

## 2018-05-29 LAB — COMPREHENSIVE METABOLIC PANEL
A/G RATIO: 1.8 (ref 1.2–2.2)
ALBUMIN: 4.2 g/dL (ref 3.5–5.5)
ALT: 25 IU/L (ref 0–32)
AST: 22 IU/L (ref 0–40)
Alkaline Phosphatase: 126 IU/L — ABNORMAL HIGH (ref 39–117)
BILIRUBIN TOTAL: 0.6 mg/dL (ref 0.0–1.2)
BUN / CREAT RATIO: 16 (ref 9–23)
BUN: 10 mg/dL (ref 6–24)
CALCIUM: 8.8 mg/dL (ref 8.7–10.2)
CHLORIDE: 103 mmol/L (ref 96–106)
CO2: 21 mmol/L (ref 20–29)
Creatinine, Ser: 0.62 mg/dL (ref 0.57–1.00)
GFR, EST AFRICAN AMERICAN: 122 mL/min/{1.73_m2} (ref >59)
GFR, EST NON AFRICAN AMERICAN: 105 mL/min/{1.73_m2} (ref >59)
GLOBULIN, TOTAL: 2.4 g/dL (ref 1.5–4.5)
Glucose: 92 mg/dL (ref 65–99)
POTASSIUM: 4.2 mmol/L (ref 3.5–5.2)
Sodium: 140 mmol/L (ref 134–144)
TOTAL PROTEIN: 6.6 g/dL (ref 6.0–8.5)

## 2018-05-29 LAB — LIPID PANEL
Chol/HDL Ratio: 3.2 ratio (ref 0.0–4.4)
Cholesterol, Total: 146 mg/dL (ref 100–199)
HDL: 45 mg/dL (ref >39)
LDL CALC: 63 mg/dL (ref 0–99)
TRIGLYCERIDES: 191 mg/dL — AB (ref 0–149)
VLDL Cholesterol Cal: 38 mg/dL (ref 5–40)

## 2018-05-29 LAB — LDL CHOLESTEROL, DIRECT: LDL Direct: 75 mg/dL (ref 0–99)

## 2018-05-29 LAB — TSH: TSH: 2.76 u[IU]/mL (ref 0.450–4.500)

## 2018-05-29 LAB — MAGNESIUM: MAGNESIUM: 1.9 mg/dL (ref 1.6–2.3)

## 2018-05-29 NOTE — Telephone Encounter (Signed)
LMOV to schedule 1 month follow up

## 2018-05-29 NOTE — Telephone Encounter (Signed)
-----  Message from Theora Gianotti, NP sent at 05/29/2018  8:07 AM EST ----- Renal fxn, lytes, TSH wnl (thus no simple/lab-based explanation for palpitations).  Alk phos mildly elevated - similar to 6 mos ago - has been variable. Direct LDL 75 (just above goal of 70; calculated LDL @ goal - 63). Would focus on activity/diet/wt loss w/ plan for repeat in 6 mos before starting additional agent (Zetia).

## 2018-05-29 NOTE — Telephone Encounter (Signed)
No answer. Left message to call back.  Results released to My Chart.  

## 2018-06-12 NOTE — Telephone Encounter (Signed)
LMOV  

## 2018-06-13 ENCOUNTER — Telehealth: Payer: Self-pay

## 2018-06-13 MED ORDER — CLOPIDOGREL BISULFATE 75 MG PO TABS: 75.0000 mg | ORAL_TABLET | Freq: Every day | ORAL | 0 refills | Status: DC

## 2018-06-13 NOTE — Telephone Encounter (Signed)
Pt requested 90 day refill, sent to [facility]. RX sent in.

## 2018-06-19 ENCOUNTER — Telehealth: Payer: Self-pay | Admitting: Urology

## 2018-06-19 NOTE — Telephone Encounter (Signed)
I left a message for Elizabeth Fleming to call me back.  I would like to speak to her.

## 2018-06-20 NOTE — Telephone Encounter (Signed)
Patient scheduled 3/6

## 2018-07-01 ENCOUNTER — Encounter: Payer: Self-pay | Admitting: Urology

## 2018-07-03 ENCOUNTER — Telehealth: Payer: Self-pay | Admitting: Urology

## 2018-07-03 NOTE — Telephone Encounter (Signed)
Certified letter sent 07/03/2018.

## 2018-07-13 ENCOUNTER — Encounter: Payer: Self-pay | Admitting: Emergency Medicine

## 2018-07-13 ENCOUNTER — Emergency Department
Admission: EM | Admit: 2018-07-13 | Discharge: 2018-07-13 | Disposition: A | Discharge status: Home / Self Care | Payer: BLUE CROSS/BLUE SHIELD | Attending: Emergency Medicine | Admitting: Emergency Medicine

## 2018-07-13 ENCOUNTER — Emergency Department: Payer: BLUE CROSS/BLUE SHIELD

## 2018-07-13 DIAGNOSIS — Z7902 Long term (current) use of antithrombotics/antiplatelets: Secondary | ICD-10-CM | POA: Diagnosis not present

## 2018-07-13 DIAGNOSIS — S50812A Abrasion of left forearm, initial encounter: Secondary | ICD-10-CM

## 2018-07-13 DIAGNOSIS — M542 Cervicalgia: Secondary | ICD-10-CM | POA: Diagnosis not present

## 2018-07-13 DIAGNOSIS — Z7982 Long term (current) use of aspirin: Secondary | ICD-10-CM | POA: Diagnosis not present

## 2018-07-13 DIAGNOSIS — Z87891 Personal history of nicotine dependence: Secondary | ICD-10-CM | POA: Diagnosis not present

## 2018-07-13 DIAGNOSIS — R079 Chest pain, unspecified: Secondary | ICD-10-CM | POA: Diagnosis not present

## 2018-07-13 DIAGNOSIS — Y9389 Activity, other specified: Secondary | ICD-10-CM | POA: Insufficient documentation

## 2018-07-13 DIAGNOSIS — I2510 Atherosclerotic heart disease of native coronary artery without angina pectoris: Secondary | ICD-10-CM | POA: Insufficient documentation

## 2018-07-13 DIAGNOSIS — I10 Essential (primary) hypertension: Secondary | ICD-10-CM | POA: Insufficient documentation

## 2018-07-13 DIAGNOSIS — Y92410 Unspecified street and highway as the place of occurrence of the external cause: Secondary | ICD-10-CM | POA: Insufficient documentation

## 2018-07-13 DIAGNOSIS — S0990XA Unspecified injury of head, initial encounter: Secondary | ICD-10-CM | POA: Diagnosis not present

## 2018-07-13 DIAGNOSIS — S199XXA Unspecified injury of neck, initial encounter: Secondary | ICD-10-CM | POA: Diagnosis not present

## 2018-07-13 DIAGNOSIS — Z7984 Long term (current) use of oral hypoglycemic drugs: Secondary | ICD-10-CM | POA: Diagnosis not present

## 2018-07-13 DIAGNOSIS — S161XXA Strain of muscle, fascia and tendon at neck level, initial encounter: Secondary | ICD-10-CM | POA: Diagnosis not present

## 2018-07-13 DIAGNOSIS — Y999 Unspecified external cause status: Secondary | ICD-10-CM | POA: Insufficient documentation

## 2018-07-13 DIAGNOSIS — S299XXA Unspecified injury of thorax, initial encounter: Secondary | ICD-10-CM | POA: Diagnosis not present

## 2018-07-13 MED ORDER — CYCLOBENZAPRINE HCL 10 MG PO TABS: 10.0000 mg | ORAL_TABLET | Freq: Three times a day (TID) | ORAL | 0 refills | Status: DC | PRN

## 2018-07-13 MED ORDER — CYCLOBENZAPRINE HCL 10 MG PO TABS
10.0000 mg | ORAL_TABLET | Freq: Once | ORAL | Status: AC
  Administered 2018-07-13: 10 mg via ORAL
  Filled 2018-07-13: qty 1

## 2018-07-13 MED ORDER — TRAMADOL HCL 50 MG PO TABS
50.0000 mg | ORAL_TABLET | Freq: Once | ORAL | Status: AC
  Administered 2018-07-13: 50 mg via ORAL
  Filled 2018-07-13: qty 1
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
## 2018-07-13 NOTE — ED Notes (Signed)
Pt refuses thoracic spine xray; states it is her neck that is bothering her and she does not want any other imaging until she is seen by a physician.
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
## 2018-08-06 ENCOUNTER — Ambulatory Visit (INDEPENDENT_AMBULATORY_CARE_PROVIDER_SITE_OTHER): Payer: BLUE CROSS/BLUE SHIELD | Admitting: Family Medicine

## 2018-08-06 ENCOUNTER — Encounter: Payer: Self-pay | Admitting: Family Medicine

## 2018-08-06 VITALS — BP 110/70 | HR 90 | Temp 99.5°F | Ht 65.0 in

## 2018-08-06 DIAGNOSIS — K529 Noninfective gastroenteritis and colitis, unspecified: Secondary | ICD-10-CM | POA: Insufficient documentation

## 2018-08-06 DIAGNOSIS — R509 Fever, unspecified: Secondary | ICD-10-CM

## 2018-08-06 LAB — POC INFLUENZA A&B (BINAX/QUICKVUE)
INFLUENZA A, POC: NEGATIVE
Influenza B, POC: NEGATIVE

## 2018-08-06 MED ORDER — PROMETHAZINE HCL 25 MG/ML IJ SOLN
25.0000 mg | Freq: Once | INTRAMUSCULAR | Status: AC
  Administered 2018-08-06: 25 mg via INTRAMUSCULAR

## 2018-08-06 MED ORDER — PROMETHAZINE HCL 25 MG PO TABS: 25.0000 mg | ORAL_TABLET | Freq: Three times a day (TID) | ORAL | 0 refills | Status: DC | PRN

## 2018-08-06 NOTE — Patient Instructions (Signed)
Viral Gastroenteritis, Adult    Viral gastroenteritis is also known as the stomach flu. This condition is caused by various viruses. These viruses can be passed from person to person very easily (are very contagious). This condition may affect your stomach, small intestine, and large intestine. It can cause sudden watery diarrhea, fever, and vomiting.  Diarrhea and vomiting can make you feel weak and cause you to become dehydrated. You may not be able to keep fluids down. Dehydration can make you tired and thirsty, cause you to have a dry mouth, and decrease how often you urinate. Older adults and people with other diseases or a weak immune system are at higher risk for dehydration.  It is important to replace the fluids that you lose from diarrhea and vomiting. If you become severely dehydrated, you may need to get fluids through an IV tube.  What are the causes?  Gastroenteritis is caused by various viruses, including rotavirus and norovirus. Norovirus is the most common cause in adults.  You can get sick by eating food, drinking water, or touching a surface contaminated with one of these viruses. You can also get sick from sharing utensils or other personal items with an infected person.  What increases the risk?  This condition is more likely to develop in people:  · Who have a weak defense system (immune system).  · Who live with one or more children who are younger than 2 years old.  · Who live in a nursing home.  · Who go on cruise ships.  What are the signs or symptoms?  Symptoms of this condition start suddenly 1-2 days after exposure to a virus. Symptoms may last a few days or as long as a week. The most common symptoms are watery diarrhea and vomiting. Other symptoms include:  · Fever.  · Headache.  · Fatigue.  · Pain in the abdomen.  · Chills.  · Weakness.  · Nausea.  · Muscle aches.  · Loss of appetite.  How is this diagnosed?  This condition is diagnosed with a medical history and physical exam. You  may also have a stool test to check for viruses or other infections.  How is this treated?  This condition typically goes away on its own. The focus of treatment is to restore lost fluids (rehydration). Your health care provider may recommend that you take an oral rehydration solution (ORS) to replace important salts and minerals (electrolytes) in your body. Severe cases of this condition may require giving fluids through an IV tube.  Treatment may also include medicine to help with your symptoms.  Follow these instructions at home:    Follow instructions from your health care provider about how to care for yourself at home.  Follow these recommendations as told by your health care provider:  · Take an ORS. This is a drink that is sold at pharmacies and retail stores.  · Drink clear fluids in small amounts as you are able. Clear fluids include water, ice chips, diluted fruit juice, and low-calorie sports drinks.  · Eat bland, easy-to-digest foods in small amounts as you are able. These foods include bananas, applesauce, rice, lean meats, toast, and crackers.  · Avoid fluids that contain a lot of sugar or caffeine, such as energy drinks, sports drinks, and soda.  · Avoid alcohol.  · Avoid spicy or fatty foods.  General instructions  · Drink enough fluid to keep your urine clear or pale yellow.  · Wash your hands often.   If soap and water are not available, use hand sanitizer.  · Make sure that all people in your household wash their hands well and often.  · Take over-the-counter and prescription medicines only as told by your health care provider.  · Rest at home while you recover.  · Watch your condition for any changes.  · Take a warm bath to relieve any burning or pain from frequent diarrhea episodes.  · Keep all follow-up visits as told by your health care provider. This is important.  Contact a health care provider if:  · You cannot keep fluids down.  · Your symptoms get worse.  · You have new symptoms.  · You  feel light-headed or dizzy.  · You have muscle cramps.  Get help right away if:  · You have chest pain.  · You feel extremely weak or you faint.  · You see blood in your vomit.  · Your vomit looks like coffee grounds.  · You have bloody or black stools or stools that look like tar.  · You have a severe headache, a stiff neck, or both.  · You have a rash.  · You have severe pain, cramping, or bloating in your abdomen.  · You have trouble breathing or you are breathing very quickly.  · Your heart is beating very quickly.  · Your skin feels cold and clammy.  · You feel confused.  · You have pain when you urinate.  · You have signs of dehydration, such as:  ? Dark urine, very little urine, or no urine.  ? Cracked lips.  ? Dry mouth.  ? Sunken eyes.  ? Sleepiness.  ? Weakness.  This information is not intended to replace advice given to you by your health care provider. Make sure you discuss any questions you have with your health care provider.  Document Released: 05/23/2005 Document Revised: 01/05/2017 Document Reviewed: 01/27/2015  Elsevier Interactive Patient Education © 2019 [employer]

## 2018-08-06 NOTE — Progress Notes (Signed)
Established Patient Office Visit  Subjective:  Patient ID: Elizabeth Fleming, female    DOB: [birth year]  Age: 53 y.o. MRN: [id number]  CC:  Chief Complaint  Patient presents with  . Generalized Body Aches  . Diarrhea  . Emesis    HPI Elizabeth Fleming presents for evaluation treatment of 1 day history of elevated temperature, headache, nausea vomiting and watery diarrhea.  There are myalgias and a mild dry cough.  Patient denies nasal congestion postnasal drip sore throat.  She has tried fluids with caffeine.  Significant past medical history of coronary artery disease, hypertension and prediabetes.  Past Medical History:  Diagnosis Date  . Allergy   . CAD (coronary artery disease)    a. 03/2018 Cardiac CTA: Sev RCA dzs (FFR 0.74p/m, 0.66d); b. 04/2018 Cath/PCI: LM nl, LAD min irregs, D1/2 nl, LCX 20ost (initially 60%-->improved w/ IC ntg), OM1/2/3 nl, RCA 65ost (4.0x12 [place] DES), RPDA/RPAV nl, EF 55-65%.  . Colon polyps 2012  . Depression   . Genital warts   . Hyperlipidemia   . Hypertension   . Migraines   . Prediabetes     Past Surgical History:  Procedure Laterality Date  . CHOLECYSTECTOMY    . COLONOSCOPY WITH PROPOFOL N/A 11/03/2017   Procedure: COLONOSCOPY WITH PROPOFOL;  Surgeon: Virgel Manifold, MD;  Location: [facility];  Service: Endoscopy;  Laterality: N/A;  . INTRAVASCULAR ULTRASOUND/IVUS N/A 04/26/2018   Procedure: Intravascular Ultrasound/IVUS;  Surgeon: Nelva Bush, MD;  Location: [place] [facility];  Service: Cardiovascular;  Laterality: N/A;  . LEFT HEART CATH AND CORONARY ANGIOGRAPHY N/A 04/26/2018   Procedure: LEFT HEART CATH AND CORONARY ANGIOGRAPHY;  Surgeon: Nelva Bush, MD;  Location: [place] [facility];  Service: Cardiovascular;  Laterality: N/A;  . TONSILLECTOMY  1974    Family History  Adopted: Yes  Problem Relation Age of Onset  . Stroke Mother   . Coronary artery disease Sister 29  . Breast cancer Neg Hx     Social  History   Socioeconomic History  . Marital status: Married    Spouse name: Bari Edward  . Number of children: 2  . Years of education: 1  . Highest education level: Not on file  Occupational History  . Occupation: Engineer, maintenance: [place]  . Financial resource strain: Not on file  . Food insecurity:    Worry: Not on file    Inability: Not on file  . Transportation needs:    Medical: Not on file    Non-medical: Not on file  Tobacco Use  . Smoking status: Former Smoker    Packs/day: 1.00    Years: 15.00    Pack years: 15.00    Types: Cigarettes    Last attempt to quit: 05/2007    Years since quitting: 11.2  . Smokeless tobacco: Never Used  Substance and Sexual Activity  . Alcohol use: No    Alcohol/week: 0.0 standard drinks  . Drug use: No  . Sexual activity: Yes  Lifestyle  . Physical activity:    Days per week: Not on file    Minutes per session: Not on file  . Stress: Not on file  Relationships  . Social connections:    Talks on phone: Not on file    Gets together: Not on file    Attends religious service: Not on file    Active member of club or organization:  Not on file    Attends meetings of clubs or organizations: Not on file    Relationship status: Not on file  . Intimate partner violence:    Fear of current or ex partner: Not on file    Emotionally abused: Not on file    Physically abused: Not on file    Forced sexual activity: Not on file  Other Topics Concern  . Not on file  Social History Narrative  . Not on file    Outpatient Medications Prior to Visit  Medication Sig Dispense Refill  . albuterol (PROVENTIL HFA;VENTOLIN HFA) 108 (90 Base) MCG/ACT inhaler Inhale 2 puffs into the lungs every 6 (six) hours as needed. 1 Inhaler 0  . amLODipine (NORVASC) 2.5 MG tablet Take 1 tablet (2.5 mg total) by mouth daily. 90 tablet 2  . aspirin 81 MG chewable tablet Chew 1 tablet (81 mg  total) by mouth daily.    Marland Kitchen atenolol (TENORMIN) 50 MG tablet Take 1qd 90 tablet 3  . buPROPion (WELLBUTRIN XL) 150 MG 24 hr tablet Take 1 tablet (150 mg total) by mouth daily. 90 tablet 3  . cetirizine (ZYRTEC) 10 MG tablet Take 1 tablet (10 mg total) by mouth daily. 90 tablet 3  . clopidogrel (PLAVIX) 75 MG tablet Take 1 tablet (75 mg total) by mouth daily. 90 tablet 0  . cyclobenzaprine (FLEXERIL) 10 MG tablet Take 1 tablet (10 mg total) by mouth 3 (three) times daily as needed for muscle spasms. 30 tablet 0  . fish oil-omega-3 fatty acids 1000 MG capsule Take 1 g by mouth daily.      . fluticasone (FLONASE) 50 MCG/ACT nasal spray Dispense 3 inhalers 16 g PRN  . metFORMIN (GLUCOPHAGE) 500 MG tablet TAKE 1 TABLET BY MOUTH TWICE DAILY WITH A MEAL. 180 tablet 3  . atorvastatin (LIPITOR) 40 MG tablet Take 1 tablet (40 mg total) by mouth daily. 90 tablet 3  . nitroGLYCERIN (NITROSTAT) 0.4 MG SL tablet Place 1 tablet (0.4 mg total) under the tongue every 5 (five) minutes as needed for chest pain. Maximum of 3 doses. 25 tablet 3   No facility-administered medications prior to visit.     Allergies  Allergen Reactions  . Shellfish Allergy     ROS Review of Systems  Constitutional: Positive for fatigue. Negative for chills, diaphoresis, fever and unexpected weight change.  HENT: Negative.   Eyes: Negative for photophobia and visual disturbance.  Respiratory: Positive for cough. Negative for chest tightness and shortness of breath.   Cardiovascular: Negative.   Gastrointestinal: Positive for abdominal pain, diarrhea, nausea and vomiting. Negative for anal bleeding and blood in stool.  Endocrine: Negative for polyphagia and polyuria.  Genitourinary: Negative.   Musculoskeletal: Positive for myalgias.  Skin: Negative for pallor and rash.  Allergic/Immunologic: Negative for immunocompromised state.  Neurological: Positive for headaches. Negative for dizziness and light-headedness.    Hematological: Does not bruise/bleed easily.  Psychiatric/Behavioral: Negative.       Objective:    Physical Exam  Constitutional: She is oriented to person, place, and time. She appears well-developed and well-nourished.  HENT:  Head: Normocephalic and atraumatic.  Right Ear: External ear normal.  Left Ear: External ear normal.  Mouth/Throat: Oropharynx is clear and moist. No oropharyngeal exudate.  Eyes: Pupils are equal, round, and reactive to light. Conjunctivae are normal. Right eye exhibits no discharge. Left eye exhibits no discharge. No scleral icterus.  Neck: Neck supple. No JVD present. No tracheal deviation present. No thyromegaly present.  Cardiovascular: Normal rate, regular rhythm and normal heart sounds.  Pulmonary/Chest: Effort normal and breath sounds normal. No stridor.  Abdominal: Soft. Bowel sounds are normal. She exhibits no distension. There is abdominal tenderness. There is no rebound and no guarding.  Lymphadenopathy:    She has no cervical adenopathy.  Neurological: She is alert and oriented to person, place, and time.  Skin: Skin is warm and dry. She is not diaphoretic.  Psychiatric: She has a normal mood and affect. Her behavior is normal.    BP 110/70   Pulse 90   Temp 99.5 F (37.5 C) (Oral)   Ht 5\' 5"  (1.651 m)   SpO2 97%   BMI 39.94 kg/m  Wt Readings from Last 3 Encounters:  07/13/18 240 lb (108.9 kg)  05/28/18 252 lb (114.3 kg)  04/26/18 240 lb (108.9 kg)   BP Readings from Last 3 Encounters:  08/06/18 110/70  07/13/18 (!) 154/91  05/28/18 120/78   Guideline developer:  UpToDate (see UpToDate for funding source) Date Released: June 2014  Health Maintenance Due  Topic Date Due  . HIV Screening  07/20/1982  . INFLUENZA VACCINE  01/04/2018    There are no preventive care reminders to display for this patient.  Lab Results  Component Value Date   TSH 2.760 05/28/2018   Lab Results  Component Value Date   WBC 8.7 04/17/2018    HGB 12.3 04/17/2018   HCT 38.4 04/17/2018   MCV 87.9 04/17/2018   PLT 241 04/17/2018   Lab Results  Component Value Date   NA 140 05/28/2018   K 4.2 05/28/2018   CO2 21 05/28/2018   GLUCOSE 92 05/28/2018   BUN 10 05/28/2018   CREATININE 0.62 05/28/2018   BILITOT 0.6 05/28/2018   ALKPHOS 126 (H) 05/28/2018   AST 22 05/28/2018   ALT 25 05/28/2018   PROT 6.6 05/28/2018   ALBUMIN 4.2 05/28/2018   CALCIUM 8.8 05/28/2018   ANIONGAP 10 04/17/2018   GFR 81.83 09/13/2017   Lab Results  Component Value Date   CHOL 146 05/28/2018   Lab Results  Component Value Date   HDL 45 05/28/2018   Lab Results  Component Value Date   LDLCALC 63 05/28/2018   Lab Results  Component Value Date   TRIG 191 (H) 05/28/2018   Lab Results  Component Value Date   CHOLHDL 3.2 05/28/2018   Lab Results  Component Value Date   HGBA1C 5.6 09/13/2017      Assessment & Plan:   Problem List Items Addressed This Visit      Digestive   Gastroenteritis   Relevant Medications   promethazine (PHENERGAN) injection 25 mg (Completed)   promethazine (PHENERGAN) 25 MG tablet     Other   Fever - Primary   Relevant Orders   POC Influenza A&B(BINAX/QUICKVUE) (Completed)      Meds ordered this encounter  Medications  . promethazine (PHENERGAN) injection 25 mg  . promethazine (PHENERGAN) 25 MG tablet    Sig: Take 1 tablet (25 mg total) by mouth every 8 (eight) hours as needed for nausea or vomiting.    Dispense:  20 tablet    Refill:  0    Follow-up: Return in about 4 days (around 08/10/2018), or if symptoms worsen or fail to improve.   Stressed the importance of hydration with non-caffeinated fluids.  Discussed using the brat diet izzard appetite returns.  Follow-up if not improving by Wednesday or Thursday.  Information was given to the patient on  gastroenteritis
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
## 2018-08-10 ENCOUNTER — Ambulatory Visit (INDEPENDENT_AMBULATORY_CARE_PROVIDER_SITE_OTHER): Payer: BLUE CROSS/BLUE SHIELD | Admitting: Nurse Practitioner

## 2018-08-10 ENCOUNTER — Encounter: Payer: Self-pay | Admitting: Nurse Practitioner

## 2018-08-10 VITALS — BP 136/78 | HR 70 | Ht 65.0 in | Wt 251.0 lb

## 2018-08-10 DIAGNOSIS — I10 Essential (primary) hypertension: Secondary | ICD-10-CM | POA: Diagnosis not present

## 2018-08-10 DIAGNOSIS — I2510 Atherosclerotic heart disease of native coronary artery without angina pectoris: Secondary | ICD-10-CM

## 2018-08-10 DIAGNOSIS — E785 Hyperlipidemia, unspecified: Secondary | ICD-10-CM

## 2018-08-10 NOTE — Patient Instructions (Signed)
Medication Instructions:  Your physician recommends that you continue on your current medications as directed. Please refer to the Current Medication list given to you today.  If you need a refill on your cardiac medications before your next appointment, please call your pharmacy.   Lab work: None ordered  If you have labs (blood work) drawn today and your tests are completely normal, you will receive your results only by: Marland Kitchen MyChart Message (if you have MyChart) OR . A paper copy in the mail If you have any lab test that is abnormal or we need to change your treatment, we will call you to review the results.  Testing/Procedures: None ordered   Follow-Up: At [facility], you and your health needs are our priority.  As part of our continuing mission to provide you with exceptional heart care, we have created designated Provider Care Teams.  These Care Teams include your primary Cardiologist (physician) and Advanced Practice Providers (APPs -  Physician Assistants and Nurse Practitioners) who all work together to provide you with the care you need, when you need it. You will need a follow up appointment in 4-6 months.  Please see Nelva Bush, MD

## 2018-08-15 MED ORDER — AMLODIPINE BESYLATE 2.5 MG PO TABS: 2.5000 mg | ORAL_TABLET | Freq: Every day | ORAL | 3 refills | Status: DC

## 2018-08-30 ENCOUNTER — Encounter: Payer: Self-pay | Admitting: Family Medicine

## 2018-09-16 ENCOUNTER — Other Ambulatory Visit: Payer: Self-pay | Admitting: Internal Medicine

## 2018-09-16 ENCOUNTER — Other Ambulatory Visit: Payer: Self-pay | Admitting: Family Medicine

## 2018-09-24 ENCOUNTER — Telehealth: Payer: Self-pay
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
## 2018-10-08 ENCOUNTER — Encounter: Payer: Self-pay | Admitting: Family Medicine

## 2018-10-10 ENCOUNTER — Encounter: Payer: Self-pay | Admitting: Family Medicine

## 2018-10-25 ENCOUNTER — Other Ambulatory Visit: Payer: Self-pay | Admitting: Family Medicine

## 2018-10-25 DIAGNOSIS — Z1231 Encounter for screening mammogram for malignant neoplasm of breast: Secondary | ICD-10-CM

## 2018-11-05 ENCOUNTER — Encounter: Payer: Self-pay | Admitting: Family Medicine

## 2018-11-05 ENCOUNTER — Telehealth (INDEPENDENT_AMBULATORY_CARE_PROVIDER_SITE_OTHER): Payer: BLUE CROSS/BLUE SHIELD | Admitting: Family Medicine

## 2018-11-05 DIAGNOSIS — G4700 Insomnia, unspecified: Secondary | ICD-10-CM

## 2018-11-05 DIAGNOSIS — F418 Other specified anxiety disorders: Secondary | ICD-10-CM

## 2018-11-05 DIAGNOSIS — F4310 Post-traumatic stress disorder, unspecified: Secondary | ICD-10-CM | POA: Diagnosis not present

## 2018-11-05 MED ORDER — TRAZODONE HCL 50 MG PO TABS: 25.0000 mg | ORAL_TABLET | Freq: Every day | ORAL | 3 refills | Status: DC

## 2018-11-05 MED ORDER — ALPRAZOLAM 0.25 MG PO TABS: 0.2500 mg | ORAL_TABLET | Freq: Two times a day (BID) | ORAL | 0 refills | Status: AC | PRN
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
## 2018-11-05 NOTE — Assessment & Plan Note (Signed)
New- Elizabeth Fleming is exhibiting symptoms of PTSD.  We discussed treatment for this.  We decided against minipress given her cardiac disease and because she is no longer having flashbacks.  Discussed the importance of psychotherapy and I sent her names of therapists via mychart. She will update me with her symptoms and which therapist she gets in with. The patient indicates understanding of these issues and agrees with the plan.

## 2018-11-05 NOTE — Assessment & Plan Note (Signed)
>  25 minutes spent in face to face time with patient, >50% spent in counselling or coordination of care discussing anxiety, panic attacks, PTSD and insomnia.  The problem of recurrent insomnia is discussed. Avoidance of caffeine sources is strongly encouraged. Sleep hygiene issues are reviewed. eRx sent for trazodone 25- 50 mg nightly for insomnia. The patient indicates understanding of these issues and agrees with the plan.

## 2018-11-20 ENCOUNTER — Ambulatory Visit
Admission: RE | Admit: 2018-11-20 | Discharge: 2018-11-20 | Disposition: A | Discharge status: Home / Self Care | Payer: BC Managed Care – PPO | Source: Ambulatory Visit | Attending: Family Medicine | Admitting: Family Medicine

## 2018-11-20 ENCOUNTER — Other Ambulatory Visit: Payer: Self-pay

## 2018-11-20 DIAGNOSIS — Z1231 Encounter for screening mammogram for malignant neoplasm of breast: Secondary | ICD-10-CM | POA: Insufficient documentation

## 2018-12-10 ENCOUNTER — Other Ambulatory Visit: Payer: Self-pay | Admitting: Family Medicine

## 2018-12-12 ENCOUNTER — Ambulatory Visit: Payer: BLUE CROSS/BLUE SHIELD | Admitting: Internal Medicine

## 2018-12-24 ENCOUNTER — Other Ambulatory Visit: Payer: Self-pay

## 2018-12-24 MED ORDER — TRAZODONE HCL 50 MG PO TABS: 25.0000 mg | ORAL_TABLET | Freq: Every day | ORAL | 4 refills | Status: DC
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
## 2018-12-28 ENCOUNTER — Telehealth: Payer: Self-pay

## 2018-12-28 NOTE — Telephone Encounter (Signed)
Questions for Screening COVID-19  Symptom onset: None  Travel or Contacts: None  During this illness, did/does the patient experience any of the following symptoms? Fever >100.50F []   Yes [x]   No []   Unknown Subjective fever (felt feverish) []   Yes [x]   No []   Unknown Chills []   Yes []   No []   Unknown Muscle aches (myalgia) []   Yes [x]   No []   Unknown Runny nose (rhinorrhea) []   Yes [x]   No []   Unknown Sore throat []   Yes []   No []   Unknown Cough (new onset or worsening of chronic cough) []   Yes [x]   No []   Unknown Shortness of breath (dyspnea) []   Yes [x]   No []   Unknown Nausea or vomiting []   Yes [x]   No []   Unknown Headache []   Yes [x]   No []   Unknown Abdominal pain  []   Yes [x]   No []   Unknown Diarrhea (?3 loose/looser than normal stools/24hr period) []   Yes []   No []   Unknown Other, specify:  Patient risk factors: Smoker? []   Current []   Former []   Never If female, currently pregnant? []   Yes []   No  Patient Active Problem List   Diagnosis Date Noted  . PTSD (post-traumatic stress disorder) 11/05/2018  . Insomnia 11/05/2018  . Gastroenteritis 08/06/2018  . Fever 08/06/2018  . Abnormal cardiac CT angiography 04/26/2018  . Accelerating angina ([place]) 04/10/2018  . Palpitations 01/03/2018  . Personal history of colonic polyps   . Polyp of sigmoid colon   . Diverticulosis of large intestine without diverticulitis   . Hypertrophied anal papilla   . Intestinal lump   . Atypical chest pain 09/13/2017  . ETD (Eustachian tube dysfunction), bilateral 01/26/2017  . Pre-diabetes 05/17/2016  . Adjustment disorder 06/17/2014  . Irregular periods 08/12/2013  . Depression with anxiety 03/05/2013  . GERD 02/22/2010  . Morbid obesity ([place]) 12/01/2009  . INCONTINENCE, URGE 12/01/2009  . ALLERGIC RHINITIS 11/18/2007  . HLD (hyperlipidemia) 11/16/2007  . Essential hypertension 11/16/2007    Plan:  []   High risk for COVID-19 with red flags go to ED (with CP, SOB, weak/lightheaded,  or fever > 101.5). Call ahead.  []   High risk for COVID-19 but stable. Inform provider and coordinate time for [facility] visit.   []   No red flags but URI signs or symptoms okay for [facility] visit.

## 2018-12-30 DIAGNOSIS — I2510 Atherosclerotic heart disease of native coronary artery without angina pectoris: Secondary | ICD-10-CM | POA: Insufficient documentation

## 2018-12-30 DIAGNOSIS — Z0000 Encounter for general adult medical examination without abnormal findings: Secondary | ICD-10-CM | POA: Insufficient documentation

## 2018-12-30 NOTE — Assessment & Plan Note (Signed)
Followed by Dr. Merrilee Jansky.   Status post PCI and drug-eluting stent placement to the right coronary artery in November 2019.  Catheterization at that time also showed vasospasm in the left circumflex.   She remains on aspirin, beta-blocker, statin, Plavix and most recently amlodipine was added.  She did not previously tolerate nitrates secondary to headaches.

## 2018-12-31 ENCOUNTER — Encounter: Payer: Self-pay | Admitting: Family Medicine

## 2018-12-31 ENCOUNTER — Ambulatory Visit (INDEPENDENT_AMBULATORY_CARE_PROVIDER_SITE_OTHER): Payer: BC Managed Care – PPO | Admitting: Family Medicine

## 2018-12-31 VITALS — BP 122/80 | HR 67 | Temp 98.3°F | Ht 64.0 in | Wt 244.8 lb

## 2018-12-31 DIAGNOSIS — Z860100 Personal history of colon polyps, unspecified: Secondary | ICD-10-CM

## 2018-12-31 DIAGNOSIS — F418 Other specified anxiety disorders: Secondary | ICD-10-CM

## 2018-12-31 DIAGNOSIS — Z0000 Encounter for general adult medical examination without abnormal findings: Secondary | ICD-10-CM | POA: Diagnosis not present

## 2018-12-31 DIAGNOSIS — I200 Unstable angina: Secondary | ICD-10-CM

## 2018-12-31 DIAGNOSIS — E785 Hyperlipidemia, unspecified: Secondary | ICD-10-CM

## 2018-12-31 DIAGNOSIS — F4310 Post-traumatic stress disorder, unspecified: Secondary | ICD-10-CM

## 2018-12-31 DIAGNOSIS — G4700 Insomnia, unspecified: Secondary | ICD-10-CM

## 2018-12-31 DIAGNOSIS — I10 Essential (primary) hypertension: Secondary | ICD-10-CM

## 2018-12-31 DIAGNOSIS — I25110 Atherosclerotic heart disease of native coronary artery with unstable angina pectoris: Secondary | ICD-10-CM

## 2018-12-31 DIAGNOSIS — R7303 Prediabetes: Secondary | ICD-10-CM

## 2018-12-31 LAB — CBC WITH DIFFERENTIAL/PLATELET
Basophils Absolute: 0.1 10*3/uL (ref 0.0–0.1)
Basophils Relative: 1 % (ref 0.0–3.0)
Eosinophils Absolute: 0.5 10*3/uL (ref 0.0–0.7)
Eosinophils Relative: 7 % — ABNORMAL HIGH (ref 0.0–5.0)
HCT: 40.2 % (ref 36.0–46.0)
Hemoglobin: 13.2 g/dL (ref 12.0–15.0)
Lymphocytes Relative: 23.4 % (ref 12.0–46.0)
Lymphs Abs: 1.6 10*3/uL (ref 0.7–4.0)
MCHC: 32.9 g/dL (ref 30.0–36.0)
MCV: 86.7 fl (ref 78.0–100.0)
Monocytes Absolute: 0.5 10*3/uL (ref 0.1–1.0)
Monocytes Relative: 7.3 % (ref 3.0–12.0)
Neutro Abs: 4.1 10*3/uL (ref 1.4–7.7)
Neutrophils Relative %: 61.3 % (ref 43.0–77.0)
Platelets: 224 10*3/uL (ref 150.0–400.0)
RBC: 4.64 Mil/uL (ref 3.87–5.11)
RDW: 13.4 % (ref 11.5–15.5)
WBC: 6.7 10*3/uL (ref 4.0–10.5)

## 2018-12-31 LAB — COMPREHENSIVE METABOLIC PANEL
ALT: 24 U/L (ref 0–35)
AST: 25 U/L (ref 0–37)
Albumin: 4.4 g/dL (ref 3.5–5.2)
Alkaline Phosphatase: 99 U/L (ref 39–117)
BUN: 12 mg/dL (ref 6–23)
CO2: 29 mEq/L (ref 19–32)
Calcium: 10.3 mg/dL (ref 8.4–10.5)
Chloride: 101 mEq/L (ref 96–112)
Creatinine, Ser: 0.86 mg/dL (ref 0.40–1.20)
GFR: 69.44 mL/min (ref >60.00)
Glucose, Bld: 104 mg/dL — ABNORMAL HIGH (ref 70–99)
Potassium: 4.7 mEq/L (ref 3.5–5.1)
Sodium: 140 mEq/L (ref 135–145)
Total Bilirubin: 1.5 mg/dL — ABNORMAL HIGH (ref 0.2–1.2)
Total Protein: 7.5 g/dL (ref 6.0–8.3)

## 2018-12-31 LAB — LIPID PANEL
Cholesterol: 131 mg/dL (ref 0–200)
HDL: 41.5 mg/dL (ref >39.00)
NonHDL: 89.69
Total CHOL/HDL Ratio: 3
Triglycerides: 216 mg/dL — ABNORMAL HIGH (ref 0.0–149.0)
VLDL: 43.2 mg/dL — ABNORMAL HIGH (ref 0.0–40.0)

## 2018-12-31 LAB — HEMOGLOBIN A1C: Hgb A1c MFr Bld: 5.6 % (ref 4.6–6.5)

## 2018-12-31 LAB — LDL CHOLESTEROL, DIRECT: Direct LDL: 72 mg/dL

## 2018-12-31 LAB — TSH: TSH: 2.16 u[IU]/mL (ref 0.35–4.50)

## 2018-12-31 NOTE — Assessment & Plan Note (Signed)
Reviewed preventive care protocols, scheduled due services, and updated immunizations Discussed nutrition, exercise, diet, and healthy lifestyle.  

## 2018-12-31 NOTE — Patient Instructions (Addendum)
Great to see you. I will call you with your lab results from today and you can view them online.    Ask Dr. Nicolasa Ducking about minipress for PTSD/nightmares.

## 2018-12-31 NOTE — Assessment & Plan Note (Signed)
a1c today 

## 2018-12-31 NOTE — Assessment & Plan Note (Signed)
With insomnia.  Will not add any new rxs at this time as she is seeing Dr. Nicolasa Ducking in less than a month. I did ask her to ask her about minipress for PTSD/nightmares/flashbacks. The patient indicates understanding of these issues and agrees with the plan.

## 2018-12-31 NOTE — Assessment & Plan Note (Signed)
On statin and has been well controlled. Due for labs today.

## 2018-12-31 NOTE — Assessment & Plan Note (Signed)
Well controlled on current rxs. No changes made today.  Orders Placed This Encounter  Procedures  . Hemoglobin A1c  . Lipid panel  . Comprehensive metabolic panel  . CBC with Differential/Platelet  . TSH
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
## 2019-01-16 ENCOUNTER — Encounter: Payer: Self-pay | Admitting: Internal Medicine

## 2019-01-16 ENCOUNTER — Ambulatory Visit (INDEPENDENT_AMBULATORY_CARE_PROVIDER_SITE_OTHER): Payer: BC Managed Care – PPO | Admitting: Internal Medicine

## 2019-01-16 ENCOUNTER — Other Ambulatory Visit: Payer: Self-pay

## 2019-01-16 VITALS — BP 130/90 | HR 72 | Ht 65.0 in | Wt 246.0 lb

## 2019-01-16 DIAGNOSIS — I10 Essential (primary) hypertension: Secondary | ICD-10-CM | POA: Diagnosis not present

## 2019-01-16 NOTE — Patient Instructions (Signed)
Medication Instructions:  Your physician recommends that you continue on your current medications as directed. Please refer to the Current Medication list given to you today.  If you need a refill on your cardiac medications before your next appointment, please call your pharmacy.   Lab work: - None ordered.  If you have labs (blood work) drawn today and your tests are completely normal, you will receive your results only by: Marland Kitchen MyChart Message (if you have MyChart) OR . A paper copy in the mail If you have any lab test that is abnormal or we need to change your treatment, we will call you to review the results.  Testing/Procedures: - None ordered.   Follow-Up: At [facility], you and your health needs are our priority.  As part of our continuing mission to provide you with exceptional heart care, we have created designated Provider Care Teams.  These Care Teams include your primary Cardiologist (physician) and Advanced Practice Providers (APPs -  Physician Assistants and Nurse Practitioners) who all work together to provide you with the care you need, when you need it. You will need a follow up appointment in November 2020.  Please call our office 2 months in advance to schedule this appointment.  You may see Nelva Bush, MD or one of the following Advanced Practice Providers on your designated Care Team:   Murray Hodgkins, NP Christell Faith, PA-C . Marrianne Mood, PA-C

## 2019-01-17 ENCOUNTER — Encounter: Payer: Self-pay | Admitting: Internal Medicine

## 2019-01-22 DIAGNOSIS — F331 Major depressive disorder, recurrent, moderate: Secondary | ICD-10-CM | POA: Diagnosis not present

## 2019-01-22 DIAGNOSIS — F411 Generalized anxiety disorder: Secondary | ICD-10-CM | POA: Diagnosis not present

## 2019-01-22 DIAGNOSIS — F5105 Insomnia due to other mental disorder: Secondary | ICD-10-CM | POA: Diagnosis not present

## 2019-01-22 DIAGNOSIS — F4311 Post-traumatic stress disorder, acute: Secondary | ICD-10-CM | POA: Diagnosis not present

## 2019-01-28 ENCOUNTER — Encounter: Payer: Self-pay | Admitting: Family Medicine

## 2019-02-04 DIAGNOSIS — F4310 Post-traumatic stress disorder, unspecified: Secondary | ICD-10-CM | POA: Diagnosis not present

## 2019-02-13 DIAGNOSIS — F5105 Insomnia due to other mental disorder: Secondary | ICD-10-CM | POA: Diagnosis not present

## 2019-02-13 DIAGNOSIS — F331 Major depressive disorder, recurrent, moderate: Secondary | ICD-10-CM | POA: Diagnosis not present

## 2019-02-13 DIAGNOSIS — F4311 Post-traumatic stress disorder, acute: Secondary | ICD-10-CM | POA: Diagnosis not present

## 2019-02-13 DIAGNOSIS — F411 Generalized anxiety disorder: Secondary | ICD-10-CM | POA: Diagnosis not present

## 2019-02-15 DIAGNOSIS — F4310 Post-traumatic stress disorder, unspecified: Secondary | ICD-10-CM | POA: Diagnosis not present

## 2019-02-25 ENCOUNTER — Other Ambulatory Visit: Payer: Self-pay | Admitting: Family Medicine

## 2019-02-25 ENCOUNTER — Other Ambulatory Visit: Payer: Self-pay | Admitting: Internal Medicine

## 2019-03-01 DIAGNOSIS — F4310 Post-traumatic stress disorder, unspecified: Secondary | ICD-10-CM | POA: Diagnosis not present

## 2019-03-14 DIAGNOSIS — F4311 Post-traumatic stress disorder, acute: Secondary | ICD-10-CM | POA: Diagnosis not present

## 2019-03-14 DIAGNOSIS — F5105 Insomnia due to other mental disorder: Secondary | ICD-10-CM | POA: Diagnosis not present

## 2019-03-14 DIAGNOSIS — F331 Major depressive disorder, recurrent, moderate: Secondary | ICD-10-CM | POA: Diagnosis not present

## 2019-03-14 DIAGNOSIS — F411 Generalized anxiety disorder: Secondary | ICD-10-CM | POA: Diagnosis not present

## 2019-03-22 DIAGNOSIS — F4310 Post-traumatic stress disorder, unspecified: Secondary | ICD-10-CM | POA: Diagnosis not present

## 2019-04-05 DIAGNOSIS — F4310 Post-traumatic stress disorder, unspecified: Secondary | ICD-10-CM | POA: Diagnosis not present

## 2019-04-10 DIAGNOSIS — I2510 Atherosclerotic heart disease of native coronary artery without angina pectoris: Secondary | ICD-10-CM

## 2019-04-11 ENCOUNTER — Other Ambulatory Visit
Admission: RE | Admit: 2019-04-11 | Discharge: 2019-04-11 | Disposition: A | Discharge status: Home / Self Care | Payer: BC Managed Care – PPO | Attending: Internal Medicine | Admitting: Internal Medicine

## 2019-04-11 DIAGNOSIS — I2510 Atherosclerotic heart disease of native coronary artery without angina pectoris: Secondary | ICD-10-CM | POA: Insufficient documentation

## 2019-04-11 LAB — BASIC METABOLIC PANEL
Anion gap: 7 (ref 5–15)
BUN: 17 mg/dL (ref 6–20)
CO2: 28 mmol/L (ref 22–32)
Calcium: 9.1 mg/dL (ref 8.9–10.3)
Chloride: 105 mmol/L (ref 98–111)
Creatinine, Ser: 0.79 mg/dL (ref 0.44–1.00)
GFR calc Af Amer: >60 mL/min (ref >60)
GFR calc non Af Amer: >60 mL/min (ref >60)
Glucose, Bld: 109 mg/dL — ABNORMAL HIGH (ref 70–99)
Potassium: 4.9 mmol/L (ref 3.5–5.1)
Sodium: 140 mmol/L (ref 135–145)

## 2019-04-11 LAB — LIPID PANEL
Cholesterol: 148 mg/dL (ref 0–200)
HDL: 44 mg/dL (ref >40)
LDL Cholesterol: 76 mg/dL (ref 0–99)
Total CHOL/HDL Ratio: 3.4 RATIO
Triglycerides: 141 mg/dL (ref <150)
VLDL: 28 mg/dL (ref 0–40)

## 2019-04-11 LAB — HEPATIC FUNCTION PANEL
ALT: 23 U/L (ref 0–44)
AST: 28 U/L (ref 15–41)
Albumin: 4.2 g/dL (ref 3.5–5.0)
Alkaline Phosphatase: 100 U/L (ref 38–126)
Bilirubin, Direct: <0.1 mg/dL (ref 0.0–0.2)
Total Bilirubin: 1.1 mg/dL (ref 0.3–1.2)
Total Protein: 7.4 g/dL (ref 6.5–8.1)

## 2019-04-17 ENCOUNTER — Other Ambulatory Visit: Payer: Self-pay

## 2019-04-17 ENCOUNTER — Encounter: Payer: Self-pay | Admitting: Internal Medicine

## 2019-04-17 ENCOUNTER — Ambulatory Visit (INDEPENDENT_AMBULATORY_CARE_PROVIDER_SITE_OTHER): Payer: BC Managed Care – PPO | Admitting: Internal Medicine

## 2019-04-17 VITALS — BP 122/80 | HR 71 | Ht 65.0 in | Wt 238.5 lb

## 2019-04-17 DIAGNOSIS — I2510 Atherosclerotic heart disease of native coronary artery without angina pectoris: Secondary | ICD-10-CM | POA: Diagnosis not present

## 2019-04-17 DIAGNOSIS — E785 Hyperlipidemia, unspecified: Secondary | ICD-10-CM | POA: Diagnosis not present

## 2019-04-17 DIAGNOSIS — I10 Essential (primary) hypertension: Secondary | ICD-10-CM | POA: Diagnosis not present

## 2019-04-17 NOTE — Progress Notes (Signed)
Follow-up Outpatient Visit Date: 04/17/2019  Primary Care Provider: Lucille Passy, MD [place] [id number]  Chief Complaint: Follow-up coronary artery disease  HPI:  Ms. Herzog is a 53 y.o. year-old female with history of coronary artery disease s/p PCI to ostial RCA in 04/2018, hypertension, prediabetes, and depression, who presents for follow-up of coronary artery disease.  I last saw her in August, at which time she was feeling well with less shortness of breath.  We did not make any medication changes or pursue additional testing at that time.  Today, Ms. Matlick reports that she feels relatively well.  She experiences mild shortness of breath when it is humid outside.  If it is not so moist, she has no difficulty with her breathing.  She is exercising regularly but is disappointed that she has only lost 9 pounds since our last visit.  She denies chest pain, palpitations, and lightheadedness.  She notes occasional mild dependent edema, which is stable.  She notes that atorvastatin was keeping her up at night when she took this at bedtime.  Since taking this in the morning, this has resolved.  She also reports fairly frequent nosebleeds that can last up to 45 minutes before the bleeding stops.  She remains on aspirin and clopidogrel.  --------------------------------------------------------------------------------------------------  Past Medical History:  Diagnosis Date  . Allergy   . CAD (coronary artery disease)    a. 03/2018 Cardiac CTA: Sev RCA dzs (FFR 0.74p/m, 0.66d); b. 04/2018 Cath/PCI: LM nl, LAD min irregs, D1/2 nl, LCX 20ost (initially 60%-->improved w/ IC ntg), OM1/2/3 nl, RCA 65ost (4.0x12 [place] DES), RPDA/RPAV nl, EF 55-65%.  . Colon polyps 2012  . Depression   . Genital warts   . Hyperlipidemia   . Hypertension   . Migraines   . Prediabetes    Past Surgical History:  Procedure Laterality Date  . CHOLECYSTECTOMY    . COLONOSCOPY WITH  PROPOFOL N/A 11/03/2017   Procedure: COLONOSCOPY WITH PROPOFOL;  Surgeon: Virgel Manifold, MD;  Location: [facility] ENDOSCOPY;  Service: Endoscopy;  Laterality: N/A;  . INTRAVASCULAR ULTRASOUND/IVUS N/A 04/26/2018   Procedure: Intravascular Ultrasound/IVUS;  Surgeon: Nelva Bush, MD;  Location: [place] [facility];  Service: Cardiovascular;  Laterality: N/A;  . LEFT HEART CATH AND CORONARY ANGIOGRAPHY N/A 04/26/2018   Procedure: LEFT HEART CATH AND CORONARY ANGIOGRAPHY;  Surgeon: Nelva Bush, MD;  Location: [place] [facility];  Service: Cardiovascular;  Laterality: N/A;  . TONSILLECTOMY  1974    Current Meds  Medication Sig  . albuterol (PROVENTIL HFA;VENTOLIN HFA) 108 (90 Base) MCG/ACT inhaler Inhale 2 puffs into the lungs every 6 (six) hours as needed.  . ALPRAZolam (XANAX) 0.25 MG tablet Take 1 tablet (0.25 mg total) by mouth 2 (two) times daily as needed for anxiety.  Marland Kitchen amLODipine (NORVASC) 2.5 MG tablet Take 1 tablet (2.5 mg total) by mouth daily.  Marland Kitchen aspirin 81 MG chewable tablet Chew 1 tablet (81 mg total) by mouth daily.  Marland Kitchen atenolol (TENORMIN) 50 MG tablet TAKE 1 TABLET DAILY (DISCONTINUE PRESCRIPTION FOR 25 MG, START 50 MG)  . atorvastatin (LIPITOR) 40 MG tablet TAKE 1 TABLET DAILY  . cetirizine (ZYRTEC) 10 MG tablet Take 1 tablet (10 mg total) by mouth daily.  . fish oil-omega-3 fatty acids 1000 MG capsule Take 1 g by mouth daily.    . fluticasone (FLONASE) 50 MCG/ACT nasal spray Dispense 3 inhalers (Patient taking differently: daily. Dispense 3 inhalers)  . metFORMIN (GLUCOPHAGE) 500 MG tablet  TAKE 1 TABLET TWICE A DAY WITH MEALS  . nitroGLYCERIN (NITROSTAT) 0.4 MG SL tablet Place 1 tablet (0.4 mg total) under the tongue every 5 (five) minutes as needed for chest pain. Maximum of 3 doses.  Marland Kitchen sertraline (ZOLOFT) 50 MG tablet Take 50 mg by mouth daily.  . [DISCONTINUED] clopidogrel (PLAVIX) 75 MG tablet TAKE 1 TABLET DAILY    Allergies: Shellfish allergy  Social History    Tobacco Use  . Smoking status: Former Smoker    Packs/day: 1.00    Years: 15.00    Pack years: 15.00    Types: Cigarettes    Quit date: 05/2007    Years since quitting: 11.9  . Smokeless tobacco: Never Used  Substance Use Topics  . Alcohol use: No    Alcohol/week: 0.0 standard drinks  . Drug use: No    Family History  Adopted: Yes  Problem Relation Age of Onset  . Stroke Mother   . Coronary artery disease Sister 48  . Breast cancer Neg Hx     Review of Systems: A 12-system review of systems was performed and was negative except as noted in the HPI.  --------------------------------------------------------------------------------------------------  Physical Exam: BP 122/80 (BP Location: Left Arm, Patient Position: Sitting, Cuff Size: Normal)   Pulse 71   Ht 5\' 5"  (1.651 m)   Wt 238 lb 8 oz (108.2 kg)   SpO2 99%   BMI 39.69 kg/m   General: NAD. HEENT: No conjunctival pallor or scleral icterus.  Facemask in place. Neck: Supple without lymphadenopathy, thyromegaly, JVD, or HJR. Lungs: Normal work of breathing. Clear to auscultation bilaterally without wheezes or crackles. Heart: Regular rate and rhythm without murmurs, rubs, or gallops. Non-displaced PMI. Abd: Bowel sounds present. Soft, NT/ND without hepatosplenomegaly Ext: No lower extremity edema. Radial, PT, and DP pulses are 2+ bilaterally. Skin: Warm and dry without rash.  EKG: Normal sinus rhythm without abnormality.  Lab Results  Component Value Date   WBC 6.7 12/31/2018   HGB 13.2 12/31/2018   HCT 40.2 12/31/2018   MCV 86.7 12/31/2018   PLT 224.0 12/31/2018    Lab Results  Component Value Date   NA 140 04/11/2019   K 4.9 04/11/2019   CL 105 04/11/2019   CO2 28 04/11/2019   BUN 17 04/11/2019   CREATININE 0.79 04/11/2019   GLUCOSE 109 (H) 04/11/2019   ALT 23 04/11/2019    Lab Results  Component Value Date   CHOL 148 04/11/2019   HDL 44 04/11/2019   LDLCALC 76 04/11/2019   LDLDIRECT 72.0  12/31/2018   TRIG 141 04/11/2019   CHOLHDL 3.4 04/11/2019    --------------------------------------------------------------------------------------------------  ASSESSMENT AND PLAN: Coronary artery disease: Ms. Cofer has episodic shortness of breath primarily when exercising in humid environments.  I have a low suspicion that this is related to coronary insufficiency.  As she is almost 12 months out from her PCI to the ostial RCA, we have agreed to discontinue clopidogrel and continue with indefinite aspirin 81 mg daily.  We will continue with atorvastatin for secondary prevention.  Hypertension: Blood pressure reasonable, though DBP is borderline.  We will defer medication changes today.  I encouraged Ms. Fillinger to keep working on weight loss through diet and exercise as well as to minimize sodium consumption.  Hyperlipidemia: Recent lipid panel notable for LDL of 76, which is just above goal.  We discussed further escalation of lipid therapy but have agreed to perform work on lifestyle modifications first.  Follow-up: Return to clinic  in 6 months.  Nelva Bush, MD 04/18/2019 12:19 PM

## 2019-04-17 NOTE — Patient Instructions (Addendum)
Medication Instructions:  Your physician has recommended you make the following change in your medication:   STOP Plavix  Continue your other medications as precsribed  *If you need a refill on your cardiac medications before your next appointment, please call your pharmacy*  Lab Work: None ordered If you have labs (blood work) drawn today and your tests are completely normal, you will receive your results only by: Marland Kitchen MyChart Message (if you have MyChart) OR . A paper copy in the mail If you have any lab test that is abnormal or we need to change your treatment, we will call you to review the results.  Testing/Procedures: None ordered  Follow-Up: At [facility], you and your health needs are our priority.  As part of our continuing mission to provide you with exceptional heart care, we have created designated Provider Care Teams.  These Care Teams include your primary Cardiologist (physician) and Advanced Practice Providers (APPs -  Physician Assistants and Nurse Practitioners) who all work together to provide you with the care you need, when you need it.  Your next appointment:   6 months  The format for your next appointment:   In Person  Provider:    You may see Nelva Bush, MD or one of the following Advanced Practice Providers on your designated Care Team:    Murray Hodgkins, NP  Christell Faith, PA-C  Marrianne Mood, PA-C   Other Instructions N/A

## 2019-04-19 DIAGNOSIS — F4310 Post-traumatic stress disorder, unspecified: Secondary | ICD-10-CM | POA: Diagnosis not present

## 2019-05-08 DIAGNOSIS — F331 Major depressive disorder, recurrent, moderate: Secondary | ICD-10-CM | POA: Diagnosis not present

## 2019-05-08 DIAGNOSIS — F411 Generalized anxiety disorder: Secondary | ICD-10-CM | POA: Diagnosis not present

## 2019-05-08 DIAGNOSIS — F5105 Insomnia due to other mental disorder: Secondary | ICD-10-CM | POA: Diagnosis not present

## 2019-05-08 DIAGNOSIS — F4311 Post-traumatic stress disorder, acute: Secondary | ICD-10-CM | POA: Diagnosis not present

## 2019-05-10 DIAGNOSIS — F4310 Post-traumatic stress disorder, unspecified: Secondary | ICD-10-CM | POA: Diagnosis not present

## 2019-05-13 ENCOUNTER — Other Ambulatory Visit: Payer: Self-pay | Admitting: Internal Medicine

## 2019-06-12 DIAGNOSIS — F4310 Post-traumatic stress disorder, unspecified: Secondary | ICD-10-CM | POA: Diagnosis not present

## 2019-06-26 ENCOUNTER — Encounter: Payer: Self-pay | Admitting: Family Medicine

## 2019-07-02 ENCOUNTER — Encounter: Payer: Self-pay | Admitting: Family Medicine

## 2019-07-23 DIAGNOSIS — F4310 Post-traumatic stress disorder, unspecified: Secondary | ICD-10-CM | POA: Diagnosis not present

## 2019-08-08 DIAGNOSIS — F4311 Post-traumatic stress disorder, acute: Secondary | ICD-10-CM | POA: Diagnosis not present

## 2019-08-08 DIAGNOSIS — F411 Generalized anxiety disorder: Secondary | ICD-10-CM | POA: Diagnosis not present

## 2019-08-08 DIAGNOSIS — F331 Major depressive disorder, recurrent, moderate: Secondary | ICD-10-CM | POA: Diagnosis not present

## 2019-08-08 DIAGNOSIS — F5105 Insomnia due to other mental disorder: Secondary | ICD-10-CM | POA: Diagnosis not present

## 2019-08-20 DIAGNOSIS — F4310 Post-traumatic stress disorder, unspecified: Secondary | ICD-10-CM | POA: Diagnosis not present

## 2019-08-28 ENCOUNTER — Ambulatory Visit (INDEPENDENT_AMBULATORY_CARE_PROVIDER_SITE_OTHER): Payer: BC Managed Care – PPO | Admitting: Family Medicine

## 2019-08-28 ENCOUNTER — Encounter: Payer: Self-pay | Admitting: Family Medicine

## 2019-08-28 ENCOUNTER — Other Ambulatory Visit: Payer: Self-pay

## 2019-08-28 VITALS — BP 118/62 | HR 62 | Temp 97.3°F | Ht 65.0 in | Wt 244.2 lb

## 2019-08-28 DIAGNOSIS — I10 Essential (primary) hypertension: Secondary | ICD-10-CM

## 2019-08-28 DIAGNOSIS — F418 Other specified anxiety disorders: Secondary | ICD-10-CM | POA: Diagnosis not present

## 2019-08-28 DIAGNOSIS — F4310 Post-traumatic stress disorder, unspecified: Secondary | ICD-10-CM | POA: Diagnosis not present

## 2019-08-28 DIAGNOSIS — I2510 Atherosclerotic heart disease of native coronary artery without angina pectoris: Secondary | ICD-10-CM

## 2019-08-28 MED ORDER — AMLODIPINE BESYLATE 2.5 MG PO TABS: 2.5000 mg | ORAL_TABLET | Freq: Every day | ORAL | 3 refills | Status: DC
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
## 2019-08-28 NOTE — Assessment & Plan Note (Signed)
Hx of stent and left heart cath. Follows with Dr. Saunders Revel, last visit he noted BP borderline 122/80 and that LDL was a little above goal of 70. Return in 6 months per his note and indefinite ASA but dual therapy stopped. Advised return for 6 month visit. BP good today. If she continues to be stable could prescribe statin and have cardiology f/u as needed.

## 2019-08-28 NOTE — Patient Instructions (Addendum)
#  Leg cramping - Vit D -- [id number] units daily   -- If not improvement could also try conenzyme q10 300 mg daily   Return for annual exam

## 2019-08-28 NOTE — Assessment & Plan Note (Signed)
Cont medication. BP at goal
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
## 2019-08-28 NOTE — Assessment & Plan Note (Signed)
PHQ-9 slightly elevated today, but patient reports overall feeling well. Work stress is improving.

## 2019-10-06 ENCOUNTER — Other Ambulatory Visit: Payer: Self-pay | Admitting: Internal Medicine

## 2019-10-16 ENCOUNTER — Ambulatory Visit: Payer: BC Managed Care – PPO | Admitting: Internal Medicine

## 2019-10-18 ENCOUNTER — Other Ambulatory Visit: Payer: Self-pay

## 2019-10-18 ENCOUNTER — Other Ambulatory Visit
Admission: RE | Admit: 2019-10-18 | Discharge: 2019-10-18 | Disposition: A | Discharge status: Home / Self Care | Payer: BC Managed Care – PPO | Source: Ambulatory Visit | Attending: Family Medicine | Admitting: Family Medicine

## 2019-10-18 ENCOUNTER — Telehealth (INDEPENDENT_AMBULATORY_CARE_PROVIDER_SITE_OTHER): Payer: BC Managed Care – PPO | Admitting: Family Medicine

## 2019-10-18 ENCOUNTER — Encounter: Payer: Self-pay | Admitting: Family Medicine

## 2019-10-18 DIAGNOSIS — R300 Dysuria: Secondary | ICD-10-CM

## 2019-10-18 LAB — URINALYSIS, ROUTINE W REFLEX MICROSCOPIC
Bilirubin Urine: NEGATIVE
Glucose, UA: NEGATIVE mg/dL
Hgb urine dipstick: NEGATIVE
Ketones, ur: NEGATIVE mg/dL
Leukocytes,Ua: NEGATIVE
Nitrite: NEGATIVE
Protein, ur: NEGATIVE mg/dL
Specific Gravity, Urine: 1.003 — ABNORMAL LOW (ref 1.005–1.030)
pH: 5 (ref 5.0–8.0)

## 2019-10-18 MED ORDER — CEPHALEXIN 250 MG PO CAPS: 250.0000 mg | ORAL_CAPSULE | Freq: Two times a day (BID) | ORAL | 0 refills | Status: DC

## 2019-10-18 NOTE — Progress Notes (Signed)
Virtual Visit via Video Note  I connected with [name] on 10/18/19 at  3:00 PM EDT by a video enabled telemedicine application and verified that I am speaking with the correct person using two identifiers.  Location: Patient: work Provider: office   I discussed the limitations of evaluation and management by telemedicine and the availability of in person appointments. The patient expressed understanding and agreed to proceed.  Parties involved in encounter  Patient: Elizabeth Fleming   Provider:  Loura Pardon MD    History of Present Illness: Pt presents with urinary symptoms   Last 2 days sitting at desk  Feels some urinary burning  Frequency and urgency  Bloating over bladder   More pain to urinate  Urine is dilute- drinking water  Can of diet soda tid   No coffee or tea right now   Abdomen is not tender /no suprapubic tenderness No nausea No fever    UA: Results for orders placed or performed during the hospital encounter of 10/18/19  Urinalysis, Routine w reflex microscopic  Result Value Ref Range   Color, Urine COLORLESS (A) YELLOW   APPearance CLEAR (A) CLEAR   Specific Gravity, Urine 1.003 (L) 1.005 - 1.030   pH 5.0 5.0 - 8.0   Glucose, UA NEGATIVE NEGATIVE mg/dL   Hgb urine dipstick NEGATIVE NEGATIVE   Bilirubin Urine NEGATIVE NEGATIVE   Ketones, ur NEGATIVE NEGATIVE mg/dL   Protein, ur NEGATIVE NEGATIVE mg/dL   Nitrite NEGATIVE NEGATIVE   Leukocytes,Ua NEGATIVE NEGATIVE     no hx of utis (had them years ago)   Does use a hot tub about once per week    Patient Active Problem List   Diagnosis Date Noted  . Dysuria 10/18/2019  . CAD (coronary artery disease) 12/30/2018  . Well woman exam without gynecological exam 12/30/2018  . PTSD (post-traumatic stress disorder) 11/05/2018  . Insomnia 11/05/2018  . Gastroenteritis 08/06/2018  . Abnormal cardiac CT angiography 04/26/2018  . Accelerating angina ([place]) 04/10/2018  . Palpitations 01/03/2018   . Personal history of colonic polyps   . Polyp of sigmoid colon   . Diverticulosis of large intestine without diverticulitis   . Hypertrophied anal papilla   . Intestinal lump   . ETD (Eustachian tube dysfunction), bilateral 01/26/2017  . Pre-diabetes 05/17/2016  . Adjustment disorder 06/17/2014  . Irregular periods 08/12/2013  . Depression with anxiety 03/05/2013  . GERD 02/22/2010  . Morbid obesity ([place]) 12/01/2009  . INCONTINENCE, URGE 12/01/2009  . ALLERGIC RHINITIS 11/18/2007  . HLD (hyperlipidemia) 11/16/2007  . Essential hypertension 11/16/2007   Past Medical History:  Diagnosis Date  . Allergy   . CAD (coronary artery disease)    a. 03/2018 Cardiac CTA: Sev RCA dzs (FFR 0.74p/m, 0.66d); b. 04/2018 Cath/PCI: LM nl, LAD min irregs, D1/2 nl, LCX 20ost (initially 60%-->improved w/ IC ntg), OM1/2/3 nl, RCA 65ost (4.0x12 [place] DES), RPDA/RPAV nl, EF 55-65%.  . Colon polyps 2012  . Depression   . Genital warts   . Hyperlipidemia   . Hypertension   . Migraines   . Prediabetes    Past Surgical History:  Procedure Laterality Date  . CHOLECYSTECTOMY    . COLONOSCOPY WITH PROPOFOL N/A 11/03/2017   Procedure: COLONOSCOPY WITH PROPOFOL;  Surgeon: Virgel Manifold, MD;  Location: [facility] ENDOSCOPY;  Service: Endoscopy;  Laterality: N/A;  . FOOT SURGERY     right-removal of some calcifications between the big toe and second toe  . HAMMER TOE SURGERY  right  . INTRAVASCULAR ULTRASOUND/IVUS N/A 04/26/2018   Procedure: Intravascular Ultrasound/IVUS;  Surgeon: Nelva Bush, MD;  Location: [place] [facility];  Service: Cardiovascular;  Laterality: N/A;  . LEFT HEART CATH AND CORONARY ANGIOGRAPHY N/A 04/26/2018   Procedure: LEFT HEART CATH AND CORONARY ANGIOGRAPHY;  Surgeon: Nelva Bush, MD;  Location: [place] [facility];  Service: Cardiovascular;  Laterality: N/A;  . TONSILLECTOMY  1974   Social History   Tobacco Use  . Smoking status: Former Smoker    Packs/day:  1.00    Years: 15.00    Pack years: 15.00    Types: Cigarettes    Quit date: 05/2007    Years since quitting: 12.4  . Smokeless tobacco: Never Used  Substance Use Topics  . Alcohol use: No    Alcohol/week: 0.0 standard drinks  . Drug use: No   Family History  Adopted: Yes  Problem Relation Age of Onset  . Stroke Mother   . Coronary artery disease Sister 102       1/2 sister  . Breast cancer Neg Hx    Allergies  Allergen Reactions  . Shellfish Allergy    Current Outpatient Medications on File Prior to Visit  Medication Sig Dispense Refill  . albuterol (PROVENTIL HFA;VENTOLIN HFA) 108 (90 Base) MCG/ACT inhaler Inhale 2 puffs into the lungs every 6 (six) hours as needed. 1 Inhaler 0  . ALPRAZolam (XANAX) 0.25 MG tablet Take 1 tablet (0.25 mg total) by mouth 2 (two) times daily as needed for anxiety. 30 tablet 0  . aspirin 81 MG chewable tablet Chew 1 tablet (81 mg total) by mouth daily.    Marland Kitchen atenolol (TENORMIN) 50 MG tablet TAKE 1 TABLET DAILY (DISCONTINUE PRESCRIPTION FOR 25 MG, START 50 MG) 90 tablet 3  . atorvastatin (LIPITOR) 40 MG tablet TAKE 1 TABLET DAILY 90 tablet 2  . cetirizine (ZYRTEC) 10 MG tablet Take 1 tablet (10 mg total) by mouth daily. 90 tablet 3  . fish oil-omega-3 fatty acids 1000 MG capsule Take 1 g by mouth daily.      . fluticasone (FLONASE) 50 MCG/ACT nasal spray Dispense 3 inhalers (Patient taking differently: daily. Dispense 3 inhalers) 16 g PRN  . metFORMIN (GLUCOPHAGE) 500 MG tablet TAKE 1 TABLET TWICE A DAY WITH MEALS 180 tablet 3  . sertraline (ZOLOFT) 50 MG tablet Take 50 mg by mouth daily.    Marland Kitchen amLODipine (NORVASC) 2.5 MG tablet Take 1 tablet (2.5 mg total) by mouth daily. (Patient not taking: Reported on 10/18/2019) 90 tablet 3  . nitroGLYCERIN (NITROSTAT) 0.4 MG SL tablet Place 1 tablet (0.4 mg total) under the tongue every 5 (five) minutes as needed for chest pain. Maximum of 3 doses. (Patient not taking: Reported on 08/28/2019) 25 tablet 3   No  current facility-administered medications on file prior to visit.   Review of Systems  Constitutional: Negative for chills, fever and malaise/fatigue.  HENT: Negative for congestion, ear pain, sinus pain and sore throat.   Eyes: Negative for blurred vision, discharge and redness.  Respiratory: Negative for cough, shortness of breath and stridor.   Cardiovascular: Negative for chest pain, palpitations and leg swelling.  Gastrointestinal: Negative for abdominal pain, diarrhea, nausea and vomiting.  Genitourinary: Positive for dysuria and frequency. Negative for flank pain and hematuria.  Musculoskeletal: Negative for myalgias.  Skin: Negative for rash.  Neurological: Negative for dizziness and headaches.    Observations/Objective: Patient appears well, in no distress Weight is baseline  No facial swelling or  asymmetry Normal voice-not hoarse and no slurred speech No obvious tremor or mobility impairment Moving neck and UEs normally Able to hear the call well  No cough or shortness of breath during interview  Talkative and mentally sharp with no cognitive changes No skin changes on face or neck , no rash or pallor Affect is normal    Assessment and Plan: Problem List Items Addressed This Visit      Other   Dysuria    ua is clear and not concentrated  Disc poss of urethritis or bladder irritation  Enc water intake Hold other beverages  Hold off on hot tub or baths  Do not use detergent in genital area  Call/update if worse or new symptoms  (given keflex to hold/take if symptoms worsen before cx return) Urine culture pending           Follow Up Instructions: Drink lots of water  Avoid other beverages Avoid hot tub and bath for now  Pending urine culture -will update you  If symptoms suddenly worsen or change please call    I discussed the assessment and treatment plan with the patient. The patient was provided an opportunity to ask questions and all were answered. The  patient agreed with the plan and demonstrated an understanding of the instructions.   The patient was advised to call back or seek an in-person evaluation if the symptoms worsen or if the condition fails to improve as anticipated.     Loura Pardon, MD

## 2019-10-18 NOTE — Telephone Encounter (Signed)
Spoke with patient and scheduled for 3 pm with Dr Glori Bickers virtual with urine orders to be done at her work at [facility]

## 2019-10-18 NOTE — Addendum Note (Signed)
Addended by: Santiago Bur on: 10/18/2019 12:05 PM   Modules accepted: Orders

## 2019-10-18 NOTE — Progress Notes (Signed)
Orders for UA and urine culture for patient to have this done at work at [facility]. Ok per Dr Glori Bickers. Patient will have virtual visit with Dr Glori Bickers at 3 pm today.

## 2019-10-19 LAB — URINE CULTURE: Culture: <10000 — AB

## 2019-10-19 NOTE — Patient Instructions (Signed)
Drink lots of water  Avoid other beverages Avoid hot tub and bath for now  Pending urine culture -will update you  If symptoms suddenly worsen or change please call

## 2019-10-19 NOTE — Assessment & Plan Note (Signed)
ua is clear and not concentrated  Disc poss of urethritis or bladder irritation  Enc water intake Hold other beverages  Hold off on hot tub or baths  Do not use detergent in genital area  Call/update if worse or new symptoms  (given keflex to hold/take if symptoms worsen before cx return) Urine culture pending

## 2019-11-13 ENCOUNTER — Other Ambulatory Visit: Payer: Self-pay | Admitting: Family Medicine

## 2019-11-13 DIAGNOSIS — Z1231 Encounter for screening mammogram for malignant neoplasm of breast: Secondary | ICD-10-CM

## 2019-12-13 ENCOUNTER — Ambulatory Visit
Admission: RE | Admit: 2019-12-13 | Discharge: 2019-12-13 | Disposition: A | Discharge status: Home / Self Care | Payer: BC Managed Care – PPO | Source: Ambulatory Visit | Attending: Family Medicine | Admitting: Family Medicine

## 2019-12-13 DIAGNOSIS — Z1231 Encounter for screening mammogram for malignant neoplasm of breast: Secondary | ICD-10-CM | POA: Diagnosis not present

## 2020-01-03 ENCOUNTER — Encounter: Payer: Self-pay | Admitting: Family Medicine

## 2020-01-06 ENCOUNTER — Other Ambulatory Visit: Payer: Self-pay

## 2020-01-06 MED ORDER — SERTRALINE HCL 50 MG PO TABS: 50.0000 mg | ORAL_TABLET | Freq: Every day | ORAL | 0 refills | Status: DC

## 2020-01-06 MED ORDER — ATORVASTATIN CALCIUM 40 MG PO TABS: 40.0000 mg | ORAL_TABLET | Freq: Every day | ORAL | 2 refills | Status: DC

## 2020-02-17 ENCOUNTER — Other Ambulatory Visit: Payer: Self-pay

## 2020-02-17 ENCOUNTER — Ambulatory Visit (INDEPENDENT_AMBULATORY_CARE_PROVIDER_SITE_OTHER): Payer: BC Managed Care – PPO | Admitting: Family Medicine

## 2020-02-17 ENCOUNTER — Encounter: Payer: Self-pay | Admitting: Family Medicine

## 2020-02-17 VITALS — BP 128/80 | HR 67 | Temp 97.6°F | Ht 63.75 in | Wt 260.5 lb

## 2020-02-17 DIAGNOSIS — Z1159 Encounter for screening for other viral diseases: Secondary | ICD-10-CM

## 2020-02-17 DIAGNOSIS — I10 Essential (primary) hypertension: Secondary | ICD-10-CM | POA: Diagnosis not present

## 2020-02-17 DIAGNOSIS — Z23 Encounter for immunization: Secondary | ICD-10-CM

## 2020-02-17 DIAGNOSIS — L853 Xerosis cutis: Secondary | ICD-10-CM | POA: Diagnosis not present

## 2020-02-17 DIAGNOSIS — Z0000 Encounter for general adult medical examination without abnormal findings: Secondary | ICD-10-CM | POA: Diagnosis not present

## 2020-02-17 DIAGNOSIS — E782 Mixed hyperlipidemia: Secondary | ICD-10-CM

## 2020-02-17 DIAGNOSIS — R7303 Prediabetes: Secondary | ICD-10-CM

## 2020-02-17 LAB — COMPREHENSIVE METABOLIC PANEL
ALT: 26 U/L (ref 0–35)
AST: 26 U/L (ref 0–37)
Albumin: 4.4 g/dL (ref 3.5–5.2)
Alkaline Phosphatase: 110 U/L (ref 39–117)
BUN: 16 mg/dL (ref 6–23)
CO2: 31 mEq/L (ref 19–32)
Calcium: 9.7 mg/dL (ref 8.4–10.5)
Chloride: 102 mEq/L (ref 96–112)
Creatinine, Ser: 0.79 mg/dL (ref 0.40–1.20)
GFR: 76.25 mL/min (ref >60.00)
Glucose, Bld: 102 mg/dL — ABNORMAL HIGH (ref 70–99)
Potassium: 5.1 mEq/L (ref 3.5–5.1)
Sodium: 141 mEq/L (ref 135–145)
Total Bilirubin: 1.3 mg/dL — ABNORMAL HIGH (ref 0.2–1.2)
Total Protein: 7.1 g/dL (ref 6.0–8.3)

## 2020-02-17 LAB — LIPID PANEL
Cholesterol: 149 mg/dL (ref 0–200)
HDL: 42.9 mg/dL (ref >39.00)
LDL Cholesterol: 71 mg/dL (ref 0–99)
NonHDL: 106.19
Total CHOL/HDL Ratio: 3
Triglycerides: 174 mg/dL — ABNORMAL HIGH (ref 0.0–149.0)
VLDL: 34.8 mg/dL (ref 0.0–40.0)

## 2020-02-17 LAB — CBC
HCT: 38.6 % (ref 36.0–46.0)
Hemoglobin: 12.6 g/dL (ref 12.0–15.0)
MCHC: 32.7 g/dL (ref 30.0–36.0)
MCV: 88 fl (ref 78.0–100.0)
Platelets: 223 10*3/uL (ref 150.0–400.0)
RBC: 4.38 Mil/uL (ref 3.87–5.11)
RDW: 13.8 % (ref 11.5–15.5)
WBC: 7.4 10*3/uL (ref 4.0–10.5)

## 2020-02-17 LAB — HEMOGLOBIN A1C: Hgb A1c MFr Bld: 5.8 % (ref 4.6–6.5)

## 2020-02-17 LAB — TSH: TSH: 3.32 u[IU]/mL (ref 0.35–4.50)

## 2020-02-17 NOTE — Patient Instructions (Addendum)
Continue to work on healthy eating and exercise  Menopause Menopause may increase your risk for:  Loss of bone (osteoporosis), which causes bone breaks (fractures).  Depression.  Hardening and narrowing of the arteries (atherosclerosis), which can cause heart attacks and strokes. What are the causes? This condition is usually caused by a natural change in hormone levels that happens as you get older. The condition may also be caused by surgery to remove both ovaries (bilateral oophorectomy). Follow these instructions at home: Lifestyle  Do not use any products that contain nicotine or tobacco, such as cigarettes and e-cigarettes. If you need help quitting, ask your health care provider.  Get at least 30 minutes of physical activity on 5 or more days each week.  Avoid alcoholic and caffeinated beverages, as well as spicy foods. This may help prevent hot flashes.  Get 7-8 hours of sleep each night.  If you have hot flashes, try: ? Dressing in layers. ? Avoiding things that may trigger hot flashes, such as spicy food, hot drinks, alcohol, caffeine, warm places, or stress. ? Taking slow, deep breaths when a hot flash starts. ? Keeping a fan in your home and office.  Find ways to manage stress: regular exercise, meditation, yoga, qigong, Tai Chi, biofeedback, acupuncture or massage  Consider going to group therapy with other women who are having menopause symptoms. Ask your health care provider about recommended group therapy meetings.  Staying cool while sleeping: dress in light clothing, use layed bedding that can be easily removed, Sleep with a fan nearby, put an ice pack under your pillow and flip your pillow regularly  Eating and drinking  Eat a healthy, balanced diet that contains whole grains, lean protein, low-fat dairy, and plenty of fruits and vegetables.  Your health care provider may recommend adding more soy to your diet. Foods that contain soy include tofu, tempeh, and  soy milk.  Eat plenty of foods that contain calcium and vitamin D for bone health. Items that are rich in calcium include low-fat milk, yogurt, beans, almonds, sardines, broccoli, and kale. Medicines  Non-prescription medications for Hot Flashes ? Soy - eat 1-2 servings of soy foods daily ? Herbs: like black cohosh have shown some improvement with hot flashes  Talk with your health care provider before starting any herbal supplements. If prescribed, take vitamins and supplements as told by your health care provider. These may include: ? Calcium. Women age 52 and older should get 1,200 mg (milligrams) of calcium every day. ? Vitamin D. Women need 600-800 International Units of vitamin D each day.  Prescription Medications ? Hormone Treatment: increased risk of breast cancer and cardiovascular disease. Should be used for a short time and in women under 60 have a lower risk overall if they take it ? Non-Hormone Treatment: Paroxetine which is an antidepressant, has been shown to reduce Hot Flashes

## 2020-02-17 NOTE — Progress Notes (Signed)
Annual Exam   Chief Complaint:  Chief Complaint  Patient presents with  . Annual Exam    History of Present Illness:  [name] is a 54 y.o. No obstetric history on file. who LMP was Patient's last menstrual period was 12/24/2017 (exact date)., presents today for her annual examination.    Covid vaccine - Pfizer in February/March  Dryness - skin, vaginal eyes, fingers, nail beds Started body armor light - water drink  Nutrition She does not get adequate calcium and Vitamin D in her diet. Diet: planning to eat yogurt daily in the AM, every day is different - fair diet Exercise: gardening regularly - yard work  Engineer, materials The patient wears seatbelts: yes.     The patient feels safe at home and in their relationships: yes.   Menstrual:  Symptoms of menopause: dry skin, vaginal dryness Last period - unknown at least 1 year   GYN She is not sexually active.    Cervical Cancer Screening (21-65):   Last Pap:   April 2019 Results were: no abnormalities /neg HPV DNA     Breast Cancer Screening (Age 15-74):  There is no FH of breast cancer. There is no FH of ovarian cancer. BRCA screening Not Indicated.  Last Mammogram: 12/13/2019 The patient does want a mammogram this year.    Colon Cancer Screening:  Age 49-75 yo - benefits outweigh the risk. Adults 71-85 yo who have never been screened benefit.  Benefits: 134000 people in 2016 will be diagnosed and 49,000 will die - early detection helps [place]: Complications 2/2 to colonoscopy High Risk (Colonoscopy): genetic disorder (Lynch syndrome or familial adenomatous polyposis), personal hx of IBD, previous adenomatous polyp, or previous colorectal cancer, FamHx start 10 years before the age at diagnosis, increased in males and black race  Options:  FIT - looks for hemoglobin (blood in the stool) - specific and fairly sensitive - must be done annually Cologuard - looks for DNA and blood - more sensitive - therefore can have  more false positives, every 3 years Colonoscopy - every 10 years if normal - sedation, bowl prep, must have someone drive you  Shared decision making and the patient had decided to do - up to date on colonoscopy.   Social History   Tobacco Use  Smoking Status Former Smoker  . Packs/day: 1.00  . Years: 15.00  . Pack years: 15.00  . Types: Cigarettes  . Quit date: 05/2007  . Years since quitting: 12.7  Smokeless Tobacco Never Used    Lung Cancer Screening (Ages 85-88): not applicable   Weight Wt Readings from Last 3 Encounters:  02/17/20 260 lb 8 oz (118.2 kg)  08/28/19 244 lb 4 oz (110.8 kg)  04/17/19 238 lb 8 oz (108.2 kg)   Patient has very high BMI  BMI Readings from Last 1 Encounters:  02/17/20 45.07 kg/m     Chronic disease screening Blood pressure monitoring:  BP Readings from Last 3 Encounters:  02/17/20 128/80  08/28/19 118/62  04/17/19 122/80    Lipid Monitoring: Indication for screening: age >58, obesity, diabetes, family hx, CV risk factors.  Lipid screening: Yes  Lab Results  Component Value Date   CHOL 148 04/11/2019   HDL 44 04/11/2019   [place] 76 04/11/2019   LDLDIRECT 72.0 12/31/2018   TRIG 141 04/11/2019   CHOLHDL 3.4 04/11/2019     Diabetes Screening: age >45, overweight, family hx, PCOS, hx of gestational diabetes, at risk ethnicity Diabetes Screening screening: Yes  Lab Results  Component Value Date   HGBA1C 5.6 12/31/2018     Past Medical History:  Diagnosis Date  . Allergy   . CAD (coronary artery disease)    a. 03/2018 Cardiac CTA: Sev RCA dzs (FFR 0.74p/m, 0.66d); b. 04/2018 Cath/PCI: LM nl, LAD min irregs, D1/2 nl, LCX 20ost (initially 60%-->improved w/ IC ntg), OM1/2/3 nl, RCA 65ost (4.0x12 [place] DES), RPDA/RPAV nl, EF 55-65%.  . Colon polyps 2012  . Depression   . Genital warts   . Hyperlipidemia   . Hypertension   . Migraines   . Prediabetes     Past Surgical History:  Procedure Laterality Date  .  CHOLECYSTECTOMY    . COLONOSCOPY WITH PROPOFOL N/A 11/03/2017   Procedure: COLONOSCOPY WITH PROPOFOL;  Surgeon: Virgel Manifold, MD;  Location: [facility];  Service: Endoscopy;  Laterality: N/A;  . FOOT SURGERY     right-removal of some calcifications between the big toe and second toe  . HAMMER TOE SURGERY     right  . INTRAVASCULAR ULTRASOUND/IVUS N/A 04/26/2018   Procedure: Intravascular Ultrasound/IVUS;  Surgeon: Nelva Bush, MD;  Location: [facility];  Service: Cardiovascular;  Laterality: N/A;  . LEFT HEART CATH AND CORONARY ANGIOGRAPHY N/A 04/26/2018   Procedure: LEFT HEART CATH AND CORONARY ANGIOGRAPHY;  Surgeon: Nelva Bush, MD;  Location: [place] [facility];  Service: Cardiovascular;  Laterality: N/A;  . TONSILLECTOMY  1974    Prior to Admission medications   Medication Sig Start Date End Date Taking? Authorizing Provider  albuterol (PROVENTIL HFA;VENTOLIN HFA) 108 (90 Base) MCG/ACT inhaler Inhale 2 puffs into the lungs every 6 (six) hours as needed. 08/14/15  Yes Lucille Passy, MD  ALPRAZolam Duanne Moron) 0.25 MG tablet Take 1 tablet (0.25 mg total) by mouth 2 (two) times daily as needed for anxiety. 11/05/18  Yes Lucille Passy, MD  aspirin 81 MG chewable tablet Chew 1 tablet (81 mg total) by mouth daily. 04/27/18  Yes Kathyrn Drown D, NP  atenolol (TENORMIN) 50 MG tablet TAKE 1 TABLET DAILY (DISCONTINUE PRESCRIPTION FOR 25 MG, START 50 MG) 02/25/19  Yes Lucille Passy, MD  atorvastatin (LIPITOR) 40 MG tablet Take 1 tablet (40 mg total) by mouth daily. 01/06/20  Yes Lesleigh Noe, MD  cetirizine (ZYRTEC) 10 MG tablet Take 1 tablet (10 mg total) by mouth daily. 01/26/17  Yes Lucille Passy, MD  fish oil-omega-3 fatty acids 1000 MG capsule Take 1 g by mouth daily.     Yes [provider]  metFORMIN (GLUCOPHAGE) 500 MG tablet TAKE 1 TABLET TWICE A DAY WITH MEALS 02/25/19  Yes Lucille Passy, MD  nitroGLYCERIN (NITROSTAT) 0.4 MG SL tablet Place 1 tablet (0.4 mg  total) under the tongue every 5 (five) minutes as needed for chest pain. Maximum of 3 doses. 04/09/18 02/16/21 Yes End, Harrell Gave, MD  sertraline (ZOLOFT) 50 MG tablet Take 1 tablet (50 mg total) by mouth daily. 01/06/20  Yes Lesleigh Noe, MD    Allergies  Allergen Reactions  . Shellfish Allergy     Gynecologic History: Patient's last menstrual period was 12/24/2017 (exact date).  Obstetric History: No obstetric history on file.  Social History   Socioeconomic History  . Marital status: Married    Spouse name: Bari Edward  . Number of children: Not on file  . Years of education: associates degree  . Highest education level: Not on file  Occupational History  . Occupation: Engineer, maintenance:  [employer]    Comment: [employer]  Tobacco Use  . Smoking status: Former Smoker    Packs/day: 1.00    Years: 15.00    Pack years: 15.00    Types: Cigarettes    Quit date: 05/2007    Years since quitting: 12.7  . Smokeless tobacco: Never Used  Vaping Use  . Vaping Use: Never used  Substance and Sexual Activity  . Alcohol use: No    Alcohol/week: 0.0 standard drinks  . Drug use: No  . Sexual activity: Not Currently  Other Topics Concern  . Not on file  Social History Narrative   08/28/19   From: Born in [place], most recently from [place]: with husband, Quillian Quince since 2006   Work: for [employer], but now outsource with merger      Family: parents in [place], siblings all over the country       Enjoys: house projects, building out the tobacco barn, gardening      Exercise: just working in the yard, was walking   Diet: overeating with stress, does cook at home 95%      Safety   Seat belts: Yes    Guns: Yes  and secure   Safe in relationships: Yes    Social Determinants of Radio broadcast assistant Strain:   . Difficulty of Paying Living Expenses: Not on file  Food Insecurity:   . Worried About Charity fundraiser in the Last Year: Not  on file  . Ran Out of Food in the Last Year: Not on file  Transportation Needs:   . Lack of Transportation (Medical): Not on file  . Lack of Transportation (Non-Medical): Not on file  Physical Activity:   . Days of Exercise per Week: Not on file  . Minutes of Exercise per Session: Not on file  Stress:   . Feeling of Stress : Not on file  Social Connections:   . Frequency of Communication with Friends and Family: Not on file  . Frequency of Social Gatherings with Friends and Family: Not on file  . Attends Religious Services: Not on file  . Active Member of Clubs or Organizations: Not on file  . Attends Archivist Meetings: Not on file  . Marital Status: Not on file  Intimate Partner Violence:   . Fear of Current or Ex-Partner: Not on file  . Emotionally Abused: Not on file  . Physically Abused: Not on file  . Sexually Abused: Not on file    Family History  Adopted: Yes  Problem Relation Age of Onset  . Stroke Mother   . Coronary artery disease Sister 49       1/2 sister  . Breast cancer Neg Hx     Review of Systems  Constitutional: Negative for chills and fever.  HENT: Negative for congestion and sore throat.   Eyes: Negative for blurred vision and double vision.  Respiratory: Negative for shortness of breath.   Cardiovascular: Negative for chest pain.  Gastrointestinal: Negative for heartburn, nausea and vomiting.  Genitourinary: Negative.   Musculoskeletal: Negative.  Negative for myalgias.  Skin: Negative for rash.       Dry skin  Neurological: Negative for dizziness and headaches.  Endo/Heme/Allergies: Does not bruise/bleed easily.  Psychiatric/Behavioral: Negative for depression. The patient is not nervous/anxious.      Physical Exam BP 128/80   Pulse 67   Temp 97.6 F (36.4 C) (Temporal)   Ht  5' 3.75" (1.619 m)   Wt 260 lb 8 oz (118.2 kg)   LMP 12/24/2017 (Exact Date)   SpO2 98%   BMI 45.07 kg/m    BP Readings from Last 3 Encounters:   02/17/20 128/80  08/28/19 118/62  04/17/19 122/80      Physical Exam Constitutional:      General: She is not in acute distress.    Appearance: She is well-developed. She is obese. She is not diaphoretic.  HENT:     Head: Normocephalic and atraumatic.     Right Ear: External ear normal.     Left Ear: External ear normal.     Nose: Nose normal.  Eyes:     General: No scleral icterus.    Conjunctiva/sclera: Conjunctivae normal.  Cardiovascular:     Rate and Rhythm: Normal rate and regular rhythm.     Heart sounds: No murmur heard.   Pulmonary:     Effort: Pulmonary effort is normal. No respiratory distress.     Breath sounds: Normal breath sounds. No wheezing.  Abdominal:     General: Bowel sounds are normal. There is no distension.     Palpations: Abdomen is soft. There is no mass.     Tenderness: There is no abdominal tenderness. There is no guarding or rebound.  Musculoskeletal:        General: Normal range of motion.     Cervical back: Neck supple.  Lymphadenopathy:     Cervical: No cervical adenopathy.  Skin:    General: Skin is warm and dry.     Capillary Refill: Capillary refill takes less than 2 seconds.  Neurological:     Mental Status: She is alert and oriented to person, place, and time.     Deep Tendon Reflexes: Reflexes normal.  Psychiatric:        Mood and Affect: Mood normal.        Behavior: Behavior normal.     Results:  PHQ-9:    Office Visit from 08/28/2019 in [place] at [facility]  PHQ-9 Total Score 4        Assessment: 54 y.o. No obstetric history on file. female here for routine annual physical examination.  Plan: Problem List Items Addressed This Visit      Cardiovascular and Mediastinum   Essential hypertension   Relevant Orders   Comprehensive metabolic panel     Other   HLD (hyperlipidemia)   Relevant Orders   Lipid panel   Pre-diabetes   Relevant Orders   Hemoglobin A1c    Other Visit Diagnoses    Need  for influenza vaccination    -  Primary   Relevant Orders   Flu Vaccine QUAD 36+ mos IM (Completed)   Dry skin       Relevant Orders   TSH   Annual physical exam       Relevant Orders   CBC   Encounter for hepatitis C screening test for low risk patient       Relevant Orders   Hepatitis C antibody      Screening: -- Blood pressure screen normal -- cholesterol screening: will obtain -- Weight screening: obese: discussed management options, including lifestyle, dietary, and exercise. -- Diabetes Screening: will obtain -- Nutrition: Encouraged healthy diet  The 10-year ASCVD risk score Mikey Bussing DC Jr., et al., 2013) is: 1.8%   Values used to calculate the score:     Age: 89 years     Sex: Female  Is Non-Hispanic African American: No     Diabetic: No     Tobacco smoker: No     Systolic Blood Pressure: 009 mmHg     Is BP treated: Yes     HDL Cholesterol: 44 mg/dL     Total Cholesterol: 148 mg/dL  -- Statin therapy for Age 59-75 with CVD risk >7.5%  Psych -- Depression screening (PHQ-9):    Office Visit from 08/28/2019 in [place] at [facility]  PHQ-9 Total Score 4       Safety -- tobacco screening: not using -- alcohol screening:  low-risk usage. -- no evidence of domestic violence or intimate partner violence.   Cancer Screening -- pap smear not collected per ASCCP guidelines -- family history of breast cancer screening: done. not at high risk. -- Mammogram - up to date -- Colon cancer (age 2+)-- up to date  Immunizations Immunization History  Administered Date(s) Administered  . Influenza, Seasonal, Injecte, Preservative Fre 03/07/2019  . Influenza,inj,Quad PF,6+ Mos 02/17/2020  . Influenza-Unspecified 03/06/2014, 03/06/2016  . PFIZER SARS-COV-2 Vaccination 06/18/2019, 07/08/2019  . Td 05/11/2005  . Tdap 09/13/2017    -- flu vaccine up to date -- TDAP q10 years up to date -- Covid-19 Vaccine up to date   Encouraged healthy diet and  exercise. Encouraged regular vision and dental care.    Lesleigh Noe, MD

## 2020-02-18 LAB — HEPATITIS C ANTIBODY
Hepatitis C Ab: NONREACTIVE
SIGNAL TO CUT-OFF: 0.04 (ref <1.00)
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
## 2020-03-20 ENCOUNTER — Telehealth: Payer: Self-pay | Admitting: Internal Medicine

## 2020-03-20 NOTE — Telephone Encounter (Signed)
Received records request from [employer], forwarded to [facility] Records Department for processing.

## 2020-04-20 ENCOUNTER — Encounter: Payer: Self-pay | Admitting: Family Medicine

## 2020-05-02 ENCOUNTER — Other Ambulatory Visit: Payer: Self-pay | Admitting: Family Medicine

## 2020-05-06 ENCOUNTER — Other Ambulatory Visit: Payer: Self-pay | Admitting: *Deleted

## 2020-05-06 MED ORDER — ATORVASTATIN CALCIUM 40 MG PO TABS: 40.0000 mg | ORAL_TABLET | Freq: Every day | ORAL | 2 refills | Status: DC

## 2020-05-06 MED ORDER — ATENOLOL 50 MG PO TABS: ORAL_TABLET | ORAL | 2 refills | Status: DC

## 2020-05-11 ENCOUNTER — Other Ambulatory Visit: Payer: Self-pay

## 2020-05-11 MED ORDER — METFORMIN HCL 500 MG PO TABS: 500.0000 mg | ORAL_TABLET | Freq: Two times a day (BID) | ORAL | 3 refills | Status: DC

## 2020-06-17 DIAGNOSIS — Z20822 Contact with and (suspected) exposure to covid-19: Secondary | ICD-10-CM | POA: Diagnosis not present

## 2020-06-17 DIAGNOSIS — Z03818 Encounter for observation for suspected exposure to other biological agents ruled out: Secondary | ICD-10-CM | POA: Diagnosis not present

## 2020-08-19 ENCOUNTER — Other Ambulatory Visit: Payer: Self-pay

## 2020-08-19 ENCOUNTER — Ambulatory Visit (INDEPENDENT_AMBULATORY_CARE_PROVIDER_SITE_OTHER): Payer: BC Managed Care – PPO | Admitting: Family Medicine

## 2020-08-19 VITALS — BP 110/70 | HR 72 | Temp 97.3°F | Ht 64.0 in | Wt 265.5 lb

## 2020-08-19 DIAGNOSIS — R82998 Other abnormal findings in urine: Secondary | ICD-10-CM | POA: Diagnosis not present

## 2020-08-19 DIAGNOSIS — R7303 Prediabetes: Secondary | ICD-10-CM

## 2020-08-19 DIAGNOSIS — I10 Essential (primary) hypertension: Secondary | ICD-10-CM | POA: Diagnosis not present

## 2020-08-19 DIAGNOSIS — R438 Other disturbances of smell and taste: Secondary | ICD-10-CM

## 2020-08-19 LAB — POC URINALSYSI DIPSTICK (AUTOMATED)
Bilirubin, UA: NEGATIVE
Blood, UA: NEGATIVE
Glucose, UA: NEGATIVE
Ketones, UA: NEGATIVE
Leukocytes, UA: NEGATIVE
Nitrite, UA: NEGATIVE
Protein, UA: NEGATIVE
Spec Grav, UA: 1.015 (ref 1.010–1.025)
Urobilinogen, UA: 0.2 E.U./dL
pH, UA: 6 (ref 5.0–8.0)

## 2020-08-19 LAB — TSH: TSH: 3.45 u[IU]/mL (ref 0.35–4.50)

## 2020-08-19 LAB — COMPREHENSIVE METABOLIC PANEL
ALT: 35 U/L (ref 0–35)
AST: 26 U/L (ref 0–37)
Albumin: 4.2 g/dL (ref 3.5–5.2)
Alkaline Phosphatase: 124 U/L — ABNORMAL HIGH (ref 39–117)
BUN: 16 mg/dL (ref 6–23)
CO2: 31 mEq/L (ref 19–32)
Calcium: 9.4 mg/dL (ref 8.4–10.5)
Chloride: 101 mEq/L (ref 96–112)
Creatinine, Ser: 0.81 mg/dL (ref 0.40–1.20)
GFR: 83.07 mL/min (ref >60.00)
Glucose, Bld: 97 mg/dL (ref 70–99)
Potassium: 4.7 mEq/L (ref 3.5–5.1)
Sodium: 139 mEq/L (ref 135–145)
Total Bilirubin: 1 mg/dL (ref 0.2–1.2)
Total Protein: 7.1 g/dL (ref 6.0–8.3)

## 2020-08-19 LAB — MICROALBUMIN / CREATININE URINE RATIO
Creatinine,U: 63.4 mg/dL
Microalb Creat Ratio: 1.1 mg/g (ref 0.0–30.0)
Microalb, Ur: <0.7 mg/dL (ref 0.0–1.9)

## 2020-08-19 LAB — VITAMIN B12: Vitamin B-12: 644 pg/mL (ref 211–911)

## 2020-08-19 NOTE — Assessment & Plan Note (Signed)
Cyclical symptoms lasting 2 weeks including - metallic taste to water and decreased PO intakes a well as foamy urine, muscle cramps, diarrhea, and generalized fatigue. Discussed the metallic taste leading to decreased intake could be the cause of symptoms. She drinks bottled water but does have well water at home - discussed zinc but decided this is low likelihood. Will screen for liver, kidney, and b12 cause.

## 2020-08-19 NOTE — Assessment & Plan Note (Signed)
Lab Results  Component Value Date   HGBA1C 5.8 02/17/2020   Controlled. Will check urine protein today. Cont metformin 500 mg bid

## 2020-08-19 NOTE — Progress Notes (Signed)
Subjective:     Elizabeth Fleming is a 55 y.o. female presenting for Fatigue (Feels "full of poison, like my body is fighting me."), foamy urine, and Muscle Pain (Cramps on and off)     HPI   #Foamy urine - no dysuria, frequency, hesitancy - endorses urgency,  - bloating sensation - recurrent issue for her and this often ends with diarrhea - worried about kidney function with her medication  #Fatigue - feels like her body is fighting against her and "wears her out" - feels like she is on her period - tired and crampy, irritable - will have a cycle through these symptoms which last 2 weeks and happen 3-4 times a year - has been checking blood sugar 125 - high CBG associated with hot and sweaty sensation - did miss her atenolol a few days last week - sleep is good, wakes up rested - muscle cramps are also cyclical and happen at the end of this - with cramping  - water intake: depends 60 oz of water daily - during the episode water tastes metallic    Review of Systems  Gastrointestinal: Positive for nausea. Negative for abdominal pain, diarrhea and vomiting.       Bloating     Social History   Tobacco Use  Smoking Status Former Smoker  . Packs/day: 1.00  . Years: 15.00  . Pack years: 15.00  . Types: Cigarettes  . Quit date: 05/2007  . Years since quitting: 13.2  Smokeless Tobacco Never Used        Objective:    BP Readings from Last 3 Encounters:  08/19/20 110/70  02/17/20 128/80  08/28/19 118/62   Wt Readings from Last 3 Encounters:  08/19/20 265 lb 8 oz (120.4 kg)  02/17/20 260 lb 8 oz (118.2 kg)  08/28/19 244 lb 4 oz (110.8 kg)    BP 110/70   Pulse 72   Temp (!) 97.3 F (36.3 C) (Temporal)   Ht 5\' 4"  (1.626 m)   Wt 265 lb 8 oz (120.4 kg)   LMP 12/24/2017 (Exact Date)   SpO2 97%   BMI 45.57 kg/m    Physical Exam Constitutional:      General: She is not in acute distress.    Appearance: She is well-developed. She is not diaphoretic.   HENT:     Right Ear: External ear normal.     Left Ear: External ear normal.     Nose: Nose normal.     Mouth/Throat:     Mouth: Mucous membranes are moist.     Pharynx: No oropharyngeal exudate or posterior oropharyngeal erythema.  Eyes:     Conjunctiva/sclera: Conjunctivae normal.  Cardiovascular:     Rate and Rhythm: Normal rate and regular rhythm.     Heart sounds: No murmur heard.   Pulmonary:     Effort: Pulmonary effort is normal. No respiratory distress.     Breath sounds: Normal breath sounds. No wheezing.  Musculoskeletal:     Cervical back: Neck supple.  Skin:    General: Skin is warm and dry.     Capillary Refill: Capillary refill takes less than 2 seconds.  Neurological:     Mental Status: She is alert. Mental status is at baseline.  Psychiatric:        Mood and Affect: Mood normal.        Behavior: Behavior normal.           Assessment & Plan:   Problem List  Items Addressed This Visit      Cardiovascular and Mediastinum   Essential hypertension    BP at goal. Cont atenolol 50 mg      Relevant Orders   Comprehensive metabolic panel     Other   Morbid obesity ([place]) (Chronic)    Recent weight gain and atypical symptoms including fatigue and constipation. Will recheck thyroid      Relevant Orders   TSH   Pre-diabetes    Lab Results  Component Value Date   HGBA1C 5.8 02/17/2020   Controlled. Will check urine protein today. Cont metformin 500 mg bid      Relevant Orders   Microalbumin / creatinine urine ratio   Metallic taste    Cyclical symptoms lasting 2 weeks including - metallic taste to water and decreased PO intakes a well as foamy urine, muscle cramps, diarrhea, and generalized fatigue. Discussed the metallic taste leading to decreased intake could be the cause of symptoms. She drinks bottled water but does have well water at home - discussed zinc but decided this is low likelihood. Will screen for liver, kidney, and b12 cause.        Relevant Orders   Vitamin B12   Foamy urine - Primary    Pt with intermittent foamy urine and cyclical symptoms. Not present today and UA w/o infection. Will get urine microalbumin to look for protein. Discussed if normal, can call if symptoms return for protein check at that time. Encouraged hydration.       Relevant Orders   POCT Urinalysis Dipstick (Automated) (Completed)   Microalbumin / creatinine urine ratio       Return if symptoms worsen or fail to improve.  Lesleigh Noe, MD  This visit occurred during the SARS-CoV-2 public health emergency.  Safety protocols were in place, including screening questions prior to the visit, additional usage of staff PPE, and extensive cleaning of exam room while observing appropriate contact time as indicated for disinfecting solutions.

## 2020-08-19 NOTE — Patient Instructions (Signed)
Push water - use flavor if you need to  Labs today  Mychart or call if foamy in the future

## 2020-08-19 NOTE — Assessment & Plan Note (Signed)
Recent weight gain and atypical symptoms including fatigue and constipation. Will recheck thyroid

## 2020-08-19 NOTE — Assessment & Plan Note (Signed)
BP at goal. Cont atenolol 50 mg

## 2020-08-19 NOTE — Assessment & Plan Note (Signed)
Pt with intermittent foamy urine and cyclical symptoms. Not present today and UA w/o infection. Will get urine microalbumin to look for protein. Discussed if normal, can call if symptoms return for protein check at that time. Encouraged hydration.

## 2020-10-01 DIAGNOSIS — M65312 Trigger thumb, left thumb: Secondary | ICD-10-CM | POA: Diagnosis not present

## 2020-11-10 ENCOUNTER — Other Ambulatory Visit: Payer: Self-pay | Admitting: Family Medicine

## 2020-12-21 ENCOUNTER — Encounter: Payer: Self-pay | Admitting: Family Medicine

## 2021-01-17 ENCOUNTER — Other Ambulatory Visit: Payer: Self-pay | Admitting: Family Medicine

## 2021-01-18 NOTE — Telephone Encounter (Signed)
Pt will call back to schedule

## 2021-02-26 ENCOUNTER — Other Ambulatory Visit: Payer: Self-pay

## 2021-02-26 MED ORDER — ACETAMINOPHEN-CODEINE #3 300-30 MG PO TABS
ORAL_TABLET | ORAL | 0 refills | Status: DC
  Filled 2021-02-26: qty 16, 2d supply, fill #0

## 2021-02-26 MED ORDER — AMOXICILLIN 875 MG PO TABS
ORAL_TABLET | ORAL | 0 refills | Status: DC
Start: 2021-02-26 — End: 2021-04-06
  Filled 2021-02-26: qty 11, 5d supply, fill #0

## 2021-02-26 MED ORDER — DEXAMETHASONE 4 MG PO TABS
ORAL_TABLET | ORAL | 0 refills | Status: DC
Start: 2021-02-26 — End: 2021-04-06
  Filled 2021-02-26: qty 2, 2d supply, fill #0

## 2021-03-05 ENCOUNTER — Other Ambulatory Visit: Payer: Self-pay

## 2021-03-05 MED ORDER — AZITHROMYCIN 250 MG PO TABS
ORAL_TABLET | ORAL | 0 refills | Status: DC
Start: 2021-03-05 — End: 2021-04-26
  Filled 2021-03-05: qty 6, 5d supply, fill #0

## 2021-03-12 ENCOUNTER — Encounter: Payer: Self-pay | Admitting: Family Medicine

## 2021-03-12 DIAGNOSIS — R7303 Prediabetes: Secondary | ICD-10-CM

## 2021-03-12 DIAGNOSIS — Z780 Asymptomatic menopausal state: Secondary | ICD-10-CM

## 2021-03-18 NOTE — Addendum Note (Signed)
Addended by: Lesleigh Noe on: 03/18/2021 01:38 PM   Modules accepted: Orders

## 2021-04-06 ENCOUNTER — Ambulatory Visit (INDEPENDENT_AMBULATORY_CARE_PROVIDER_SITE_OTHER): Payer: BC Managed Care – PPO | Admitting: Family Medicine

## 2021-04-06 ENCOUNTER — Other Ambulatory Visit: Payer: Self-pay

## 2021-04-06 ENCOUNTER — Encounter: Payer: Self-pay | Admitting: Family Medicine

## 2021-04-06 VITALS — BP 118/80 | HR 69 | Temp 95.8°F | Ht 64.02 in | Wt 278.8 lb

## 2021-04-06 DIAGNOSIS — Z0000 Encounter for general adult medical examination without abnormal findings: Secondary | ICD-10-CM

## 2021-04-06 DIAGNOSIS — R7303 Prediabetes: Secondary | ICD-10-CM | POA: Diagnosis not present

## 2021-04-06 DIAGNOSIS — I10 Essential (primary) hypertension: Secondary | ICD-10-CM

## 2021-04-06 DIAGNOSIS — E782 Mixed hyperlipidemia: Secondary | ICD-10-CM | POA: Diagnosis not present

## 2021-04-06 DIAGNOSIS — Z91018 Allergy to other foods: Secondary | ICD-10-CM | POA: Diagnosis not present

## 2021-04-06 LAB — COMPREHENSIVE METABOLIC PANEL
ALT: 42 U/L — ABNORMAL HIGH (ref 0–35)
AST: 32 U/L (ref 0–37)
Albumin: 4.3 g/dL (ref 3.5–5.2)
Alkaline Phosphatase: 121 U/L — ABNORMAL HIGH (ref 39–117)
BUN: 14 mg/dL (ref 6–23)
CO2: 28 mEq/L (ref 19–32)
Calcium: 9 mg/dL (ref 8.4–10.5)
Chloride: 102 mEq/L (ref 96–112)
Creatinine, Ser: 0.74 mg/dL (ref 0.40–1.20)
GFR: 92.18 mL/min (ref >60.00)
Glucose, Bld: 102 mg/dL — ABNORMAL HIGH (ref 70–99)
Potassium: 4.1 mEq/L (ref 3.5–5.1)
Sodium: 138 mEq/L (ref 135–145)
Total Bilirubin: 1.1 mg/dL (ref 0.2–1.2)
Total Protein: 6.9 g/dL (ref 6.0–8.3)

## 2021-04-06 LAB — LIPID PANEL
Cholesterol: 157 mg/dL (ref 0–200)
HDL: 40.1 mg/dL (ref >39.00)
NonHDL: 117.22
Total CHOL/HDL Ratio: 4
Triglycerides: 263 mg/dL — ABNORMAL HIGH (ref 0.0–149.0)
VLDL: 52.6 mg/dL — ABNORMAL HIGH (ref 0.0–40.0)

## 2021-04-06 LAB — LDL CHOLESTEROL, DIRECT: Direct LDL: 95 mg/dL

## 2021-04-06 LAB — HEMOGLOBIN A1C: Hgb A1c MFr Bld: 6 % (ref 4.6–6.5)

## 2021-04-06 NOTE — Assessment & Plan Note (Signed)
Advised considering mounjaro or ozempic. Will f/u labs discuss further

## 2021-04-06 NOTE — Assessment & Plan Note (Signed)
Notices nausea with red meat. Improved with avoiding. Discussed consider alpha-gal testing which was ordered.

## 2021-04-06 NOTE — Patient Instructions (Addendum)
Mounjaro -- check out website has coupon  Ozempic - one for diabetes that helps with weight loss (check insurance)   Menopause Menopause may increase your risk for: Loss of bone (osteoporosis), which causes bone breaks (fractures). Depression. Hardening and narrowing of the arteries (atherosclerosis), which can cause heart attacks and strokes. What are the causes? This condition is usually caused by a natural change in hormone levels that happens as you get older. The condition may also be caused by surgery to remove both ovaries (bilateral oophorectomy). Follow these instructions at home: Lifestyle Do not use any products that contain nicotine or tobacco, such as cigarettes and e-cigarettes. If you need help quitting, ask your health care provider. Get at least 30 minutes of physical activity on 5 or more days each week. Avoid alcoholic and caffeinated beverages, as well as spicy foods. This may help prevent hot flashes. Get 7-8 hours of sleep each night. If you have hot flashes, try: Dressing in layers. Avoiding things that may trigger hot flashes, such as spicy food, hot drinks, alcohol, caffeine, warm places, or stress. Taking slow, deep breaths when a hot flash starts. Keeping a fan in your home and office. Find ways to manage stress: regular exercise, meditation, yoga, qigong, Tai Chi, biofeedback, acupuncture or massage Consider going to group therapy with other women who are having menopause symptoms. Ask your health care provider about recommended group therapy meetings. Staying cool while sleeping: dress in light clothing, use layed bedding that can be easily removed, Sleep with a fan nearby, put an ice pack under your pillow and flip your pillow regularly  Eating and drinking Eat a healthy, balanced diet that contains whole grains, lean protein, low-fat dairy, and plenty of fruits and vegetables. Your health care provider may recommend adding more soy to your diet. Foods that  contain soy include tofu, tempeh, and soy milk. Eat plenty of foods that contain calcium and vitamin D for bone health. Items that are rich in calcium include low-fat milk, yogurt, beans, almonds, sardines, broccoli, and kale. Medicines Non-prescription medications for Hot Flashes Soy - eat 1-2 servings of soy foods daily Herbs: like black cohosh have shown some improvement with hot flashes Talk with your health care provider before starting any herbal supplements. If prescribed, take vitamins and supplements as told by your health care provider. These may include: Calcium. Women age 11 and older should get 1,200 mg (milligrams) of calcium every day. Vitamin D. Women need 600-800 International Units of vitamin D each day. Prescription Medications Hormone Treatment: increased risk of breast cancer and cardiovascular disease. Should be used for a short time and in women under 60 have a lower risk overall if they take it Non-Hormone Treatment: Paroxetine which is an antidepressant, has been shown to reduce Hot Flashes

## 2021-04-06 NOTE — Progress Notes (Signed)
Annual Exam   Chief Complaint:  Chief Complaint  Patient presents with   Annual Exam    History of Present Illness:  [name] is a 55 y.o. No obstetric history on file. who LMP was Patient's last menstrual period was 12/24/2017 (exact date)., presents today for her annual examination.     Nutrition She does get adequate calcium and Vitamin D in her diet. Diet: generally healthy Exercise: not currently    Safety The patient wears seatbelts: yes.     The patient feels safe at home and in their relationships: yes.   Menstrual:  Symptoms of menopause: hot flashes  GYN She is not currently sexually active   Cervical Cancer Screening (21-65):   Last Pap:   April 2019 Results were: no abnormalities /neg HPV DNA    Breast Cancer-relatedfamily history is negative for Breast cancer. She was adopted.   Breast Cancer Screening (Age 31-74):  There is no FH of breast cancer. There is no FH of ovarian cancer. BRCA screening Not Indicated.  Last Mammogram: 12/2019 The patient does want a mammogram this year.    Colon Cancer Screening:  Age 47-75 yo - benefits outweigh the risk. Adults 73-85 yo who have never been screened benefit.  Benefits: 134000 people in 2016 will be diagnosed and 49,000 will die - early detection helps [place]ms: Complications 2/2 to colonoscopy High Risk (Colonoscopy): genetic disorder (Lynch syndrome or familial adenomatous polyposis), personal hx of IBD, previous adenomatous polyp, or previous colorectal cancer, FamHx start 10 years before the age at diagnosis, increased in males and black race  Options:  FIT - looks for hemoglobin (blood in the stool) - specific and fairly sensitive - must be done annually Cologuard - looks for DNA and blood - more sensitive - therefore can have more false positives, every 3 years Colonoscopy - every 10 years if normal - sedation, bowl prep, must have someone drive you  Shared decision making and the patient  had decided to do colonoscopy - due 2024.   Social History   Tobacco Use  Smoking Status Former   Packs/day: 1.00   Years: 15.00   Pack years: 15.00   Types: Cigarettes   Quit date: 05/2007   Years since quitting: 13.9  Smokeless Tobacco Never    Lung Cancer Screening (Ages 23-80): yes 20 year pack history? No   Weight Wt Readings from Last 3 Encounters:  04/06/21 278 lb 12.8 oz (126.5 kg)  08/19/20 265 lb 8 oz (120.4 kg)  02/17/20 260 lb 8 oz (118.2 kg)   Patient has very high BMI  BMI Readings from Last 1 Encounters:  04/06/21 47.83 kg/m     Chronic disease screening Blood pressure monitoring:  BP Readings from Last 3 Encounters:  04/06/21 118/80  08/19/20 110/70  02/17/20 128/80    Lipid Monitoring: Indication for screening: age >87, obesity, diabetes, family hx, CV risk factors.  Lipid screening: Yes  Lab Results  Component Value Date   CHOL 149 02/17/2020   HDL 42.90 02/17/2020   LDLCALC 71 02/17/2020   LDLDIRECT 72.0 12/31/2018   TRIG 174.0 (H) 02/17/2020   CHOLHDL 3 02/17/2020     Diabetes Screening: age >72, overweight, family hx, PCOS, hx of gestational diabetes, at risk ethnicity Diabetes Screening screening: Yes  Lab Results  Component Value Date   HGBA1C 5.8 02/17/2020     Past Medical History:  Diagnosis Date   Allergy    CAD (coronary artery disease)  a. 03/2018 Cardiac CTA: Sev RCA dzs (FFR 0.74p/m, 0.66d); b. 04/2018 Cath/PCI: LM nl, LAD min irregs, D1/2 nl, LCX 20ost (initially 60%-->improved w/ IC ntg), OM1/2/3 nl, RCA 65ost (4.0x12 [place] DES), RPDA/RPAV nl, EF 55-65%.   Colon polyps 2012   Depression    Genital warts    Hyperlipidemia    Hypertension    Migraines    Prediabetes     Past Surgical History:  Procedure Laterality Date   CHOLECYSTECTOMY     COLONOSCOPY WITH PROPOFOL N/A 11/03/2017   Procedure: COLONOSCOPY WITH PROPOFOL;  Surgeon: Virgel Manifold, MD;  Location: [facility];  Service: Endoscopy;   Laterality: N/A;   FOOT SURGERY     right-removal of some calcifications between the big toe and second toe   HAMMER TOE SURGERY     right   INTRAVASCULAR ULTRASOUND/IVUS N/A 04/26/2018   Procedure: Intravascular Ultrasound/IVUS;  Surgeon: Nelva Bush, MD;  Location: [facility];  Service: Cardiovascular;  Laterality: N/A;   LEFT HEART CATH AND CORONARY ANGIOGRAPHY N/A 04/26/2018   Procedure: LEFT HEART CATH AND CORONARY ANGIOGRAPHY;  Surgeon: Nelva Bush, MD;  Location: [facility];  Service: Cardiovascular;  Laterality: N/A;   TONSILLECTOMY  1974    Prior to Admission medications   Medication Sig Start Date End Date Taking? Authorizing Provider  acetaminophen-codeine (TYLENOL #3) 300-30 MG tablet Take 1 to 2 tablets by mouth every 4 hours as needed for dental pain 02/26/21     albuterol (PROVENTIL HFA;VENTOLIN HFA) 108 (90 Base) MCG/ACT inhaler Inhale 2 puffs into the lungs every 6 (six) hours as needed. 08/14/15   Lucille Passy, MD  ALPRAZolam Duanne Moron) 0.25 MG tablet Take 1 tablet (0.25 mg total) by mouth 2 (two) times daily as needed for anxiety. 11/05/18   Lucille Passy, MD  amoxicillin (AMOXIL) 875 MG tablet Take 1 tablet by mouth three times a day today, the 1 tablet twice a day until gone 02/26/21     aspirin 81 MG chewable tablet Chew 1 tablet (81 mg total) by mouth daily. 04/27/18   Kathyrn Drown D, NP  atenolol (TENORMIN) 50 MG tablet TAKE 1 TABLET DAILY 01/18/21   Lesleigh Noe, MD  atorvastatin (LIPITOR) 40 MG tablet TAKE 1 TABLET DAILY 01/18/21   Lesleigh Noe, MD  azithromycin (ZITHROMAX) 250 MG tablet Take 2 tablets by mouth daily on day 1 then 1 tablet daily on days 2-5. 03/05/21     cetirizine (ZYRTEC) 10 MG tablet Take 1 tablet (10 mg total) by mouth daily. 01/26/17   Lucille Passy, MD  dexamethasone (DECADRON) 4 MG tablet Take 1 tablet immediately after dental visit and 1 tablet 24 hours later 02/26/21     fish oil-omega-3 fatty acids 1000 MG capsule Take  1 g by mouth daily.    [provider]  metFORMIN (GLUCOPHAGE) 500 MG tablet Take 1 tablet (500 mg total) by mouth 2 (two) times daily with a meal. 05/11/20   Lesleigh Noe, MD  nitroGLYCERIN (NITROSTAT) 0.4 MG SL tablet Place 1 tablet (0.4 mg total) under the tongue every 5 (five) minutes as needed for chest pain. Maximum of 3 doses. 04/09/18 02/16/21  End, Harrell Gave, MD  sertraline (ZOLOFT) 50 MG tablet TAKE 1 TABLET DAILY 05/04/20   Lesleigh Noe, MD    Allergies  Allergen Reactions   Shellfish Allergy     Gynecologic History: Patient's last menstrual period was 12/24/2017 (exact date).  Obstetric History: No obstetric history  on file.  Social History   Socioeconomic History   Marital status: Married    Spouse name: Bari Edward   Number of children: Not on file   Years of education: associates degree   Highest education level: Not on file  Occupational History   Occupation: Engineer, maintenance: [employer]  Tobacco Use   Smoking status: Former    Packs/day: 1.00    Years: 15.00    Pack years: 15.00    Types: Cigarettes    Quit date: 05/2007    Years since quitting: 13.9   Smokeless tobacco: Never  Vaping Use   Vaping Use: Never used  Substance and Sexual Activity   Alcohol use: No    Alcohol/week: 0.0 standard drinks   Drug use: No   Sexual activity: Not Currently  Other Topics Concern   Not on file  Social History Narrative   08/28/19   From: Born in [place], most recently from [place]: with husband, Quillian Quince since 2006   Work: for [employer], but now outsource with merger      Family: parents in [place], siblings all over the country       Enjoys: house projects, building out the tobacco barn, gardening      Exercise: just working in the yard, was walking   Diet: overeating with stress, does cook at home 95%      Safety   Seat belts: Yes    Guns: Yes  and secure   Safe in relationships: Yes     Social Determinants of Radio broadcast assistant Strain: Not on file  Food Insecurity: Not on file  Transportation Needs: Not on file  Physical Activity: Not on file  Stress: Not on file  Social Connections: Not on file  Intimate Partner Violence: Not on file    Family History  Adopted: Yes  Problem Relation Age of Onset   Stroke Mother    Coronary artery disease Sister 51       1/2 sister   Breast cancer Neg Hx     Review of Systems  Constitutional:  Negative for chills and fever.  HENT:  Negative for congestion and sore throat.   Eyes:  Negative for blurred vision and double vision.  Respiratory:  Negative for shortness of breath.   Cardiovascular:  Negative for chest pain.  Gastrointestinal:  Negative for heartburn, nausea and vomiting.  Genitourinary: Negative.   Musculoskeletal: Negative.  Negative for myalgias.  Skin:  Negative for rash.  Neurological:  Negative for dizziness and headaches.  Endo/Heme/Allergies:  Does not bruise/bleed easily.  Psychiatric/Behavioral:  Negative for depression. The patient is not nervous/anxious.     Physical Exam BP 118/80   Pulse 69   Temp (!) 95.8 F (35.4 C)   Ht 5' 4.02" (1.626 m)   Wt 278 lb 12.8 oz (126.5 kg)   LMP 12/24/2017 (Exact Date)   SpO2 95%   BMI 47.83 kg/m    BP Readings from Last 3 Encounters:  04/06/21 118/80  08/19/20 110/70  02/17/20 128/80      Physical Exam Constitutional:      General: She is not in acute distress.    Appearance: She is well-developed. She is obese. She is not diaphoretic.  HENT:     Head: Normocephalic and atraumatic.     Right Ear: External ear normal.     Left Ear: External ear normal.  Nose: Nose normal.  Eyes:     General: No scleral icterus.    Extraocular Movements: Extraocular movements intact.     Conjunctiva/sclera: Conjunctivae normal.  Cardiovascular:     Rate and Rhythm: Normal rate and regular rhythm.     Heart sounds: No murmur heard. Pulmonary:      Effort: Pulmonary effort is normal. No respiratory distress.     Breath sounds: Normal breath sounds. No wheezing.  Abdominal:     General: Bowel sounds are normal. There is no distension.     Palpations: Abdomen is soft. There is no mass.     Tenderness: There is no abdominal tenderness. There is no guarding or rebound.  Musculoskeletal:        General: Normal range of motion.     Cervical back: Neck supple.  Lymphadenopathy:     Cervical: No cervical adenopathy.  Skin:    General: Skin is warm and dry.     Capillary Refill: Capillary refill takes less than 2 seconds.  Neurological:     Mental Status: She is alert and oriented to person, place, and time.     Deep Tendon Reflexes: Reflexes normal.  Psychiatric:        Mood and Affect: Mood normal.        Behavior: Behavior normal.    Results:  PHQ-9:  [facility] Visit from 08/28/2019 in [facility] [place]  PHQ-9 Total Score 4         Assessment: 55 y.o. No obstetric history on file. female here for routine annual physical examination.  Plan: Problem List Items Addressed This Visit       Cardiovascular and Mediastinum   Essential hypertension   Relevant Orders   Comprehensive metabolic panel     Other   Morbid obesity ([place]) (Chronic)    Advised considering mounjaro or ozempic. Will f/u labs discuss further      Relevant Orders   Lipid panel   HLD (hyperlipidemia)   Relevant Orders   Lipid panel   Pre-diabetes   Relevant Orders   Hemoglobin A1c   Allergy to meat    Notices nausea with red meat. Improved with avoiding. Discussed consider alpha-gal testing which was ordered.       Relevant Orders   Alpha-Gal Panel   Other Visit Diagnoses     Annual physical exam    -  Primary       Screening: -- Blood pressure screen normal -- cholesterol screening: will obtain -- Weight screening: obese: discussed management options, including lifestyle, dietary, and exercise. --  Diabetes Screening: will obtain -- Nutrition: Encouraged healthy diet  The 10-year ASCVD risk score (Arnett DK, et al., 2019) is: 1.7%   Values used to calculate the score:     Age: 13 years     Sex: Female     Is Non-Hispanic African American: No     Diabetic: No     Tobacco smoker: No     Systolic Blood Pressure: 662 mmHg     Is BP treated: Yes     HDL Cholesterol: 42.9 mg/dL     Total Cholesterol: 149 mg/dL  -- Statin therapy for Age 68-75 with CVD risk >7.5%  Psych -- Depression screening (PHQ-9):  [facility] Visit from 08/28/2019 in [facility] at [facility]  PHQ-9 Total Score 4        Safety -- tobacco screening: not using -- alcohol screening:  low-risk usage. -- no evidence  of domestic violence or intimate partner violence.   Cancer Screening -- pap smear not collected per ASCCP guidelines -- family history of breast cancer screening: done. not at high risk. -- Mammogram -  getting next year -- Colon cancer (age 52+)--  up to date  Immunizations Immunization History  Administered Date(s) Administered   Influenza, Seasonal, Injecte, Preservative Fre 03/07/2019   Influenza,inj,Quad PF,6+ Mos 02/17/2020   Influenza-Unspecified 03/06/2014, 03/06/2016   PFIZER(Purple Top)SARS-COV-2 Vaccination 06/18/2019, 07/08/2019, 04/02/2020   Td 05/11/2005   Tdap 09/13/2017    -- flu vaccine up to date -- TDAP q10 years up to date -- Shingles (age >50) not up to date - she will consider -- Covid-19 Vaccine up to date   Encouraged healthy diet and exercise. Encouraged regular vision and dental care.    Lesleigh Noe, MD

## 2021-04-07 ENCOUNTER — Other Ambulatory Visit: Payer: Self-pay

## 2021-04-07 ENCOUNTER — Encounter: Payer: Self-pay | Admitting: Family Medicine

## 2021-04-07 DIAGNOSIS — I10 Essential (primary) hypertension: Secondary | ICD-10-CM

## 2021-04-07 DIAGNOSIS — R7303 Prediabetes: Secondary | ICD-10-CM

## 2021-04-07 DIAGNOSIS — E782 Mixed hyperlipidemia: Secondary | ICD-10-CM

## 2021-04-07 DIAGNOSIS — F418 Other specified anxiety disorders: Secondary | ICD-10-CM

## 2021-04-07 MED ORDER — ATENOLOL 50 MG PO TABS
ORAL_TABLET | ORAL | 3 refills | Status: DC
  Filled 2021-04-07: qty 30, 30d supply, fill #0

## 2021-04-07 MED ORDER — ATORVASTATIN CALCIUM 40 MG PO TABS
40.0000 mg | ORAL_TABLET | Freq: Every day | ORAL | 3 refills | Status: DC
  Filled 2021-04-07: qty 30, 30d supply, fill #0

## 2021-04-07 MED ORDER — METFORMIN HCL 500 MG PO TABS
500.0000 mg | ORAL_TABLET | Freq: Two times a day (BID) | ORAL | 3 refills | Status: DC
  Filled 2021-04-07: qty 60, 30d supply, fill #0

## 2021-04-07 MED ORDER — TIRZEPATIDE 2.5 MG/0.5ML ~~LOC~~ SOAJ
2.5000 mg | SUBCUTANEOUS | 0 refills | Status: DC
  Filled 2021-04-07: qty 2, 28d supply, fill #0

## 2021-04-07 MED ORDER — SERTRALINE HCL 50 MG PO TABS
50.0000 mg | ORAL_TABLET | Freq: Every day | ORAL | 3 refills | Status: DC
  Filled 2021-04-07: qty 30, 30d supply, fill #0

## 2021-04-07 NOTE — Addendum Note (Signed)
Addended by: Waunita Schooner R on: 04/07/2021 01:03 PM   Modules accepted: Orders

## 2021-04-09 LAB — ALPHA-GAL PANEL
Beef IgE: <0.1 kU/L (ref <0.35)
Class: 0
Class: 0
Class: 0
Galactose-alpha-1,3-galactose IgE: <0.1 kU/L (ref <0.10)
LAMB/MUTTON IGE: <0.1 kU/L (ref <0.35)
Pork IgE: <0.1 kU/L (ref <0.35)

## 2021-04-12 ENCOUNTER — Encounter: Payer: Self-pay | Admitting: Family Medicine

## 2021-04-26 ENCOUNTER — Other Ambulatory Visit: Payer: Self-pay

## 2021-04-26 ENCOUNTER — Encounter: Payer: Self-pay | Admitting: Internal Medicine

## 2021-04-26 ENCOUNTER — Ambulatory Visit (INDEPENDENT_AMBULATORY_CARE_PROVIDER_SITE_OTHER): Payer: BC Managed Care – PPO | Admitting: Internal Medicine

## 2021-04-26 MED ORDER — TIRZEPATIDE 5 MG/0.5ML ~~LOC~~ SOAJ
5.0000 mg | SUBCUTANEOUS | 0 refills | Status: DC
Start: 2021-04-26 — End: 2021-05-20
  Filled 2021-04-26: qty 2, 28d supply, fill #0

## 2021-04-26 NOTE — Progress Notes (Signed)
Subjective:    Patient ID: Elizabeth Fleming, female    DOB: [birth year], 55 y.o.   MRN: [id number]  HPI Here for follow up about obesity treatment This visit occurred during the SARS-CoV-2 public health emergency.  Safety protocols were in place, including screening questions prior to the visit, additional usage of staff PPE, and extensive cleaning of exam room while observing appropriate contact time as indicated for disinfecting solutions.   Has done 3 injections of mounjaro Feels "much calmer and more in control" at first---not thinking about or craving Now starting to get an "anxious" feeling around food No nausea, bowel problems  Current Outpatient Medications on File Prior to Visit  Medication Sig Dispense Refill   ALPRAZolam (XANAX) 0.25 MG tablet Take 1 tablet (0.25 mg total) by mouth 2 (two) times daily as needed for anxiety. 30 tablet 0   aspirin 81 MG chewable tablet Chew 1 tablet (81 mg total) by mouth daily.     atenolol (TENORMIN) 50 MG tablet TAKE 1 TABLET DAILY 90 tablet 3   atorvastatin (LIPITOR) 40 MG tablet Take 1 tablet (40 mg total) by mouth daily. 90 tablet 3   cetirizine (ZYRTEC) 10 MG tablet Take 1 tablet (10 mg total) by mouth daily. 90 tablet 3   fish oil-omega-3 fatty acids 1000 MG capsule Take 1 g by mouth daily.     sertraline (ZOLOFT) 50 MG tablet Take 1 tablet (50 mg total) by mouth daily. 90 tablet 3   tirzepatide (MOUNJARO) 2.5 MG/0.5ML Pen Inject 2.5 mg into the skin once a week. 2 mL 0   No current facility-administered medications on file prior to visit.    Allergies  Allergen Reactions   Shellfish Allergy     Past Medical History:  Diagnosis Date   Allergy    CAD (coronary artery disease)    a. 03/2018 Cardiac CTA: Sev RCA dzs (FFR 0.74p/m, 0.66d); b. 04/2018 Cath/PCI: LM nl, LAD min irregs, D1/2 nl, LCX 20ost (initially 60%-->improved w/ IC ntg), OM1/2/3 nl, RCA 65ost (4.0x12 Sierra DES), RPDA/RPAV nl, EF 55-65%.   Colon polyps 2012    Depression    Genital warts    Hyperlipidemia    Hypertension    Migraines    Prediabetes     Past Surgical History:  Procedure Laterality Date   CHOLECYSTECTOMY     COLONOSCOPY WITH PROPOFOL N/A 11/03/2017   Procedure: COLONOSCOPY WITH PROPOFOL;  Surgeon: Virgel Manifold, MD;  Location: [facility];  Service: Endoscopy;  Laterality: N/A;   FOOT SURGERY     right-removal of some calcifications between the big toe and second toe   HAMMER TOE SURGERY     right   INTRAVASCULAR ULTRASOUND/IVUS N/A 04/26/2018   Procedure: Intravascular Ultrasound/IVUS;  Surgeon: Nelva Bush, MD;  Location: [facility];  Service: Cardiovascular;  Laterality: N/A;   LEFT HEART CATH AND CORONARY ANGIOGRAPHY N/A 04/26/2018   Procedure: LEFT HEART CATH AND CORONARY ANGIOGRAPHY;  Surgeon: Nelva Bush, MD;  Location: [place] [facility];  Service: Cardiovascular;  Laterality: N/A;   TONSILLECTOMY  1974    Family History  Adopted: Yes  Problem Relation Age of Onset   Stroke Mother    Coronary artery disease Sister 47       1/2 sister   Breast cancer Neg Hx     Social History   Socioeconomic History   Marital status: Married    Spouse name: Bari Edward   Number of children: Not on file  Years of education: associates degree   Highest education level: Not on file  Occupational History   Occupation: Engineer, maintenance: [employer]  Tobacco Use   Smoking status: Former    Packs/day: 1.00    Years: 15.00    Pack years: 15.00    Types: Cigarettes    Quit date: 05/2007    Years since quitting: 13.9   Smokeless tobacco: Never  Vaping Use   Vaping Use: Never used  Substance and Sexual Activity   Alcohol use: No    Alcohol/week: 0.0 standard drinks   Drug use: No   Sexual activity: Not Currently  Other Topics Concern   Not on file  Social History Narrative   08/28/19   From: Born in [place], most recently from [place]: with husband, Quillian Quince since 2006   Work: for [employer], but now outsource with merger      Family: parents in [place], siblings all over the country       Enjoys: house projects, building out the tobacco barn, gardening      Exercise: just working in the yard, was walking   Diet: overeating with stress, does cook at home 95%      Safety   Seat belts: Yes    Guns: Yes  and secure   Safe in relationships: Yes    Social Determinants of Radio broadcast assistant Strain: Not on file  Food Insecurity: Not on file  Transportation Needs: Not on file  Physical Activity: Not on file  Stress: Not on file  Social Connections: Not on file  Intimate Partner Violence: Not on file   Current Outpatient Medications on File Prior to Visit  Medication Sig Dispense Refill   ALPRAZolam (XANAX) 0.25 MG tablet Take 1 tablet (0.25 mg total) by mouth 2 (two) times daily as needed for anxiety. 30 tablet 0   aspirin 81 MG chewable tablet Chew 1 tablet (81 mg total) by mouth daily.     atenolol (TENORMIN) 50 MG tablet TAKE 1 TABLET DAILY 90 tablet 3   atorvastatin (LIPITOR) 40 MG tablet Take 1 tablet (40 mg total) by mouth daily. 90 tablet 3   cetirizine (ZYRTEC) 10 MG tablet Take 1 tablet (10 mg total) by mouth daily. 90 tablet 3   fish oil-omega-3 fatty acids 1000 MG capsule Take 1 g by mouth daily.     sertraline (ZOLOFT) 50 MG tablet Take 1 tablet (50 mg total) by mouth daily. 90 tablet 3   tirzepatide (MOUNJARO) 2.5 MG/0.5ML Pen Inject 2.5 mg into the skin once a week. 2 mL 0   No current facility-administered medications on file prior to visit.    Allergies  Allergen Reactions   Shellfish Allergy     Past Medical History:  Diagnosis Date   Allergy    CAD (coronary artery disease)    a. 03/2018 Cardiac CTA: Sev RCA dzs (FFR 0.74p/m, 0.66d); b. 04/2018 Cath/PCI: LM nl, LAD min irregs, D1/2 nl, LCX 20ost (initially 60%-->improved w/ IC ntg), OM1/2/3 nl, RCA 65ost (4.0x12 [place] DES),  RPDA/RPAV nl, EF 55-65%.   Colon polyps 2012   Depression    Genital warts    Hyperlipidemia    Hypertension    Migraines    Prediabetes     Past Surgical History:  Procedure Laterality Date   CHOLECYSTECTOMY     COLONOSCOPY WITH PROPOFOL N/A 11/03/2017   Procedure:  COLONOSCOPY WITH PROPOFOL;  Surgeon: Virgel Manifold, MD;  Location: [facility];  Service: Endoscopy;  Laterality: N/A;   FOOT SURGERY     right-removal of some calcifications between the big toe and second toe   HAMMER TOE SURGERY     right   INTRAVASCULAR ULTRASOUND/IVUS N/A 04/26/2018   Procedure: Intravascular Ultrasound/IVUS;  Surgeon: Nelva Bush, MD;  Location: [facility];  Service: Cardiovascular;  Laterality: N/A;   LEFT HEART CATH AND CORONARY ANGIOGRAPHY N/A 04/26/2018   Procedure: LEFT HEART CATH AND CORONARY ANGIOGRAPHY;  Surgeon: Nelva Bush, MD;  Location: [facility];  Service: Cardiovascular;  Laterality: N/A;   TONSILLECTOMY  1974    Family History  Adopted: Yes  Problem Relation Age of Onset   Stroke Mother    Coronary artery disease Sister 56       1/2 sister   Breast cancer Neg Hx     Social History   Socioeconomic History   Marital status: Married    Spouse name: Bari Edward   Number of children: Not on file   Years of education: associates degree   Highest education level: Not on file  Occupational History   Occupation: Engineer, maintenance: [employer]  Tobacco Use   Smoking status: Former    Packs/day: 1.00    Years: 15.00    Pack years: 15.00    Types: Cigarettes    Quit date: 05/2007    Years since quitting: 13.9   Smokeless tobacco: Never  Vaping Use   Vaping Use: Never used  Substance and Sexual Activity   Alcohol use: No    Alcohol/week: 0.0 standard drinks   Drug use: No   Sexual activity: Not Currently  Other Topics Concern   Not on file  Social History Narrative   08/28/19    From: Born in [place], most recently from [place]: with husband, Quillian Quince since 2006   Work: for [employer], but now outsource with merger      Family: parents in [place], siblings all over the country       Enjoys: house projects, building out the tobacco barn, gardening      Exercise: just working in the yard, was walking   Diet: overeating with stress, does cook at home 95%      Safety   Seat belts: Yes    Guns: Yes  and secure   Safe in relationships: Yes    Social Determinants of Radio broadcast assistant Strain: Not on file  Food Insecurity: Not on file  Transportation Needs: Not on file  Physical Activity: Not on file  Stress: Not on file  Social Connections: Not on file  Intimate Partner Violence: Not on file   Review of Systems Sleeping better with weighted blanket    Objective:   Physical Exam Constitutional:      Appearance: Normal appearance.  Neurological:     Mental Status: She is alert.  Psychiatric:        Mood and Affect: Mood normal.        Behavior: Behavior normal.           Assessment & Plan:

## 2021-04-26 NOTE — Assessment & Plan Note (Signed)
No problems on the lowest dose of mounjaro Will increase to 5mg  weekly Will have her contact me after 4 weeks---and we can increase again

## 2021-04-26 NOTE — Patient Instructions (Signed)
Please let me know ow things are going after 4 doses of the 5mg  injection--by email. Then we can decide about further increases.

## 2021-04-27 ENCOUNTER — Other Ambulatory Visit: Payer: Self-pay

## 2021-05-19 ENCOUNTER — Encounter: Payer: Self-pay | Admitting: Internal Medicine

## 2021-05-20 ENCOUNTER — Other Ambulatory Visit: Payer: Self-pay

## 2021-05-20 MED ORDER — TIRZEPATIDE 7.5 MG/0.5ML ~~LOC~~ SOAJ
7.5000 mg | SUBCUTANEOUS | 3 refills | Status: DC
Start: 2021-05-20 — End: 2021-08-02
  Filled 2021-05-20: qty 6, 84d supply, fill #0
  Filled 2021-05-27: qty 2, 28d supply, fill #0
  Filled 2021-06-29: qty 2, 28d supply, fill #1
  Filled 2021-07-28 (×2): qty 2, 28d supply, fill #2

## 2021-05-24 ENCOUNTER — Other Ambulatory Visit: Payer: Self-pay

## 2021-05-25 ENCOUNTER — Other Ambulatory Visit: Payer: Self-pay | Admitting: Family Medicine

## 2021-05-25 DIAGNOSIS — I10 Essential (primary) hypertension: Secondary | ICD-10-CM

## 2021-05-25 DIAGNOSIS — E782 Mixed hyperlipidemia: Secondary | ICD-10-CM

## 2021-05-25 DIAGNOSIS — F418 Other specified anxiety disorders: Secondary | ICD-10-CM

## 2021-05-26 ENCOUNTER — Other Ambulatory Visit: Payer: Self-pay

## 2021-05-27 ENCOUNTER — Other Ambulatory Visit: Payer: Self-pay

## 2021-06-29 ENCOUNTER — Other Ambulatory Visit: Payer: Self-pay

## 2021-07-28 ENCOUNTER — Encounter: Payer: Self-pay | Admitting: Family Medicine

## 2021-07-28 ENCOUNTER — Other Ambulatory Visit (HOSPITAL_COMMUNITY): Payer: Self-pay

## 2021-07-28 ENCOUNTER — Other Ambulatory Visit: Payer: Self-pay

## 2021-07-29 NOTE — Telephone Encounter (Signed)
Ms. Denherder called in and wanted to knoe the status of the medication due to she sent a message to the doctor yesterday and havent heard anything about the medication

## 2021-08-02 ENCOUNTER — Encounter: Payer: Self-pay | Admitting: Family Medicine

## 2021-08-02 MED ORDER — TIRZEPATIDE 10 MG/0.5ML ~~LOC~~ SOAJ: 10.0000 mg | SUBCUTANEOUS | 0 refills | Status: DC

## 2021-08-02 MED ORDER — TIRZEPATIDE 7.5 MG/0.5ML ~~LOC~~ SOAJ: 7.5000 mg | SUBCUTANEOUS | 0 refills | Status: DC

## 2021-08-02 NOTE — Addendum Note (Signed)
Addended by: Lesleigh Noe on: 08/02/2021 04:27 PM   Modules accepted: Orders

## 2021-08-06 ENCOUNTER — Other Ambulatory Visit: Payer: Self-pay

## 2021-08-06 ENCOUNTER — Ambulatory Visit (INDEPENDENT_AMBULATORY_CARE_PROVIDER_SITE_OTHER): Payer: BC Managed Care – PPO | Admitting: Family Medicine

## 2021-08-06 VITALS — BP 110/62 | HR 76 | Temp 97.9°F | Ht 64.0 in | Wt 263.1 lb

## 2021-08-06 DIAGNOSIS — R002 Palpitations: Secondary | ICD-10-CM

## 2021-08-06 DIAGNOSIS — I10 Essential (primary) hypertension: Secondary | ICD-10-CM | POA: Diagnosis not present

## 2021-08-06 NOTE — Assessment & Plan Note (Signed)
Blood pressure slightly low, discussed decreasing atenolol to 25 mg daily. ?

## 2021-08-06 NOTE — Assessment & Plan Note (Signed)
Doing well Mounjaro, continuing healthy diet.  She is getting ready to start the 10 mg dose.  She has lost slightly more than 5% of her body weight, appropriate to continue. ?

## 2021-08-06 NOTE — Progress Notes (Signed)
? ?Subjective:  ? ?  ?Elizabeth Fleming is a 56 y.o. female presenting for Follow-up Elizabeth Fleming ) ?  ? ? ?HPI ? ?#Weight loss ?- on mounjaro ?- side effects - tolerating OK, some constipation - taking a stool softener to help ?- helping her to make the right choices about food ?- feels in control around food  ?- eating healthier - more veggies, lean meats ?- insurance is paying for it ?- will miss a couple of weeks of medication ? ?#HTN ?- has a bp machine at home ? ?#Anxiety/depression ?- doing well on zoloft ?- almost no treatment with xanax ? ?Review of Systems ? ? ?Social History  ? ?Tobacco Use  ?Smoking Status Former  ? Packs/day: 1.00  ? Years: 15.00  ? Pack years: 15.00  ? Types: Cigarettes  ? Quit date: 05/2007  ? Years since quitting: 14.2  ?Smokeless Tobacco Never  ? ? ? ?   ?Objective:  ?  ?BP Readings from Last 3 Encounters:  ?08/06/21 110/62  ?04/26/21 110/74  ?04/06/21 118/80  ? ?Wt Readings from Last 3 Encounters:  ?08/06/21 263 lb 2 oz (119.4 kg)  ?04/26/21 276 lb (125.2 kg)  ?04/06/21 278 lb 12.8 oz (126.5 kg)  ? ? ?BP 110/62   Pulse 76   Temp 97.9 ?F (36.6 ?C) (Oral)   Ht 5\' 4"  (1.626 m)   Wt 263 lb 2 oz (119.4 kg)   LMP 12/24/2017 (Exact Date)   SpO2 98%   BMI 45.17 kg/m?  ? ? ?Physical Exam ?Constitutional:   ?   General: She is not in acute distress. ?   Appearance: She is well-developed. She is not diaphoretic.  ?HENT:  ?   Right Ear: External ear normal.  ?   Left Ear: External ear normal.  ?   Nose: Nose normal.  ?Eyes:  ?   Conjunctiva/sclera: Conjunctivae normal.  ?Cardiovascular:  ?   Rate and Rhythm: Normal rate and regular rhythm.  ?   Heart sounds: No murmur heard. ?Pulmonary:  ?   Effort: Pulmonary effort is normal. No respiratory distress.  ?   Breath sounds: Normal breath sounds.  ?Musculoskeletal:  ?   Cervical back: Neck supple.  ?Skin: ?   General: Skin is warm and dry.  ?   Capillary Refill: Capillary refill takes less than 2 seconds.  ?Neurological:  ?   Mental Status:  She is alert. Mental status is at baseline.  ?Psychiatric:     ?   Mood and Affect: Mood normal.     ?   Behavior: Behavior normal.  ? ? ? ? ? ?   ?Assessment & Plan:  ? ?Problem List Items Addressed This Visit   ? ?  ? Cardiovascular and Mediastinum  ? Essential hypertension - Primary  ?  Blood pressure slightly low, discussed decreasing atenolol to 25 mg daily. ?  ?  ?  ? Other  ? Morbid obesity ([place]) (Chronic)  ?  Doing well Mounjaro, continuing healthy diet.  She is getting ready to start the 10 mg dose.  She has lost slightly more than 5% of her body weight, appropriate to continue. ?  ?  ? Palpitations  ?  Decreasing atenolol due to lightheadedness and low blood pressure.  Advised monitoring for worsening palpitations ?  ?  ? ? ? ?Return in about 1 month (around 09/06/2021) for before you move. ? ?Lesleigh Noe, MD ? ?This visit occurred during the SARS-CoV-2 public health emergency.  Safety protocols were in place, including screening questions prior to the visit, additional usage of staff PPE, and extensive cleaning of exam room while observing appropriate contact time as indicated for disinfecting solutions.  ? ?

## 2021-08-06 NOTE — Assessment & Plan Note (Signed)
Decreasing atenolol due to lightheadedness and low blood pressure.  Advised monitoring for worsening palpitations ?

## 2021-08-06 NOTE — Patient Instructions (Signed)
#  Heart rate/Blood pressure ?- reduce atenolol to 1/2 tablet (25 mg) ?- if blood pressure goes beyond 140/90 - return to previous dose ?- if palpitations return increase back to original dose ? ? ?Please check your blood pressure 2-4 times a week.  ? ?To check your blood pressure ?1) Sit in a quiet and relaxed place for 5 minutes ?2) Make sure your feet are flat on the ground ?3) Consider checking first thing in the morning  ? ?Normal blood pressure is less than 140/90 ?Ideally you blood pressure should be around 120/80 ? ?

## 2021-08-11 ENCOUNTER — Encounter: Payer: Self-pay | Admitting: Emergency Medicine

## 2021-08-11 ENCOUNTER — Encounter: Payer: Self-pay | Admitting: Family Medicine

## 2021-08-11 ENCOUNTER — Ambulatory Visit
Admission: EM | Admit: 2021-08-11 | Discharge: 2021-08-11 | Disposition: A | Discharge status: Home / Self Care | Payer: BC Managed Care – PPO | Attending: Internal Medicine | Admitting: Internal Medicine

## 2021-08-11 ENCOUNTER — Other Ambulatory Visit: Payer: Self-pay

## 2021-08-11 DIAGNOSIS — N10 Acute tubulo-interstitial nephritis: Secondary | ICD-10-CM | POA: Diagnosis not present

## 2021-08-11 LAB — POCT URINALYSIS DIP (MANUAL ENTRY)
Bilirubin, UA: NEGATIVE
Glucose, UA: NEGATIVE mg/dL
Ketones, POC UA: NEGATIVE mg/dL
Nitrite, UA: NEGATIVE
Protein Ur, POC: 100 mg/dL — AB
Spec Grav, UA: 1.02 (ref 1.010–1.025)
Urobilinogen, UA: 0.2 E.U./dL
pH, UA: 7 (ref 5.0–8.0)

## 2021-08-11 MED ORDER — LEVOFLOXACIN 500 MG PO TABS: 500.0000 mg | ORAL_TABLET | Freq: Every day | ORAL | 0 refills | Status: DC

## 2021-08-11 MED ORDER — ONDANSETRON 4 MG PO TBDP: 4.0000 mg | ORAL_TABLET | Freq: Three times a day (TID) | ORAL | 0 refills | Status: AC | PRN

## 2021-08-11 MED ORDER — LEVOFLOXACIN 750 MG PO TABS: 750.0000 mg | ORAL_TABLET | Freq: Every day | ORAL | 0 refills | Status: AC

## 2021-08-11 NOTE — Discharge Instructions (Signed)
Increase oral fluid intake ?Take antibiotics as prescribed ?We will call you with recommendations if labs are abnormal ?Return to urgent care if you have worsening symptoms such as persistent fever, nausea or vomiting. ?

## 2021-08-11 NOTE — ED Triage Notes (Signed)
Pt here with bilateral lumbar/thoracic back pain with nausea and bloated since yesterday.  ?

## 2021-08-11 NOTE — ED Provider Notes (Signed)
Elizabeth Fleming    CSN: [id number] Arrival date & time: 08/11/21  1808      History   Chief Complaint Chief Complaint  Patient presents with   Back Pain   Nausea   Bloated    HPI Elizabeth Fleming is a 56 y.o. female comes to the urgent care with a 1 day history of right flank pain, nausea and abdominal bloating.  Symptoms started yesterday and has worsened this morning.  She denies any dysuria urgency or frequency.  No blood in the urine.  Pain is throbbing, constant with no relieving factors.  Pain radiates to the right groin. She denies any nausea or vomiting.  No history of UTI or pyelonephritis.   HPI  Past Medical History:  Diagnosis Date   Allergy    CAD (coronary artery disease)    a. 03/2018 Cardiac CTA: Sev RCA dzs (FFR 0.74p/m, 0.66d); b. 04/2018 Cath/PCI: LM nl, LAD min irregs, D1/2 nl, LCX 20ost (initially 60%-->improved w/ IC ntg), OM1/2/3 nl, RCA 65ost (4.0x12 [place] DES), RPDA/RPAV nl, EF 55-65%.   Colon polyps 2012   Depression    Genital warts    Hyperlipidemia    Hypertension    Migraines    Prediabetes     Patient Active Problem List   Diagnosis Date Noted   Allergy to meat 38/03/1750   Metallic taste 02/58/5277   Foamy urine 08/19/2020   Dysuria 10/18/2019   CAD (coronary artery disease) 12/30/2018   PTSD (post-traumatic stress disorder) 11/05/2018   Insomnia 11/05/2018   Gastroenteritis 08/06/2018   Abnormal cardiac CT angiography 04/26/2018   Accelerating angina ([place]) 04/10/2018   Palpitations 01/03/2018   Personal history of colonic polyps    Polyp of sigmoid colon    Diverticulosis of large intestine without diverticulitis    Hypertrophied anal papilla    Intestinal lump    ETD (Eustachian tube dysfunction), bilateral 01/26/2017   Pre-diabetes 05/17/2016   Adjustment disorder 06/17/2014   Irregular periods 08/12/2013   Depression with anxiety 03/05/2013   GERD 02/22/2010   Morbid obesity ([place]) 12/01/2009   INCONTINENCE, URGE  12/01/2009   ALLERGIC RHINITIS 11/18/2007   HLD (hyperlipidemia) 11/16/2007   Essential hypertension 11/16/2007    Past Surgical History:  Procedure Laterality Date   CHOLECYSTECTOMY     COLONOSCOPY WITH PROPOFOL N/A 11/03/2017   Procedure: COLONOSCOPY WITH PROPOFOL;  Surgeon: Virgel Manifold, MD;  Location: [facility];  Service: Endoscopy;  Laterality: N/A;   FOOT SURGERY     right-removal of some calcifications between the big toe and second toe   HAMMER TOE SURGERY     right   INTRAVASCULAR ULTRASOUND/IVUS N/A 04/26/2018   Procedure: Intravascular Ultrasound/IVUS;  Surgeon: Nelva Bush, MD;  Location: [facility];  Service: Cardiovascular;  Laterality: N/A;   LEFT HEART CATH AND CORONARY ANGIOGRAPHY N/A 04/26/2018   Procedure: LEFT HEART CATH AND CORONARY ANGIOGRAPHY;  Surgeon: Nelva Bush, MD;  Location: [facility];  Service: Cardiovascular;  Laterality: N/A;   TONSILLECTOMY  1974    OB History   No obstetric history on file.      Home Medications    Prior to Admission medications   Medication Sig Start Date End Date Taking? Authorizing Provider  ondansetron (ZOFRAN-ODT) 4 MG disintegrating tablet Take 1 tablet (4 mg total) by mouth every 8 (eight) hours as needed for nausea or vomiting. 08/11/21  Yes Carlee Tesfaye, Myrene Galas, MD  ALPRAZolam Duanne Moron) 0.25 MG tablet Take 1 tablet (0.25  mg total) by mouth 2 (two) times daily as needed for anxiety. 11/05/18   Lucille Passy, MD  aspirin 81 MG chewable tablet Chew 1 tablet (81 mg total) by mouth daily. 04/27/18   Kathyrn Drown D, NP  atenolol (TENORMIN) 50 MG tablet TAKE 1 TABLET DAILY 05/27/21   Lesleigh Noe, MD  atorvastatin (LIPITOR) 40 MG tablet TAKE 1 TABLET DAILY 05/27/21   Lesleigh Noe, MD  cetirizine (ZYRTEC) 10 MG tablet Take 1 tablet (10 mg total) by mouth daily. 01/26/17   Lucille Passy, MD  fish oil-omega-3 fatty acids 1000 MG capsule Take 1 g by mouth daily.    [provider]   levofloxacin (LEVAQUIN) 750 MG tablet Take 1 tablet (750 mg total) by mouth daily for 5 days. 08/11/21 08/16/21  Chase Picket, MD  sertraline (ZOLOFT) 50 MG tablet TAKE 1 TABLET DAILY 05/27/21   Lesleigh Noe, MD  tirzepatide [facility]) 10 MG/0.5ML Pen Inject 10 mg into the skin once a week. 08/02/21   Lesleigh Noe, MD    Family History Family History  Adopted: Yes  Problem Relation Age of Onset   Stroke Mother    Coronary artery disease Sister 47       1/2 sister   Breast cancer Neg Hx     Social History Social History   Tobacco Use   Smoking status: Former    Packs/day: 1.00    Years: 15.00    Pack years: 15.00    Types: Cigarettes    Quit date: 05/2007    Years since quitting: 14.2   Smokeless tobacco: Never  Vaping Use   Vaping Use: Never used  Substance Use Topics   Alcohol use: No    Alcohol/week: 0.0 standard drinks   Drug use: No     Allergies   Shellfish allergy   Review of Systems Review of Systems  Constitutional:  Negative for chills and fever.  HENT: Negative.    Gastrointestinal:  Positive for abdominal pain.  Genitourinary:  Positive for flank pain. Negative for dyspareunia, dysuria, urgency and vaginal discharge.  Neurological: Negative.     Physical Exam Triage Vital Signs ED Triage Vitals  Enc Vitals Group     BP 08/11/21 1830 (!) 145/87     Pulse Rate 08/11/21 1830 80     Resp 08/11/21 1830 18     Temp 08/11/21 1830 98.7 F (37.1 C)     Temp Source 08/11/21 1830 Oral     SpO2 08/11/21 1830 96 %     Weight --      Height --      Head Circumference --      Peak Flow --      Pain Score 08/11/21 1838 7     Pain Loc --      Pain Edu? --      Excl. in [place]? --    No data found.  Updated Vital Signs BP (!) 145/87 (BP Location: Left Arm)    Pulse 80    Temp 98.7 F (37.1 C) (Oral)    Resp 18    LMP 12/24/2017 (Exact Date)    SpO2 96%   Visual Acuity Right Eye Distance:   Left Eye Distance:   Bilateral Distance:    Right  Eye Near:   Left Eye Near:    Bilateral Near:     Physical Exam Vitals and nursing note reviewed.  Constitutional:      General: She  is not in acute distress.    Appearance: She is not ill-appearing or diaphoretic.  Cardiovascular:     Rate and Rhythm: Normal rate and regular rhythm.     Pulses: Normal pulses.     Heart sounds: Normal heart sounds.  Pulmonary:     Effort: Pulmonary effort is normal.     Breath sounds: Normal breath sounds.  Abdominal:     Tenderness: There is no abdominal tenderness. There is no guarding.     Comments: Right CVA tenderness present.  Neurological:     Mental Status: She is alert.     UC Treatments / Results  Labs (all labs ordered are listed, but only abnormal results are displayed) Labs Reviewed  POCT URINALYSIS DIP (MANUAL ENTRY) - Abnormal; Notable for the following components:      Result Value   Clarity, UA cloudy (*)    Blood, UA large (*)    Protein Ur, POC =100 (*)    Leukocytes, UA Moderate (2+) (*)    All other components within normal limits  URINE CULTURE    EKG   Radiology No results found.  Procedures Procedures (including critical care time)  Medications Ordered in UC Medications - No data to display  Initial Impression / Assessment and Plan / UC Course  I have reviewed the triage vital signs and the nursing notes.  Pertinent labs & imaging results that were available during my care of the patient were reviewed by me and considered in my medical decision making (see chart for details).     1.  Acute pyelonephritis: Point-of-care urinalysis is positive for blood, protein and leukocyte Estrace Urine cultures have been sent Levaquin 750 mg orally daily for 5 days Increase oral fluid intake If symptoms worsen please return to urgent care. Final Clinical Impressions(s) / UC Diagnoses   Final diagnoses:  Acute pyelonephritis     Discharge Instructions      Increase oral fluid intake Take antibiotics as  prescribed We will call you with recommendations if labs are abnormal Return to urgent care if you have worsening symptoms such as persistent fever, nausea or vomiting.   ED Prescriptions     Medication Sig Dispense Auth. Provider   levofloxacin (LEVAQUIN) 500 MG tablet  (Status: Discontinued) Take 1 tablet (500 mg total) by mouth daily for 5 days. 5 tablet Shya Kovatch, Myrene Galas, MD   levofloxacin (LEVAQUIN) 750 MG tablet Take 1 tablet (750 mg total) by mouth daily for 5 days. 5 tablet Kaylyn Garrow, Myrene Galas, MD   ondansetron (ZOFRAN-ODT) 4 MG disintegrating tablet Take 1 tablet (4 mg total) by mouth every 8 (eight) hours as needed for nausea or vomiting. 20 tablet Richardson Dubree, Myrene Galas, MD      PDMP not reviewed this encounter.   Chase Picket, MD 08/11/21 1945

## 2021-08-14 LAB — URINE CULTURE: Culture: >=100000 — AB

## 2021-08-25 ENCOUNTER — Other Ambulatory Visit: Payer: Self-pay

## 2021-08-25 ENCOUNTER — Ambulatory Visit (INDEPENDENT_AMBULATORY_CARE_PROVIDER_SITE_OTHER): Payer: BC Managed Care – PPO | Admitting: Family Medicine

## 2021-08-25 DIAGNOSIS — H6981 Other specified disorders of Eustachian tube, right ear: Secondary | ICD-10-CM

## 2021-08-25 DIAGNOSIS — K5900 Constipation, unspecified: Secondary | ICD-10-CM | POA: Diagnosis not present

## 2021-08-25 DIAGNOSIS — H6991 Unspecified Eustachian tube disorder, right ear: Secondary | ICD-10-CM

## 2021-08-25 NOTE — Assessment & Plan Note (Signed)
Exam reassuring, discussed trial of Flonase in addition to allergy medication.  Follow-up if no improvement ?

## 2021-08-25 NOTE — Progress Notes (Signed)
? ?Subjective:  ? ?  ?Elizabeth Fleming is a 56 y.o. female presenting for Follow-up (To UC visit/UTI), Bloated (X 2 weeks ), and Ear Pain (R) ?  ? ? ?HPI ? ?#Bloating ?- continues to have this symptom ?- back pain improved ?- treated for pyleonephritis ?- urgency improved ?- no dysuria then or now ?- some constipation, hard stools - is taking red gel pill otc ?- eating fiber one cereal and doing yogurt daily ?- is on mounjaro - just increased to 10 mg 3-4 weeks ago ? ? ?#Ear pain ?- right side ?- fullness ?- some pain ?- no drainage, hearing loss ?- no congestion, ha, runny nose, cough ?- started Sunday ?- no watery/itchy eyes ? ? ?Review of Systems ? ? ?Social History  ? ?Tobacco Use  ?Smoking Status Former  ? Packs/day: 1.00  ? Years: 15.00  ? Pack years: 15.00  ? Types: Cigarettes  ? Quit date: 05/2007  ? Years since quitting: 14.3  ?Smokeless Tobacco Never  ? ? ? ?   ?Objective:  ?  ?BP Readings from Last 3 Encounters:  ?08/25/21 128/82  ?08/11/21 (!) 145/87  ?08/06/21 110/62  ? ?Wt Readings from Last 3 Encounters:  ?08/25/21 267 lb 1 oz (121.1 kg)  ?08/06/21 263 lb 2 oz (119.4 kg)  ?04/26/21 276 lb (125.2 kg)  ? ? ?BP 128/82   Pulse 73   Temp 98.1 ?F (36.7 ?C) (Oral)   Ht '5\' 4"'$  (1.626 m)   Wt 267 lb 1 oz (121.1 kg)   LMP 12/24/2017 (Exact Date)   SpO2 98%   BMI 45.84 kg/m?  ? ? ?Physical Exam ?Constitutional:   ?   General: She is not in acute distress. ?   Appearance: She is well-developed. She is not diaphoretic.  ?HENT:  ?   Right Ear: Tympanic membrane and external ear normal.  ?   Left Ear: Tympanic membrane and external ear normal.  ?   Nose: Nose normal.  ?Eyes:  ?   Conjunctiva/sclera: Conjunctivae normal.  ?Cardiovascular:  ?   Rate and Rhythm: Normal rate and regular rhythm.  ?   Heart sounds: No murmur heard. ?Pulmonary:  ?   Effort: Pulmonary effort is normal. No respiratory distress.  ?   Breath sounds: Normal breath sounds. No wheezing.  ?Abdominal:  ?   General: Abdomen is flat. Bowel  sounds are normal. There is no distension.  ?   Palpations: Abdomen is soft.  ?   Tenderness: There is no abdominal tenderness. There is no right CVA tenderness, left CVA tenderness, guarding or rebound. Negative signs include Murphy's sign.  ?Musculoskeletal:  ?   Cervical back: Neck supple.  ?Skin: ?   General: Skin is warm and dry.  ?   Capillary Refill: Capillary refill takes less than 2 seconds.  ?Neurological:  ?   Mental Status: She is alert. Mental status is at baseline.  ?Psychiatric:     ?   Mood and Affect: Mood normal.     ?   Behavior: Behavior normal.  ? ? ? ? ? ?   ?Assessment & Plan:  ? ?Problem List Items Addressed This Visit   ? ?  ? Nervous and Auditory  ? Eustachian tube dysfunction, right  ?  Exam reassuring, discussed trial of Flonase in addition to allergy medication.  Follow-up if no improvement ?  ?  ?  ? Other  ? Morbid obesity ([place]) - Primary (Chronic)  ?  Currently on [place],  was recently increased to 10 mg.  Discussed this does seem to be associated with her worsening bloating which may be the cause.  At this time she does not want to decrease the dose so we will try to treat constipation and consider dose increase if no improvement. ?  ?  ? Constipation  ?  With bloating, already taking what sounds like Colace.  Advised adding fiber supplement and increasing water intake.  Handout for constipation provided.  If no improvement consider dose decrease of Mounjaro. ?  ?  ? ? ? ?Return if symptoms worsen or fail to improve. ? ?Lesleigh Noe, MD ? ?This visit occurred during the SARS-CoV-2 public health emergency.  Safety protocols were in place, including screening questions prior to the visit, additional usage of staff PPE, and extensive cleaning of exam room while observing appropriate contact time as indicated for disinfecting solutions.  ? ?

## 2021-08-25 NOTE — Assessment & Plan Note (Signed)
Currently on Mounjaro, was recently increased to 10 mg.  Discussed this does seem to be associated with her worsening bloating which may be the cause.  At this time she does not want to decrease the dose so we will try to treat constipation and consider dose increase if no improvement. ?

## 2021-08-25 NOTE — Patient Instructions (Signed)
?  What you can do to treat your symptoms ?1) Fiber ?-- Eat more fiber rich foods: beans, broccoli, berries, avocados, popcorn, pear/apple, green peas, turnip greens, brussels sprouts, whole grains (barley, bran, quinoa, oatmeal) ?-- Take a Fiber supplement: Psyllium (Metamucil)  ?-- Could also eat Prunes daily ? ?2) Hydration  ?-- Drink more water: Try to drink 64 oz of water per day ? ?3) Exercise ?-- Moderate exercise (walking, jogging, biking) for 30 minutes, 5 days a week ? ?4) Dedicate time for Bowel movements - do not delay ? ?5) Stool Softener  ?- Docusate Sodium (Colace) 100 mg daily or twice daily as needed ? ? ?If 4-6 weeks have passed and the above has not helped then start the following ?6) Laxatives ?-- Polyethylene Glycol (Miralax) - begin with once daily. After a few days can increase to twice daily ? ? ?Treating chronic constipation is often about finding the right amount of medication and fiber to keep you regular and comfortable. For some people that may be daily metamucil and colace every other day. For others it may be Metamucil and colace twice daily and Miralax 3 times a week. The goal is to go slow and listen to your body. And normal can be anywhere from 2-3 soft bowel movements a day to 1 bowel movement every 2-3 days.  ? ? ?Mychart if you decide you want to decrease the dose of mounjaro ? ?Otherwise - focus on hydration and fiber ? ?

## 2021-08-25 NOTE — Assessment & Plan Note (Signed)
With bloating, already taking what sounds like Colace.  Advised adding fiber supplement and increasing water intake.  Handout for constipation provided.  If no improvement consider dose decrease of Mounjaro. ?

## 2021-08-25 NOTE — Assessment & Plan Note (Signed)
Associated Problem(s): Eustachian tube dysfunction, right  Formatting of this note might be different from the original.  Exam reassuring, discussed trial of Flonase in addition to allergy medication.  Follow-up if no improvement  Electronically signed by Lynnda Child, MD at 08/25/2021 11:59 AM EDT

## 2021-08-25 NOTE — Assessment & Plan Note (Signed)
Associated Problem(s): Morbid obesity (HCC)  Formatting of this note might be different from the original.  Currently on [facility], was recently increased to 10 mg.  Discussed this does seem to be associated with her worsening bloating which may be the cause.  At this time she does not want to decrease the dose so we will try to treat constipation and consider dose increase if no improvement.  Electronically signed by Lynnda Child, MD at 08/25/2021 11:59 AM EDT

## 2021-08-25 NOTE — Progress Notes (Signed)
Formatting of this note is different from the original.  Images from the original note were not included.      Subjective:       Anna Miller is a 56 y.o. female presenting for Follow-up (To UC visit/UTI), Bloated (X 2 weeks ), and Ear Pain (R)      HPI    #Bloating  - continues to have this symptom  - back pain improved  - treated for pyleonephritis  - urgency improved  - no dysuria then or now  - some constipation, hard stools - is taking red gel pill otc  - eating fiber one cereal and doing yogurt daily  - is on mounjaro - just increased to 10 mg 3-4 weeks ago    #Ear pain  - right side  - fullness  - some pain  - no drainage, hearing loss  - no congestion, ha, runny nose, cough  - started Sunday  - no watery/itchy eyes    Review of Systems    Social History     Tobacco Use   Smoking Status Former    Packs/day: 1.00    Years: 15.00    Pack years: 15.00    Types: Cigarettes    Quit date: 05/2007    Years since quitting: 14.3   Smokeless Tobacco Never       Objective:     BP Readings from Last 3 Encounters:   08/25/21 128/82   08/11/21 (!) 145/87   08/06/21 110/62     Wt Readings from Last 3 Encounters:   08/25/21 267 lb 1 oz (121.1 kg)   08/06/21 263 lb 2 oz (119.4 kg)   04/26/21 276 lb (125.2 kg)     BP 128/82   Pulse 73   Temp 98.1 F (36.7 C) (Oral)   Ht 5\' 4"  (1.626 m)   Wt 267 lb 1 oz (121.1 kg)   LMP 12/24/2017 (Exact Date)   SpO2 98%   BMI 45.84 kg/m     Physical Exam  Constitutional:       General: She is not in acute distress.     Appearance: She is well-developed. She is not diaphoretic.   HENT:      Right Ear: Tympanic membrane and external ear normal.      Left Ear: Tympanic membrane and external ear normal.      Nose: Nose normal.   Eyes:      Conjunctiva/sclera: Conjunctivae normal.   Cardiovascular:      Rate and Rhythm: Normal rate and regular rhythm.      Heart sounds: No murmur heard.  Pulmonary:      Effort: Pulmonary effort is normal. No respiratory distress.      Breath sounds:  Normal breath sounds. No wheezing.   Abdominal:      General: Abdomen is flat. Bowel sounds are normal. There is no distension.      Palpations: Abdomen is soft.      Tenderness: There is no abdominal tenderness. There is no right CVA tenderness, left CVA tenderness, guarding or rebound. Negative signs include Murphy's sign.   Musculoskeletal:      Cervical back: Neck supple.   Skin:     General: Skin is warm and dry.      Capillary Refill: Capillary refill takes less than 2 seconds.   Neurological:      Mental Status: She is alert. Mental status is at baseline.   Psychiatric:  Mood and Affect: Mood normal.         Behavior: Behavior normal.       Assessment & Plan:     Problem List Items Addressed This Visit         Nervous and Auditory    Eustachian tube dysfunction, right     Exam reassuring, discussed trial of Flonase in addition to allergy medication.  Follow-up if no improvement         Other    Morbid obesity (HCC) - Primary (Chronic)     Currently on Mounjaro, was recently increased to 10 mg.  Discussed this does seem to be associated with her worsening bloating which may be the cause.  At this time she does not want to decrease the dose so we will try to treat constipation and consider dose increase if no improvement.       Constipation     With bloating, already taking what sounds like Colace.  Advised adding fiber supplement and increasing water intake.  Handout for constipation provided.  If no improvement consider dose decrease of Mounjaro.        Return if symptoms worsen or fail to improve.    Lynnda Child, MD    This visit occurred during the SARS-CoV-2 public health emergency.  Safety protocols were in place, including screening questions prior to the visit, additional usage of staff PPE, and extensive cleaning of exam room while observing appropriate contact time as indicated for disinfecting solutions.     Electronically signed by Lynnda Child, MD at 08/25/2021 12:00 PM EDT

## 2021-08-25 NOTE — Assessment & Plan Note (Signed)
Associated Problem(s): Constipation  Formatting of this note might be different from the original.  With bloating, already taking what sounds like Colace.  Advised adding fiber supplement and increasing water intake.  Handout for constipation provided.  If no improvement consider dose decrease of Mounjaro.  Electronically signed by Lynnda Child, MD at 08/25/2021 12:00 PM EDT

## 2021-09-21 ENCOUNTER — Encounter: Payer: Self-pay | Admitting: Family Medicine

## 2021-09-21 MED ORDER — TIRZEPATIDE 10 MG/0.5ML ~~LOC~~ SOAJ: 10.0000 mg | SUBCUTANEOUS | 0 refills | Status: AC

## 2022-05-02 NOTE — Telephone Encounter (Signed)
Attended Surgical Info Session on 04/27/22.    Verified  Insurance Benefit   with  [employer]    Patient informed the following:    This is NOT a guarantee of payment  When stating that you have a "Benefit" or "Coverage" for Bariatric Surgery - that means that you may qualify for the surgery  Bariatric Surgery is considered an elective procedure, patient is responsible to know their benefits . Any information we obtain when calling your insurance  is not  a guarantee of  coverage  and/or  benefit.        Appointment Note :   New Patient , [employer],   3   month visits,  PG Fee $200,  Advise to bring completed new    patient  packet.      Remind  Patient of  $200  Program fee with  $ 100  Required at  Second visit with office on initial dietician visit.     Remind Patient they must be nicotine free. They will be tested at the beginning of the program and prior to surgery.      Advise  Patient  Responsible for out of pocket, copay at medical visits,  Deductible and coinsurance applied to medical visits and procedure.    You will be responsible for any of the following:  Copays $30.00  Deductibles $250.00  Co insurances: $2,000    The items mentioned above are  indicated or required by your insurance plan. Your deductible and coinsurance are applied to medical visits and procedures.     Verified with patient if he or she has had any previous bariatric surgery? no  ( If yes ,advise patient of transfer of care process and program fee)

## 2022-06-01 ENCOUNTER — Ambulatory Visit: Admit: 2022-06-01 | Discharge: 2022-06-01 | Payer: BLUE CROSS/BLUE SHIELD | Attending: Surgery | Primary: Primary Care

## 2022-06-01 DIAGNOSIS — G4733 Obstructive sleep apnea (adult) (pediatric): Secondary | ICD-10-CM

## 2022-06-01 NOTE — Progress Notes (Unsigned)
[facility], [facility]  [place] MIN INVASIVE BARIATRIC SURG  [address]  [address]  [place] [address]  Dept: [phone]    SURGICAL WEIGHT MANAGEMENT PROGRAM  PROGRESS NOTE INITIAL EVALUATION     Patient: Anna Miller        Service Date: 06/01/2022      HPI:     Chief Complaint   Patient presents with    Bariatric, Initial Visit     New patient        The patient is a pleasant 56 y.o. year old female  with morbid obesity, who stands Height: 163 cm (5' 4.17") tall with a weight of Weight - Scale: 113.9 kg (251 lb) , resulting in a BMI of Body mass index is 42.85 kg/m.Marland Kitchen The patient suffers from multiple co-morbidities as a result of morbid obesity, including: Hypertension, Hyperlipidemia, Obstructive Sleep Apnea, Prediabetes, and Fatty liver disease. She has suffered from obesity for many years. She has been taking Mounjaro for the past year and was on metformin prior.    The patient denies  a history of myocardial infarction, deep vein thrombosis, pulmonary embolism, renal failure, hepatic failure, and stroke.    The patient has failed multiple attempts at non-surgical weight loss, and is now seeking surgical intervention to promote permanent and consistent weight loss. She  has chosen Roux-en-Y Gastric Bypass.  She is well educated regarding it, as she has recently viewed our weight loss surgery informational seminar .     Medical History:  Past Medical History:   Diagnosis Date    Anxiety     Cataracts, bilateral     Depression     Gallstones     H/O urinary infection     High cholesterol     High triglycerides     History of prediabetes     HTN (hypertension)     Incontinent of urine     Liver disease     OSA (obstructive sleep apnea)     Ringing in the ears        Surgical History:  Past Surgical History:   Procedure Laterality Date    GALLBLADDER SURGERY         Family History:  No family history on file.    Social History:   Social History     Tobacco Use    Smoking  status: Never    Smokeless tobacco: Never   Substance Use Topics    Alcohol use: Yes    Drug use: Never       Current Med List:  Current Outpatient Medications   Medication Sig Dispense Refill    atenolol (TENORMIN) 25 MG tablet Take by mouth      atorvastatin (LIPITOR) 40 MG tablet Take 1 tablet by mouth daily      MOUNJARO 12.5 MG/0.5ML SOPN SC injection       sertraline (ZOLOFT) 50 MG tablet Take 1 tablet by mouth daily      aspirin 81 MG chewable tablet Take 1 tablet by mouth daily       No current facility-administered medications for this visit.          SOCIAL:      This patient is with spouse for the evaluation today.      []  HIV Risk Factors (i.e.) intravenous drug abuser; at risk sexual behavior; received blood products    []  TB Risk Factors (i.e.) Medically underserved, institutional care, foreign born, endemic area; exposure  to active case    []  Hepatitis B&C Risk Factors (i.e.) Received blood transfusion prior to 1992; recreational drug use; high risk sexual behaviors; tattoos or body piercings; contact with blood or needle sticks in the workplace    Comprehension    Ability to grasp concepts and respond to questions:   [x]  High   []  Medium   []  Low    Motivation    [x]  Asks Questions; eager to learn   []  Needs education   []  Extreme anxiety    []  uncooperative   []  Denies need for education    English Speaking Ability    [x]  Speaks English well   [x]  Reads English well   [x]  Understands spoken english    [x]  Understands written English   []  No need for interpretive support      []  Might benefit from interpretive support   []  Interpreter required for all services     REVIEW OF SYSTEMS: (Negative unless marked otherwise)     See review of Systems scanned into media    PRESENT ILLNESS:     Weight Parameters  Weight 113.9 kg (251 lb)   Height 1.63 m (5' 4.17")   BMI Body mass index is 42.85 kg/m.   IBW     EBW               IMMUNIZATION STATUS    There is no immunization history on file for this  patient.    FALLS ASSESSMENT    [x]  LOW RISK FOR FALLS    []  MODERATE RISK FOR FALLS    []  Difficulty walking/selfcare    []  Falls in the past 2 months    []  Suspicion of Clinician    []  Other:       VTE SCREEN    []  Family hx DVT/PE  /   []  Personal hx of DVT/PE    [x]  Denies any family or personal hx of DVT/PE    Physician Review    [x]  Past medical, family, & social history reviewed and discussed with patient.     Review of surgery and post-surgical changes (by surgeon for surgical patients only)    [x]  Lifelong diet expectations reviewed with patient    [x]  Need for lifelong vitamin supplementation reviewed with patient    PHYSICAL EXAMINATION:      BP 122/80   Pulse 78   Ht 1.63 m (5' 4.17")   Wt 113.9 kg (251 lb)   SpO2 99%   BMI 42.85 kg/m     Constitutional:  Vital signs are normal. The patient appears well-developed   HEENT:      Head: Normocephalic. Atraumatic     Eyes: pupils are equal and reactive.  No scleral icterus is present.     Neck: No mass and no thyromegaly present.   Cardiovascular: Normal rate, regular rhythm, S1 normal and S2 normal.  Bilateral pulses present.  Pulmonary/Chest: Effort normal and breath sounds normal. No retractions.  Abdominal: Soft. Normal appearance. There is no organomegaly. No tenderness. There is no rigidity, no rebound, no guarding and no Murphy's sign.   Musculoskeletal:      Right lower leg: Normal. No tenderness and no edema.      Left lower leg: Normal. No tenderness and no edema.   Lymphadenopathy:     No cervical adenopathy, No Exrtemity Adenopathy.   Neurological: The patient is alert and oriented. Moving all four extremities equally, sensation grossly intact bilateral.  Skin: Skin  is warm, dry and intact.   Psychiatric: The patient has a normal mood and affect. Speech is normal and behavior is normal. Judgment and thought content normal. Cognition and memory are normal.     RECOMMENDATIONS:     We spent a great deal of time discussing the risks and  benefits of Roux-en-Y Gastric Bypass, including but not limited to injury to intra-abdominal organs, breakdown of the gastric staple line, the need for re-operative therapy,  prolonged hospitalization,  mechanical ventilation,  and death. We discussed the possibility of bleeding, the need for blood transfusions, blood clots, hospital-acquired and intra-abdominal infection, anastomotic stricture, and worsening GERD.  And we discussed the need for post-operative visit compliance, behavior modifications and diet changes, protein and vitamin supplementation, as well as routine scheduled and dedicated exercise.  I instructed the patient to utilize the exercise log that will be given to them at their fist dietician appointment.  We discussed the potential weight loss benefit of approximately 60-70% of her excess body weight at 12-18 months post-op, as well as the possibility of insufficient weight loss or weight gain after 2 years post-operative time.     Discussed the risk of substance abuse and or nicotine abuse today with patient. They expressed understanding of the risks of abuse of such drugs.    PLAN:       Diagnosis Orders   1. OSA (obstructive sleep apnea)        2. Essential hypertension        3. Morbid obesity with BMI of 40.0-44.9, adult (HCC)               Initial Testing     Primary Procedure: Roux-en-Y Gastric Bypass    Labwork: Initial Pre-surgical Lab Tests (CMP, TSH, Fasting Lipid Profile, Mg, Zinc, Vit B1 (whole blood), Vit B12, 25-OH Vit D, Fe,  Ferritin,  Folate)    Endoscopic Studies: Upper GI Endoscopy for GERD which has been untreated.    Psychological Assessment: Psychological Evaluation and Clearance    Nutrition Assessment: Bariatric Nutrition Assessment and Clearance    Other  Consultations: Medical Clearance for BMI 42    Physician Supervised Diet and Exercise required by the patient's insurance company: 3 months.      Surgical Diet requirement:  2 weeks      Final Testing  Screening Chest  Xray  and EKG within 6 months of date of surgery    Labwork:  Final Lab Tests  within 3 months of date of surgery (CBC, PT/PTT, BMP)     Scribe Attestation:  I, Beatrix Shipper, scribed on behalf of Roney Jaffe, DO.    Electronically signed by Roney Jaffe, DO on 06/01/2022 at 3:26 PM

## 2022-06-14 NOTE — Telephone Encounter (Signed)
Patient received new insurance cards for 2024  ID#  [id number] AB  Grp# [id number]    Per Quillian Quince at [place] [phone]) patient goes through a 3rd party administrator - [employer]    Called [employer] [phone] spoke with Gerrit Friends states patient is covered for bariatric surgery    Deductible $250.00  OOP:  $2000.00  Allowed amount 60/40  Unlimited lifetime benefit  3 con monthly visits.    [employer] goes through [employer]  Phone# [phone]  Fax#  [phone]    Call reference # [id number]    Patient notified and will call back by the end of the week if she would like to make her "initial dietician" visit.

## 2022-06-30 NOTE — Telephone Encounter (Signed)
Patient called back states she's not interested in surgery right now.

## 2022-07-01 NOTE — Telephone Encounter (Signed)
Check list removed

## 2024-01-24 ENCOUNTER — Inpatient Hospital Stay: Admit: 2024-01-24 | Payer: BLUE CROSS/BLUE SHIELD | Primary: Primary Care

## 2024-01-24 ENCOUNTER — Inpatient Hospital Stay: Admit: 2024-01-24 | Discharge: 2024-01-24 | Payer: BLUE CROSS/BLUE SHIELD | Primary: Primary Care

## 2024-01-24 ENCOUNTER — Encounter

## 2024-01-24 VITALS — BP 108/59 | HR 77 | Temp 96.90000°F | Resp 12 | Ht 65.0 in | Wt 260.0 lb

## 2024-01-24 DIAGNOSIS — R945 Abnormal results of liver function studies: Principal | ICD-10-CM

## 2024-01-24 LAB — PROTIME-INR
INR: 0.9
Protime: 12.5 s (ref 11.8–14.6)

## 2024-01-24 LAB — PLATELET COUNT: Platelets: 222 k/uL (ref 150–450)

## 2024-01-24 MED ORDER — GELATIN ABSORBABLE 12-7 MM EX MISC
12-7 | CUTANEOUS | Status: AC
Start: 2024-01-24 — End: 2024-01-24

## 2024-01-24 MED ORDER — MIDAZOLAM HCL (PF) 2 MG/2ML IJ SOLN
2 | INTRAMUSCULAR | Status: AC
Start: 2024-01-24 — End: 2024-01-24

## 2024-01-24 MED ORDER — GELATIN ABSORBABLE 12-7 MM EX MISC
12-7 | CUTANEOUS | Status: DC | PRN
Start: 2024-01-24 — End: 2024-01-27
  Administered 2024-01-24: 17:00:00 1

## 2024-01-24 MED ORDER — ACETAMINOPHEN 325 MG PO TABS
325 | ORAL | Status: DC | PRN
Start: 2024-01-24 — End: 2024-01-27

## 2024-01-24 MED ORDER — FENTANYL CITRATE (PF) 100 MCG/2ML IJ SOLN
100 | INTRAMUSCULAR | Status: AC
Start: 2024-01-24 — End: 2024-01-24

## 2024-01-24 MED ORDER — FENTANYL CITRATE (PF) 100 MCG/2ML IJ SOLN
100 | INTRAMUSCULAR | Status: DC | PRN
Start: 2024-01-24 — End: 2024-01-27
  Administered 2024-01-24: 16:00:00 50 via INTRAVENOUS
  Administered 2024-01-24: 17:00:00 50

## 2024-01-24 MED FILL — FENTANYL CITRATE (PF) 100 MCG/2ML IJ SOLN: 100 MCG/2ML | INTRAMUSCULAR | Qty: 2

## 2024-01-24 MED FILL — SURGIFOAM 12-7 MM EX MISC: 12-7 mm | CUTANEOUS | Qty: 1

## 2024-01-24 MED FILL — MIDAZOLAM HCL (PF) 2 MG/2ML IJ SOLN: 2 mg/mL | INTRAMUSCULAR | Qty: 2

## 2024-01-24 NOTE — Discharge Instructions (Signed)
 RADIOLOGY POST BIOPSY INSTRUCTIONS    If you had IV Sedation:  No alcoholic beverages, no driving, operating machinery, or making legal or important decisions for 24 hours.    Diet:    May resume your usual diet.    Medications:  Use over the counter pain medications as directed on the bottle for pain control.  You may continue your home medications as instructed by the radiologist.    Post-Procedure Activity:    Avoid exercises that use your belly muscles and strenuous activities, such as bicycle riding, jogging, weight lifting, or aerobic exercise, for 1 week or until your doctor says it is okay.  Avoid other heavy work or sports for 2 days.    Limit activities for 24 hours.  Resume normal diet.  You will probably be able to shower the same day as the test, if your doctor says it is okay. Pat the puncture site dry. Do not take a bath for at least 2 days after the test, or until your doctor tells you it is okay.    Puncture Site:  Keep clean and dry for 24-48 hours.    Go to ER if  Swelling occurs around puncture site.  Bleeding occurs at puncture site.  Shortness of breath occurs.  Extreme pain occurs.      IF YOU CANNOT REACH THE DOCTOR, GO TO THE NEAREST EMERGENCY FACILITY.     Phone:  Interventional Radiology  [phone]   Dr Estelle

## 2024-01-24 NOTE — Progress Notes (Signed)
 Patient tolerated liver biopsy without distress.  Dressing to site. Patient returned to room. Nurse updated.

## 2024-01-24 NOTE — H&P (Signed)
 HISTORY and PHYSICAL  [facility]       NAME:  Anna Miller  MRN: [id number]   Date of Birth:  [birth year]   Date: 01/24/2024   Age: 58 y.o.  Gender: female       COMPLAINT AND PRESENT HISTORY:     Anna Miller is 58 y.o.,  female, here for elevated liver enzymes  Patient is scheduled to have: IR liver biopsy with ultrasound    The symptoms started 5 months in April 2025.    Pt states in June she went to GI as new patient. Pt had hx of sl elevated AST, ALT, and was retaken for 3 weeks in a row. She also had complaints of right sided abdominal  pain she was  seen by Anna Miller, GI group.  Pt had  Ultrasound. Pt reports that it showed  fatty liver.  Pt also reports that her liver enzymes were significantly elevated.   Writer unable to obtain any [facility] information for labs and imaging.     Pt continues to  c/o  mild, consistent  right sided abdominal pain .she describes as mild discomfort.   Pt admits to hx of diarrhea, constipation. Pt reports some change in stool color, more yellowish. She admits to nausea.  No change in appetite, no unexplained wt loss.   Pt denies any discoloration in urine.  Pt does complain of immensed pruritus especially to arms.   Pt denies any other symptoms.    Significant medical history reviewed.  Hypertension, hyper lipidemia, OSA - CPAP,  prediabetes, chest heaviness. S/p cardiac cath with x1 stent.     Pt denies any recent chest pain, SOB.  No recent URI, fever or chills.     Denies any hx of blood clots  Any blood thinners /anticoagulants: Aspirin    Patient has been NPO since midnight. No blood thinners in the past 5-7 days.     RECENT LABS, IMAGING AND TESTING     Lab Results   Component Value Date    PLT 222 01/24/2024      No results found for: NA, K, CL, CO2, BUN, CREATININE, GLUCOSE, CALCIUM, LABALBU, BILITOT, ALKPHOS, AST, ALT    No results found.    PAST MEDICAL HISTORY     Past Medical History:   Diagnosis Date    Anxiety      Cataracts, bilateral     Chest heaviness     Depression     Gallstones     H/O urinary infection     High cholesterol     High triglycerides     History of prediabetes     HTN (hypertension)     Incontinent of urine     Liver disease     OSA (obstructive sleep apnea)     uses cpap    Ringing in the ears        SURGICAL HISTORY       Past Surgical History:   Procedure Laterality Date    CATARACT REMOVAL WITH IMPLANT Left 08/2023    COLONOSCOPY      CORONARY ANGIOPLASTY WITH STENT PLACEMENT  2019    x1 stent    GALLBLADDER SURGERY      HAMMER TOE SURGERY Right        FAMILY HISTORY     History reviewed. No pertinent family history.    SOCIAL HISTORY       Social History     Socioeconomic History  Marital status: Married     Spouse name: None    Number of children: None    Years of education: None    Highest education level: None   Tobacco Use    Smoking status: Never    Smokeless tobacco: Never   Substance and Sexual Activity    Alcohol use: Not Currently    Drug use: Never           REVIEW OF SYSTEMS      Allergies   Allergen Reactions    Shellfish Allergy     Iodine Nausea And Vomiting       Current Outpatient Medications on File Prior to Encounter   Medication Sig Dispense Refill    metFORMIN (GLUCOPHAGE) 500 MG tablet Take 1 tablet by mouth 2 times daily (with meals)      atenolol (TENORMIN) 25 MG tablet Take by mouth      atorvastatin (LIPITOR) 40 MG tablet Take 1 tablet by mouth daily      sertraline (ZOLOFT) 50 MG tablet Take 1 tablet by mouth daily      aspirin 81 MG chewable tablet Take 1 tablet by mouth daily       No current facility-administered medications on file prior to encounter.     Notation: Above medications are not currently reconciled at time of signing this H&P note, to be reconciled in pre-op per RN.     Review of Systems   Constitutional: Negative.    HENT:  Negative for congestion and sinus pressure.    Respiratory:  Negative for shortness of breath.    Cardiovascular:  Negative for leg  swelling.   Gastrointestinal:  Negative for abdominal pain and nausea.        See HPI   Skin: Negative.    Neurological:  Negative for dizziness.   Psychiatric/Behavioral:  Negative for sleep disturbance.    All other systems reviewed and are negative.        GENERAL PHYSICAL EXAM     Vitals: BP 136/79   Pulse 60   Temp 97.3 F (36.3 C) (Infrared)   Resp 16   Ht 1.651 m (5' 5)   Wt 117.9 kg (260 lb)   SpO2 100%   BMI 43.27 kg/m  Body mass index is 43.27 kg/m.                            Physical Exam  Constitutional:       General: She is not in acute distress.  HENT:      Head: Normocephalic.      Right Ear: External ear normal.      Left Ear: External ear normal.      Nose: Nose normal.      Mouth/Throat:      Pharynx: No posterior oropharyngeal erythema.   Eyes:      General:         Right eye: No discharge.         Left eye: No discharge.   Cardiovascular:      Rate and Rhythm: Normal rate and regular rhythm.      Heart sounds: Normal heart sounds.   Pulmonary:      Breath sounds: Normal breath sounds.   Abdominal:      General: Bowel sounds are normal.      Tenderness: There is no abdominal tenderness. There is no guarding.   Neurological:  Mental Status: She is alert and oriented to person, place, and time.   Psychiatric:         Mood and Affect: Mood normal.        PROVISIONAL DIAGNOSES / SURGERY:      Elevated liver enzymes    IR liver biopsy with ultrasound    Patient Active Problem List    Diagnosis Date Noted    OSA (obstructive sleep apnea) 06/01/2022           Seleni Meller K Natha Guin, APRN - CNP on 01/24/2024 at 10:57 AM
